# Patient Record
Sex: Female | Born: 1955
Health system: Southern US, Community
[De-identification: ages and names within clinical notes are randomized; demographics above are authoritative.]

## PROBLEM LIST (undated history)

## (undated) ENCOUNTER — Emergency Department (HOSPITAL_COMMUNITY): Disposition: A | Discharge status: Home / Self Care | Payer: Medicare Other

## (undated) DIAGNOSIS — I340 Nonrheumatic mitral (valve) insufficiency: Secondary | ICD-10-CM

## (undated) DIAGNOSIS — K219 Gastro-esophageal reflux disease without esophagitis: Secondary | ICD-10-CM

## (undated) DIAGNOSIS — I10 Essential (primary) hypertension: Secondary | ICD-10-CM

## (undated) DIAGNOSIS — R413 Other amnesia: Secondary | ICD-10-CM

## (undated) DIAGNOSIS — F329 Major depressive disorder, single episode, unspecified: Secondary | ICD-10-CM

## (undated) DIAGNOSIS — I341 Nonrheumatic mitral (valve) prolapse: Secondary | ICD-10-CM

## (undated) DIAGNOSIS — D649 Anemia, unspecified: Secondary | ICD-10-CM

## (undated) DIAGNOSIS — F419 Anxiety disorder, unspecified: Secondary | ICD-10-CM

## (undated) DIAGNOSIS — F319 Bipolar disorder, unspecified: Secondary | ICD-10-CM

## (undated) DIAGNOSIS — S82001A Unspecified fracture of right patella, initial encounter for closed fracture: Secondary | ICD-10-CM

## (undated) DIAGNOSIS — F32A Depression, unspecified: Secondary | ICD-10-CM

## (undated) DIAGNOSIS — M1990 Unspecified osteoarthritis, unspecified site: Secondary | ICD-10-CM

## (undated) DIAGNOSIS — M75100 Unspecified rotator cuff tear or rupture of unspecified shoulder, not specified as traumatic: Secondary | ICD-10-CM

## (undated) HISTORY — DX: Unspecified osteoarthritis, unspecified site: M19.90

## (undated) HISTORY — DX: Nonrheumatic mitral (valve) prolapse: I34.1

## (undated) HISTORY — DX: Anemia, unspecified: D64.9

## (undated) HISTORY — DX: Bipolar disorder, unspecified: F31.9

## (undated) HISTORY — DX: Major depressive disorder, single episode, unspecified: F32.9

## (undated) HISTORY — DX: Essential (primary) hypertension: I10

## (undated) HISTORY — DX: Gastro-esophageal reflux disease without esophagitis: K21.9

## (undated) HISTORY — DX: Anxiety disorder, unspecified: F41.9

## (undated) HISTORY — PX: CARDIOVASCULAR STRESS TEST: SHX262

## (undated) HISTORY — DX: Unspecified rotator cuff tear or rupture of unspecified shoulder, not specified as traumatic: M75.100

## (undated) HISTORY — DX: Depression, unspecified: F32.A

---

## 1998-03-19 ENCOUNTER — Other Ambulatory Visit: Admission: RE | Admit: 1998-03-19 | Discharge: 1998-03-19 | Payer: Self-pay | Admitting: Obstetrics and Gynecology

## 1999-02-06 ENCOUNTER — Emergency Department (HOSPITAL_COMMUNITY): Admission: EM | Admit: 1999-02-06 | Discharge: 1999-02-06 | Payer: Self-pay | Admitting: Emergency Medicine

## 2000-07-17 ENCOUNTER — Encounter: Payer: Self-pay | Admitting: Emergency Medicine

## 2000-07-17 ENCOUNTER — Emergency Department (HOSPITAL_COMMUNITY): Admission: EM | Admit: 2000-07-17 | Discharge: 2000-07-17 | Payer: Self-pay | Admitting: *Deleted

## 2001-05-21 ENCOUNTER — Encounter: Payer: Self-pay | Admitting: Family Medicine

## 2001-05-21 ENCOUNTER — Ambulatory Visit (HOSPITAL_COMMUNITY): Admission: RE | Admit: 2001-05-21 | Discharge: 2001-05-21 | Payer: Self-pay | Admitting: Family Medicine

## 2001-07-18 ENCOUNTER — Emergency Department (HOSPITAL_COMMUNITY): Admission: EM | Admit: 2001-07-18 | Discharge: 2001-07-18 | Payer: Self-pay | Admitting: Emergency Medicine

## 2002-12-15 ENCOUNTER — Other Ambulatory Visit: Admission: RE | Admit: 2002-12-15 | Discharge: 2002-12-15 | Payer: Self-pay | Admitting: Obstetrics and Gynecology

## 2003-08-01 ENCOUNTER — Emergency Department (HOSPITAL_COMMUNITY): Admission: AD | Admit: 2003-08-01 | Discharge: 2003-08-01 | Payer: Self-pay | Admitting: Emergency Medicine

## 2003-09-07 ENCOUNTER — Encounter (INDEPENDENT_AMBULATORY_CARE_PROVIDER_SITE_OTHER): Payer: Self-pay | Admitting: Specialist

## 2003-09-07 ENCOUNTER — Ambulatory Visit (HOSPITAL_COMMUNITY): Admission: RE | Admit: 2003-09-07 | Discharge: 2003-09-07 | Payer: Self-pay | Admitting: Otolaryngology

## 2003-09-07 ENCOUNTER — Ambulatory Visit (HOSPITAL_BASED_OUTPATIENT_CLINIC_OR_DEPARTMENT_OTHER): Admission: RE | Admit: 2003-09-07 | Discharge: 2003-09-07 | Payer: Self-pay | Admitting: Otolaryngology

## 2003-09-07 HISTORY — PX: TONSILLECTOMY: SUR1361

## 2003-09-25 ENCOUNTER — Observation Stay (HOSPITAL_COMMUNITY): Admission: EM | Admit: 2003-09-25 | Discharge: 2003-09-25 | Payer: Self-pay | Admitting: Emergency Medicine

## 2003-09-25 HISTORY — PX: OTHER SURGICAL HISTORY: SHX169

## 2004-03-25 ENCOUNTER — Ambulatory Visit: Payer: Self-pay | Admitting: Internal Medicine

## 2004-04-11 ENCOUNTER — Ambulatory Visit: Payer: Self-pay | Admitting: Internal Medicine

## 2004-05-02 ENCOUNTER — Ambulatory Visit: Payer: Self-pay | Admitting: Internal Medicine

## 2004-11-03 ENCOUNTER — Ambulatory Visit: Payer: Self-pay | Admitting: Internal Medicine

## 2005-03-02 ENCOUNTER — Ambulatory Visit: Payer: Self-pay | Admitting: Internal Medicine

## 2005-07-21 ENCOUNTER — Ambulatory Visit: Payer: Self-pay | Admitting: Family Medicine

## 2005-08-03 ENCOUNTER — Ambulatory Visit: Payer: Self-pay | Admitting: Family Medicine

## 2005-08-10 ENCOUNTER — Encounter: Admission: RE | Admit: 2005-08-10 | Discharge: 2005-08-10 | Payer: Self-pay | Admitting: Family Medicine

## 2005-08-17 ENCOUNTER — Ambulatory Visit: Payer: Self-pay | Admitting: Family Medicine

## 2005-10-11 ENCOUNTER — Ambulatory Visit: Payer: Self-pay | Admitting: Family Medicine

## 2005-11-08 ENCOUNTER — Ambulatory Visit: Payer: Self-pay | Admitting: Family Medicine

## 2005-12-06 ENCOUNTER — Ambulatory Visit: Payer: Self-pay | Admitting: Family Medicine

## 2006-05-26 ENCOUNTER — Inpatient Hospital Stay (HOSPITAL_COMMUNITY): Admission: EM | Admit: 2006-05-26 | Discharge: 2006-05-28 | Payer: Self-pay | Admitting: Emergency Medicine

## 2006-06-11 ENCOUNTER — Ambulatory Visit: Payer: Self-pay

## 2006-07-23 ENCOUNTER — Ambulatory Visit: Payer: Self-pay | Admitting: Cardiovascular Disease

## 2006-08-06 ENCOUNTER — Encounter: Payer: Self-pay | Admitting: Cardiology

## 2006-08-06 ENCOUNTER — Ambulatory Visit: Payer: Self-pay

## 2006-08-09 ENCOUNTER — Ambulatory Visit: Payer: Self-pay | Admitting: Cardiovascular Disease

## 2006-10-27 DIAGNOSIS — F411 Generalized anxiety disorder: Secondary | ICD-10-CM | POA: Insufficient documentation

## 2006-10-27 DIAGNOSIS — M67919 Unspecified disorder of synovium and tendon, unspecified shoulder: Secondary | ICD-10-CM | POA: Insufficient documentation

## 2006-10-27 DIAGNOSIS — F319 Bipolar disorder, unspecified: Secondary | ICD-10-CM | POA: Insufficient documentation

## 2006-10-27 DIAGNOSIS — F17200 Nicotine dependence, unspecified, uncomplicated: Secondary | ICD-10-CM | POA: Insufficient documentation

## 2006-10-27 DIAGNOSIS — M719 Bursopathy, unspecified: Secondary | ICD-10-CM

## 2006-10-27 DIAGNOSIS — M19049 Primary osteoarthritis, unspecified hand: Secondary | ICD-10-CM | POA: Insufficient documentation

## 2006-10-27 DIAGNOSIS — K219 Gastro-esophageal reflux disease without esophagitis: Secondary | ICD-10-CM | POA: Insufficient documentation

## 2006-11-12 ENCOUNTER — Encounter: Admission: RE | Admit: 2006-11-12 | Discharge: 2006-12-13 | Payer: Self-pay | Admitting: Specialist

## 2007-01-21 ENCOUNTER — Emergency Department (HOSPITAL_COMMUNITY): Admission: EM | Admit: 2007-01-21 | Discharge: 2007-01-21 | Payer: Self-pay | Admitting: *Deleted

## 2007-03-25 ENCOUNTER — Ambulatory Visit: Payer: Self-pay

## 2007-03-29 ENCOUNTER — Ambulatory Visit: Payer: Self-pay | Admitting: Cardiovascular Disease

## 2007-12-18 ENCOUNTER — Emergency Department (HOSPITAL_COMMUNITY): Admission: EM | Admit: 2007-12-18 | Discharge: 2007-12-18 | Payer: Self-pay | Admitting: Emergency Medicine

## 2009-04-09 ENCOUNTER — Encounter: Admission: RE | Admit: 2009-04-09 | Discharge: 2009-04-09 | Payer: Self-pay | Admitting: Nurse Practitioner

## 2009-04-15 DIAGNOSIS — Z8679 Personal history of other diseases of the circulatory system: Secondary | ICD-10-CM | POA: Insufficient documentation

## 2009-04-15 DIAGNOSIS — I10 Essential (primary) hypertension: Secondary | ICD-10-CM | POA: Insufficient documentation

## 2009-04-15 DIAGNOSIS — I080 Rheumatic disorders of both mitral and aortic valves: Secondary | ICD-10-CM

## 2009-04-19 ENCOUNTER — Ambulatory Visit: Payer: Self-pay | Admitting: Cardiovascular Disease

## 2009-05-14 ENCOUNTER — Encounter: Admission: RE | Admit: 2009-05-14 | Discharge: 2009-05-14 | Payer: Self-pay | Admitting: Orthopedic Surgery

## 2009-05-18 ENCOUNTER — Telehealth (INDEPENDENT_AMBULATORY_CARE_PROVIDER_SITE_OTHER): Payer: Self-pay | Admitting: *Deleted

## 2009-05-19 ENCOUNTER — Inpatient Hospital Stay (HOSPITAL_COMMUNITY): Admission: RE | Admit: 2009-05-19 | Discharge: 2009-05-26 | Payer: Self-pay | Admitting: Orthopedic Surgery

## 2009-05-19 HISTORY — PX: OTHER SURGICAL HISTORY: SHX169

## 2009-05-19 HISTORY — PX: ANTERIOR CERVICAL DECOMP/DISCECTOMY FUSION: SHX1161

## 2009-05-24 ENCOUNTER — Ambulatory Visit: Payer: Self-pay | Admitting: Physical Medicine & Rehabilitation

## 2009-06-04 ENCOUNTER — Inpatient Hospital Stay (HOSPITAL_COMMUNITY): Admission: EM | Admit: 2009-06-04 | Discharge: 2009-06-07 | Payer: Self-pay | Admitting: Emergency Medicine

## 2009-09-02 ENCOUNTER — Encounter: Admission: RE | Admit: 2009-09-02 | Discharge: 2009-12-01 | Payer: Self-pay | Admitting: Orthopedic Surgery

## 2009-12-02 ENCOUNTER — Encounter
Admission: RE | Admit: 2009-12-02 | Discharge: 2010-01-26 | Payer: Self-pay | Source: Home / Self Care | Admitting: Specialist

## 2010-06-08 ENCOUNTER — Telehealth: Payer: Self-pay | Admitting: Cardiovascular Disease

## 2010-06-15 ENCOUNTER — Ambulatory Visit: Payer: Self-pay | Admitting: Cardiovascular Disease

## 2010-06-16 ENCOUNTER — Ambulatory Visit: Payer: Self-pay

## 2010-06-16 ENCOUNTER — Telehealth: Payer: Self-pay | Admitting: Cardiovascular Disease

## 2010-06-16 ENCOUNTER — Encounter: Payer: Self-pay | Admitting: Cardiovascular Disease

## 2010-06-22 ENCOUNTER — Telehealth: Payer: Self-pay | Admitting: Cardiovascular Disease

## 2010-06-22 ENCOUNTER — Ambulatory Visit (HOSPITAL_COMMUNITY)
Admission: RE | Admit: 2010-06-22 | Discharge: 2010-06-22 | Payer: Self-pay | Source: Home / Self Care | Attending: Cardiology | Admitting: Cardiology

## 2010-06-28 ENCOUNTER — Ambulatory Visit: Admit: 2010-06-28 | Payer: Self-pay | Admitting: Thoracic Surgery (Cardiothoracic Vascular Surgery)

## 2010-06-28 ENCOUNTER — Ambulatory Visit (HOSPITAL_COMMUNITY)
Admission: RE | Admit: 2010-06-28 | Discharge: 2010-06-28 | Payer: Self-pay | Source: Home / Self Care | Attending: Cardiovascular Disease | Admitting: Cardiovascular Disease

## 2010-06-30 ENCOUNTER — Ambulatory Visit: Admit: 2010-06-30 | Payer: Self-pay

## 2010-06-30 ENCOUNTER — Ambulatory Visit (HOSPITAL_COMMUNITY): Admission: RE | Admit: 2010-06-30 | Payer: Self-pay | Source: Home / Self Care | Admitting: Cardiovascular Disease

## 2010-07-07 ENCOUNTER — Ambulatory Visit: Admit: 2010-07-07 | Payer: Self-pay | Admitting: Cardiovascular Disease

## 2010-07-16 ENCOUNTER — Encounter: Payer: Self-pay | Admitting: Family Medicine

## 2010-07-28 NOTE — Progress Notes (Signed)
Summary: when can pt go back to work  Phone Note Call from Patient Call back at [employer] [phone]   Caller: Patient Reason for Call: Talk to Nurse, Talk to Doctor Summary of Call: pt is having a valve repair/replace and she wants to know how long recovery will be she wants to go back to work asap Initial call taken by: Omer Jack,  June 08, 2010 9:01 AM  Follow-up for Phone Call        I spoke with the pt and she is not scheduled for surgery and has not seen a surgeon.  The pt said Dr Mayford Knife told her that she needs to have surgery based off of testing after her spine surgery.  I made the pt aware that our office does not have any records from Dr Mayford Knife or her hospitalizations.  The pt has a scheduled appt with Dr Excell Seltzer on 06/15/10 and I instructed her to obtain her records for Dr Excell Seltzer to review at appt.  I also made the pt aware that Dr Excell Seltzer does not perform valve surgery and that she would be referred to a Cardiac surgeon if she needed further evaluation of her valve.   Follow-up by: Julieta Gutting, RN, BSN,  June 08, 2010 9:23 AM

## 2010-07-28 NOTE — Letter (Signed)
Summary: TEE Instructions  [facility], Main Office  [address]   [place], [place] [address]   Phone: [phone]  Fax: [phone]      TEE Instructions  06/15/2010 MRN: [id number]  Nichole Gibbs [address] [place], [place]  [address]      You are scheduled for a TEE on Thursday June 16, 2010 with Dr. Jens Som.  Please arrive at the [facility] at 12:00 noon on the day of your procedure.  1)   Diet:     A)   May have clear liquid breakfast .  Clear liquids include:  water, broth,        Sprite, Ginger Ale, black cofee, tea (no sugar), cranberry / grape /        apple juice, jello (not red), popsicle from clear juices (not red).  No        solid food after midnight, you can have clear liquids until 7:00 am.  2)  Must have a responsible person to drive you home.  3)   Bring your current insurance cards and current list of all your medications.   *Special Note:  Every effort is made to have your procedure done on time.  Occasionally there are emergencies that present themselves at the hospital that may cause delays.  Please be patient if a delay does occur.  *If you have any questions after you get home, please call the office at [phone].

## 2010-07-28 NOTE — Progress Notes (Signed)
Summary: question regarding office note  Phone Note From Other Clinic   Caller: dee from dr. Barry Dienes office # [phone] Request: Talk with Nurse Summary of Call: has question regarding office note they receive on yesterday.  Initial call taken by: Lorne Skeens,  June 16, 2010 11:20 AM  Follow-up for Phone Call        I talked with PCCs--they are working on referral to Dr Cornelius Moras and it will be made today

## 2010-07-28 NOTE — Assessment & Plan Note (Signed)
Summary: f1y   Visit Type:  Follow-up Primary Provider:  Venita Lick, M.D.  CC:  shortness of breath.  History of Present Illness: 55 year-old woman presents for followup of mitral valve prolapse with mitral regurgitation. She complains of progressive dyspnea with exertion, now with Class 3 symptoms. She reports shortness of breath with walking on level ground at less than 100 feet. Denies orthopnea or PND, but reports shortness of breath lying on her right side. No edema. She has fleeting chest pains.  Her last echo was in 2008 and showed mild MVP with mild MR. LV size and systolic function were normal.  Current Medications (verified): 1)  Wellbutrin Xl 300 Mg Xr24h-Tab (Bupropion Hcl) .... Take 1 Tablet By Mouth Once A Day 2)  Clonazepam 0.5 Mg Tbdp (Clonazepam) .... As Needed 3)  Lortab 5 5-500 Mg Tabs (Hydrocodone-Acetaminophen) .... 4-5 Times A Day 4)  Lyrica 50 Mg Caps (Pregabalin) .Marland Kitchen.. 1 By Mouth Once Daily 5)  Aspirin 325 Mg  Tabs (Aspirin) .Marland Kitchen.. 1 By Mouth Once Daily  Allergies: No Known Drug Allergies  Past History:  Past medical history reviewed for relevance to current acute and chronic problems.  Past Medical History: Reviewed history from 04/19/2009 and no changes required. Current Problems:  HYPERTENSION, UNSPECIFIED (ICD-401.9) MITRAL REGURGITATION (ICD-396.3), mild by echo 2008 MITRAL VALVE PROLAPSE, HX OF (ICD-V12.50) ANXIETY (ICD-300.00) GERD (ICD-530.81) DSORD BIPOLAR I, UNSPC, MOST RECENT EPSD (ICD-296.7) OSTEOARTHRITIS, HANDS, BILATERAL (ICD-715.94) TOBACCO ABUSE (ICD-305.1) SYNDROME, ROTATOR CUFF NOS (ICD-726.10) Chest pain syndrome with negative nuclear stress test 2007  Review of Systems       Negative except as per HPI   Vital Signs:  Patient profile:   55 year old female Height:      62 inches Weight:      117 pounds BMI:     21.48 Pulse rate:   70 / minute Pulse rhythm:   regular Resp:     16 per minute BP sitting:   158 / 102  (left  arm) Cuff size:   regular  Vitals Entered By: Marrion Coy, CNA (June 15, 2010 11:55 AM)  Physical Exam  General:  Pt is alert and oriented, in no acute distress. HEENT: normal Neck: normal carotid upstrokes without bruits, JVP normal Lungs: CTA CV: RRR with a 3/6 holosystolic murmur at the apex Abd: soft, NT, positive BS, no bruit, no organomegaly Ext: no clubbing, cyanosis, or edema. peripheral pulses 2+ and equal Skin: warm and dry without rash    EKG  Procedure date:  06/15/2010  Findings:      NSR 68 bpm, biatrial enlargement.  Impression & Recommendations:  Problem # 1:  MITRAL REGURGITATION (ICD-396.3) The patient has MVP with mitral regurgitation. She has a very strong desire to have her valve repaired as she is fearful of heart failure and other long-term sequelae of MR. She has not had an echo since 2008 - that study was reviewed and showed only mild MR. Her exam is now consistent with at least moderate MR. I have recommended both a 2D echo and a TEE to assess valve morphology. I am going to refer her to Dr Cornelius Moras for discussion of risk/benefit of early repair versus ongoing observation and medical therapy. Her noninvasive studies may show progressive MR and in the presence of exertional dyspnea we would recommend repair. If her MR remains 2+, I would be inclined to continue with med Rx and surveillance.  Orders: Echocardiogram (Echo) TCTS Referral (TCTS Ref) EKG w/ Interpretation ([address])  Trans Esophageal Echocardiogram (TEE)  Problem # 2:  HYPERTENSION, UNSPECIFIED (ICD-401.9)  BP significantly elevated today. She has been intolerant to various meds in the past. Will try losartan 100 mg daily.  Her updated medication list for this problem includes:    Aspirin 325 Mg Tabs (Aspirin) .Marland Kitchen... 1 by mouth once daily    Losartan Potassium 100 Mg Tabs (Losartan potassium) .Marland Kitchen... Take one tablet by mouth daily  Orders: Echocardiogram (Echo) TCTS Referral (TCTS Ref) EKG  w/ Interpretation ([address]) Trans Esophageal Echocardiogram (TEE)  BP today: 158/102 Prior BP: 128/80 (04/19/2009)  Her updated medication list for this problem includes:    Aspirin 325 Mg Tabs (Aspirin) .Marland Kitchen... 1 by mouth once daily    Losartan Potassium 100 Mg Tabs (Losartan potassium) .Marland Kitchen... Take one tablet by mouth daily  Patient Instructions: 1)  Your physician wants you to follow-up in: 1 YEAR.  You will receive a reminder letter in the mail two months in advance. If you don't receive a letter, please call our office to schedule the follow-up appointment. 2)  You have been referred to Dr Cornelius Moras with TCTS.  3)  Your physician has requested that you have an echocardiogram.  Echocardiography is a painless test that uses sound waves to create images of your heart. It provides your doctor with information about the size and shape of your heart and how well your heart's chambers and valves are working.  This procedure takes approximately one hour. There are no restrictions for this procedure. 4)  Your physician has requested that you have a TEE.  During a TEE, sound waves are used to create images of your heart. It provides your doctor with information about the size and shape of your heart and how well your heart's chambers and valves are working. In this test, a transducer is attached to the end of a flexible tube that's guided down your throat and into your esophagus (the tube leading from your mouth to your stomach) to get a more detailed image of your heart. You are not awake for the procedure. Please see the instruction sheet given to you today.  For further information please visit [email or web address] 5)  Your physician has recommended you make the following change in your medication: START Losartan 100mg  once a day Prescriptions: LOSARTAN POTASSIUM 100 MG TABS (LOSARTAN POTASSIUM) take one tablet by mouth daily  #30 x 6   Entered by:   Julieta Gutting, RN, BSN   Authorized by:   Norva Karvonen, MD   Signed by:   Julieta Gutting, RN, BSN on 06/15/2010   Method used:   Electronically to        [facility]. [phone]* (retail)       [address]       [place], [place]  [address]       Ph: [phone]       Fax: [phone]   RxID:   [id number]

## 2010-07-28 NOTE — Progress Notes (Signed)
Summary: pt was not aware of tee for today at [facility]  Phone Note From Other Clinic   Caller: debbie from [facility] [phone] Request: Talk with Nurse Summary of Call: pt was not aware of tee that was scheudle today with dr.crenshaw. pt will need to be r.s  Initial call taken by: Lorne Skeens,  June 22, 2010 11:44 AM  Follow-up for Phone Call        Spoke with Lynden Ang at [facility] regarding the pt. not being aware of having the TEE scheduled for today at 12:00 noon.TEE needs to be re-schedule. According to a written note by Katina Dung, pt. was aware of TEE been done on 06/22/10. When I talked to the pt. she states was confused because, Marlowe Kays the scheduler called her to let her know she was scheduled for an echo on Jan.3rd at 1:00 PM. I let pt. know that  Dr. Excell Seltzer order a TEE and a 2-D echo. Pt. is scheduled to see Dr. Barry Dienes also same day of echo at 3:00PM. Pt. will call back to see when she can get a ride to schedule the TEE.  Follow-up by: Ollen Gross, RN, BSN,  June 22, 2010 12:29 PM  Additional Follow-up for Phone Call Additional follow up Details #1::        The patient needs to have the TEE done before she sees Dr Cornelius Moras. thx Additional Follow-up by: Norva Karvonen, MD,  June 23, 2010 1:53 PM     Appended Document: pt was not aware of tee for today at [facility] pt believes that the tee is scheduled for 1/3 at 1pm but we show the 2d scheduled at that time. did adv pt that needs to have TEE done before seeing Dr. Cornelius Moras. scheduled for 1/3 10:00 am Claris Gladden, RN, BSN    Appended Document: pt was not aware of tee for today at [facility] reviewed 1/3 appointments w/pt and she expressed understanding. 10:00 TEE 1:00  2d 3:00 Dr. Deeann Cree, RN, BSN

## 2010-07-28 NOTE — Letter (Signed)
Summary: TEE Instructions  [facility], Main Office  [address]   [place], [place] [address]   Phone: [phone]  Fax: [phone]      TEE Instructions  06/16/2010 MRN: [id number]  QUANETTA TRUSS [address] [place], [place]  [address]      You are scheduled for a TEE on  Wednesday December 28 with Dr. Olga Millers.   Please arrive at the [facility] at 11 a.m. on the day of your procedure.  1)   Diet:     A)   Nothing to eat or drink after midnight except your medications with        a sip of water.     2)  Must have a responsible person to drive you home.  3)   Bring your current insurance cards and current list of all your medications.   *Special Note:  Every effort is made to have your procedure done on time.  Occasionally there are emergencies that present themselves at the hospital that may cause delays.  Please be patient if a delay does occur.  *If you have any questions after you get home, please call the office at [phone].

## 2010-07-28 NOTE — Progress Notes (Signed)
Summary: pt needs to rsc procedure  Phone Note Call from Patient Call back at Home Phone [phone]   Caller: Patient Reason for Call: Talk to Nurse, Talk to Doctor Summary of Call: pt had to cancel procedure this morning due to pressure being to high so she needs to rsc Initial call taken by: Omer Jack,  June 16, 2010 10:39 AM  Follow-up for Phone Call        I talked with pt--she stated the hospital called her to reschedule to TEE to earlier an earlier time today--pt states she was unable to arrange earlier transportation-she was told the TEE would need to be rescheduled--I have rescheduled the TEE to 06/22/10 at 12n with Dr Daniel Nones to arrive at 11am--Booking # [id number]--YN is aware and verbalized understanding

## 2010-08-18 ENCOUNTER — Telehealth: Payer: Self-pay | Admitting: Cardiovascular Disease

## 2010-08-23 NOTE — Progress Notes (Signed)
Summary: chest pain/l arm pain  Phone Note Call from Patient   Caller: Patient [phone] Reason for Call: Talk to Nurse Summary of Call: pt calling to set up an external [place] prior to having heart surgery, I have no order for that, also while talking with her she was having chest pain-and pain in left arm-she didn't want me to leave this message due to maybe being asked to go to er, she states she took an aspirin, is drinking warm water and is sitting down, but I  felt more comfortable letting you know-pls advise Initial call taken by: Glynda Jaeger,  August 18, 2010 11:18 AM  Follow-up for Phone Call        I spoke with the pt and made her aware that since she had TEE she did not need Echo.  I spoke with the pt about the syptoms she had this morning and she said they had gone away and she felt fine now.  The pt said she had really bad CP, left arm pain, diaphoresis, and her finger tips turned blue.  This lasted about 4-5 minutes.  The pt said she sat down, drank warm water and took an ASA and this gave her relief of symptoms.  The pt's last episode like this was one year ago. I tried to arrange an appt for the pt to be seen tomorrow but she cannot come into the office until March due to financial reasons. The pt also did not want to see the PA.   Appt made on 09/13/10 at 2:30 with Dr Excell Seltzer.  The pt will call the office if she has any other symptoms.  I further instructed the pt to go to the ER for evaluation if symptoms worsen or change. Pt agreed.  Follow-up by: Julieta Gutting, RN, BSN,  August 18, 2010 1:36 PM

## 2010-08-30 ENCOUNTER — Encounter: Payer: Self-pay | Admitting: Cardiovascular Disease

## 2010-09-13 ENCOUNTER — Ambulatory Visit: Payer: Self-pay | Admitting: Cardiovascular Disease

## 2010-09-16 ENCOUNTER — Ambulatory Visit (INDEPENDENT_AMBULATORY_CARE_PROVIDER_SITE_OTHER): Payer: Medicare Other | Admitting: Psychology

## 2010-09-16 DIAGNOSIS — F4310 Post-traumatic stress disorder, unspecified: Secondary | ICD-10-CM

## 2010-09-27 ENCOUNTER — Encounter (INDEPENDENT_AMBULATORY_CARE_PROVIDER_SITE_OTHER): Payer: Medicare Other | Admitting: Psychology

## 2010-09-27 DIAGNOSIS — F4310 Post-traumatic stress disorder, unspecified: Secondary | ICD-10-CM

## 2010-09-28 LAB — URINALYSIS, ROUTINE W REFLEX MICROSCOPIC
Glucose, UA: NEGATIVE mg/dL
Leukocytes, UA: NEGATIVE
Nitrite: NEGATIVE
Specific Gravity, Urine: 1.014 (ref 1.005–1.030)
Urobilinogen, UA: 0.2 mg/dL (ref 0.0–1.0)
pH: 8 (ref 5.0–8.0)

## 2010-09-28 LAB — CBC
HCT: 37.2 % (ref 36.0–46.0)
MCHC: 34.3 g/dL (ref 30.0–36.0)
MCV: 91.8 fL (ref 78.0–100.0)
Platelets: 184 10*3/uL (ref 150–400)
RDW: 14 % (ref 11.5–15.5)
WBC: 4.8 10*3/uL (ref 4.0–10.5)

## 2010-09-28 LAB — BASIC METABOLIC PANEL
BUN: 8 mg/dL (ref 6–23)
CO2: 28 mEq/L (ref 19–32)
Chloride: 107 mEq/L (ref 96–112)
GFR calc Af Amer: >60 mL/min (ref >60)
GFR calc non Af Amer: >60 mL/min (ref >60)
Glucose, Bld: 92 mg/dL (ref 70–99)
Potassium: 4.2 mEq/L (ref 3.5–5.1)

## 2010-09-28 LAB — URINE MICROSCOPIC-ADD ON

## 2010-09-28 LAB — PROTIME-INR
INR: 1.04 (ref 0.00–1.49)
Prothrombin Time: 13.5 seconds (ref 11.6–15.2)

## 2010-10-11 ENCOUNTER — Encounter (HOSPITAL_COMMUNITY): Payer: Medicare Other | Admitting: Psychology

## 2010-11-08 NOTE — Assessment & Plan Note (Signed)
[facility]                            CARDIOLOGY OFFICE NOTE   [name], [name]                   MRN:          [id number]  DATE:03/29/2007                            DOB:          23-Apr-1956    Nichole Gibbs returns for followup at the [facility]iology Office  on March 29, 2007.  She is a 55 year old woman with mitral valve  prolapse and mitral regurgitation.  She has had problems with chest pain  in the past and she was evaluated with a Myoview study in December of  2007 which was normal.  Symptomatically she is doing quite well.  She is  taking atenolol on a sporadic basis.  She has had no further problems  with chest pain or dyspnea.  She has tried to increase her exercise and  she can walk up 6 flights of stairs without stopping.  She has worked up  to this since her last visit.  She is doing this multiple times daily.  She has no symptoms with that level of activity.  She does have some  occasional resting palpitations but this has been better since she has  been on atenolol.   CURRENT MEDICATIONS:  1. Wellbutrin 150 mg daily.  2. Multivitamin daily.  3. Diazepam as needed.  4. Atenolol, unsure of the dose.   ALLERGIES:  NKDA.   PHYSICAL EXAMINATION:  The patient is alert and oriented, she is in no  acute distress.  Weight is 117 pounds, blood pressure 120/70, heart rate  68, respiratory rate 12.  HEENT:  Normal.  NECK:  Normal carotid upstrokes without bruits, jugular venous pressure  is normal.  LUNGS:  Clear to auscultation bilaterally.  HEART:  The apex is discreet and nondisplaced.  The heart is regular  rate and rhythm.  There is a 2/6 mid systolic murmur at the apex.  His  S2 is physiologic.  ABDOMEN:  Soft, nontender, no organomegaly.  EXTREMITIES:  No clubbing, cyanosis or edema.   ASSESSMENT:  Nichole Gibbs is stable from a cardiovascular standpoint.  Her chest pain has resolved.  She has mitral valve  prolapse with mitral  regurgitation.  This is stable by exam.  I will plan on following her on  a yearly basis.  She has had trouble tolerating atenolol on a regular  basis and I have asked her to take 1/2 of a pill every day so that she  stays on a consistent dose.     [name]. Excell Seltzer, MD  Electronically Signed    MDC/MedQ  DD: 03/29/2007  DT: 03/30/2007  Job #: [id number]

## 2010-11-11 NOTE — Assessment & Plan Note (Signed)
[facility]                            CARDIOLOGY OFFICE NOTE   [name], Nichole                   MRN:          [id number]  DATE:07/23/2006                            DOB:          04/16/1956    Nichole Gibbs is a very nice 55 year old African American woman who  presents in-hospital followup after an admission for chest pain back in  December 2007.  Nichole Gibbs presented with chest pain and palpitations  and was ruled out for a myocardial infarction.  She underwent an  outpatient exercise Myoview stress study in our office on December 17 of  last year, which demonstrated normal myocardial perfusion at rest and  with exertion.  The patient's ejection fraction is estimated at 65%.  There was upsloping ST depression that did not make criteria for a  significant finding.   Since discharge Nichole Gibbs has been doing well from a symptomatic  standpoint; however, she has been fearful of doing any exercise, as she  was concerned about her risk for having a heart attack.  She had  previously been engaged in regular exercise with working out for at  least 30 minutes three times a week until her admission in December.  She does have occasional palpitations and has had approximately 2  episodes of palpitations since her discharge home.  She has also had a  few episodes of chest tightness that she relates to anxiety.  She has  had no exertional chest pain.  She complains of mild dyspnea with  exertion, but thinks this is because she is out of shape.  She denies  light headedness, syncope, orthopnea, PND or edema.  She has no other  complaints at this time.   CURRENT MEDICINES:  1. Clonazepam 1 mg b.i.d.  2. Fish oil 1000 units q.i.d.  3. Aspirin 325 mg daily.   She has also been prescribed Niacin and Wellbutrin but has not taken  these medicines.   ALLERGIES:  NKDA.   PAST MEDICAL HISTORY:  Pertinent past medical problems are as  follows:  1. Depression and anxiety disorder.  2. Tobacco abuse.  3. Cesarean section in 1989.  4. Upper GI endoscopy in 1979.   She denies any other surgeries or hospitalizations in the past and has  had no other medical problems.   FAMILY HISTORY:  Patient's mother died at age 65 of complications from  coronary artery disease and congestive heart failure.  Her father died  at age 39 of myocardial infarction.  She has 2 sisters and 2 brothers  with no coronary artery disease.  One sister has severe hypertension.   SOCIAL HISTORY:  The patient is divorced.  She has 3 children.  She has  a 55 year old who lives at home with her.  She smokes 1 pack of  cigarettes daily since age 74.  She drinks approximately 2 beers in the  evening that helps her sleep.  She does not use any recreational drugs.   REVIEW OF SYSTEMS:  A complete 12 point review of systems was performed  Pertinent positives included seasonal allergies, anemia, constipation,  fatigue,  gastroesophageal reflux, occasional hot flashes, anxiety and  depression.  All other systems were reviewed and are negative except as  described above.   PHYSICAL EXAMINATION:  The patient is alert and oriented.  She is in no  acute distress.  Her weight is 117 pounds.  Blood pressure is 98/62.  Heart rate 76.  Respiratory rate is 12.  HEENT:  Normal.  NECK:  Normal carotid upstrokes without bruits.  Jugular venous pressure  is normal.  There is no thyromegaly or thyroid nodules.  LUNGS:  Are clear to auscultation bilaterally.  CARDIAC:  The apex is discrete and nondisplaced.  There is no right  ventricular heave or lift.  The heart is regular rate and rhythm.  There  is physiologic splitting of S2.  There is a 3/6 holosystolic murmur at  the apex.  There are no diastolic murmurs or gallops present.  ABDOMEN:  Is soft, nontender, no abdominal bruits.  No organomegaly.  EXTREMITIES:  No clubbing, cyanosis or edema.  Peripheral pulses are  2+  and equal throughout.  SKIN:  Is warm and dry without rash.  NEUROLOGIC:  Is grossly intact.  Cranial nerves II through XII are  intact.  Strength is 5/5 and equal in the arms and legs bilaterally.   EKG demonstrates normal sinus rhythm with left atrial enlargement.  There are no ST segment or T-wave changes present.   LABORATORY DATA:  Reviewed from her hospitalization demonstrated a  normocytic anemia with a hemoglobin on admission of 12.6 and follow up  hemoglobin of 10.9.  The patient's electrolytes and creatinine were  normal.  Cardiac enzymes were negative x3 sets.  Lipid panel showed a  total cholesterol of 185 with the triglycerides of 30 and HDL  cholesterol of 103 and an LDL cholesterol of 76.   TSH was 1.23.   ASSESSMENT:  Nichole Gibbs is a 55 year old woman with the following  cardiovascular problems:  1. Chest pain.  In reviewing her hospital records as well as her      stress test, she has no evidence of prior myocardial infarction or      myocardial ischemia.  Her stress test results were reassuring.  She      is not having any significant symptoms at this point and I      reassured her in this regard.  I encouraged her to get back to her      regular exercise activities.  She can discontinue aspirin, as I do      not think she requires this medicine for any long term benefit.      Her lipid panel is excellent and it is okay for her to continue      Fish oil but there is no need for her to be on Niacin, as she      actually has a very high HDL cholesterol.  2. Heart murmur. Her murmur sounds consistent with mitral      insufficiency.  I have ordered an      echocardiogram and we will follow up with her after the results of      her echo are complete.  3. Followup.  We will follow up with Nichole Gibbs again after her      echo is complete.     [name]. Excell Seltzer, MD  Electronically Signed   MDC/MedQ  DD: 07/23/2006  DT: 07/23/2006  Job #: [id number]

## 2010-11-11 NOTE — Assessment & Plan Note (Signed)
[facility]                            CARDIOLOGY OFFICE NOTE   Nichole Gibbs, Nichole Gibbs                   MRN:          [id number]  DATE:08/09/2006                            DOB:          11/29/1955    Nichole Gibbs returns for followup on August 09, 2006 as an  outpatient at the [facility].  She is a 55 year old  African-American woman who has had chest pain, palpitations, and  shortness of breath.  She has undergone an exercise Myoview that  demonstrated normal myocardial perfusion at rest and with exertion with  a preserved left ventricular ejection fraction of 65%.  Since her  initial evaluation here on January 28, she has not had any further chest  pain.  She continues to have some dyspnea with exertion.  She has no  other new complaints.   She has undergone a transthoracic echocardiogram, which was done on  February 11.  It demonstrated normal left ventricular size and systolic  function with borderline LVH.  She has mild mitral valve prolapse with  mild to moderate mitral regurgitation.  She also had trivial aortic  insufficiency and trivial tricuspid insufficiency.  There were no other  significant abnormalities seen.   CURRENT MEDICATIONS:  1. Clonazepam 1 mg b.i.d.  2. Fish oil 1000 units q.i.d.  3. Aspirin 325 mg daily.  4. Niacin has been discontinued.  5. Wellbutrin is being titrated for smoking cessation.  6. Daily multivitamin.   ALLERGIES:  NKDA.   PHYSICAL EXAMINATION:  The patient is alert and oriented.  She is in no  acute distress.  Blood pressure is 121/80, heart rate 78, weight 115 pounds.  Respiratory  rate is 12.  HEENT:  Normal.  NECK:  Normal carotid upstrokes without bruits.  Jugular venous pressure  normal.  LUNGS:  Clear to auscultation bilaterally.  HEART:  PMI is discrete and not displaced.  The heart is regular rate  and rhythm.  There is a 2/6 holosystolic murmur at the apex.  There  are  no gallops.  There are no diastolic murmurs.  ABDOMEN:  Soft and nontender.  EXTREMITIES:  No cyanosis, clubbing, or edema.   ASSESSMENT:  Nichole Gibbs is a 55 year old woman with mitral valve  prolapse with mitral regurgitation.  Her mitral valve prolapse may be  the etiology of her chest pain, although that is unclear.  She certainly  is low risk for coronary artery disease with her normal stress Myoview  study and history of atypical symptoms.  I have advise for SBE  prophylaxis for appropriate procedure, such as dental work.  She was  given a prescription for amoxicillin 2 g to be taken 1  hour prior to such procedures.  I have asked her to return to clinic in  6 months for a repeat exam and pending her serial examination, she may  require followup echo in 1 year.     [name]. Excell Seltzer, MD  Electronically Signed    MDC/MedQ  DD: 08/09/2006  DT: 08/09/2006  Job #: [id number]   cc:   Dr. on [facility] [place]
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
## 2010-11-11 NOTE — Discharge Summary (Signed)
[name], Nichole Gibbs          ACCOUNT NO.:  [id number]   MEDICAL RECORD NO.:  [id number]          PATIENT TYPE:  INP   LOCATION:  3711                         FACILITY:  [facility]   PHYSICIAN:  Hettie Holstein, D.O.    DATE OF BIRTH:  1955/08/24   DATE OF ADMISSION:  05/26/2006  DATE OF DISCHARGE:  05/28/2006                               DISCHARGE SUMMARY   PRIMARY CARE PHYSICIAN:  Dr. Hal Hope at [facility].   REASON FOR ADMISSION PRINCIPAL DIAGNOSIS:  Chest pain, no evidence of  acute event per cardiac markers with EKG findings of normal sinus rhythm  and left ventricular hypertrophy.   SECONDARY DIAGNOSES:  1. Anxiety.  2. History of bipolar disorder.  3. History of tobacco abuse.   MEDICATIONS ON DISCHARGE:  1. Patient is being discharged on aspirin 81 mg per day.  2. She is instructed to take Prilosec over-the-counter 20 mg daily.  3. She was formerly prescribed Nexium; however, was unable to continue      for insurance reasons.  4. Lamictal 100 mg per day.  5. Klonopin 1 mg p.o. t.i.d. p.r.n.   DISPOSITION:  At present [name] is felt to be medically stable  for discharge home.  Her symptoms have resolved, she is chest pain free.   She is instructed to follow up with [facility] for a stress  test, this is currently being arranged through [place] at this time.  She is instructed to follow up with Dr. Hal Hope at [facility] this  week for hospital follow up and instructed to call and counseled on  tobacco cessation.   HISTORY OF PRESENTING ILLNESS:  For full details, please refer to the  H&P as dictated by Dr. Oneita Hurt.  However, briefly, [name] is a  55 year old female with a history of bipolar disorder, tobacco abuse who  developed some exertional type chest discomfort, pain, left sided, that  was associated with recent escalation of stress and anxiety as she has  recently attended her mother's funeral.  In any event, she does exercise  regularly and on a routine, had been in her usual state of health.  She  was admitted to rule out acute ischemic event.  In the Emergency  Department her EKG revealed no evidence of ST abnormalities.   HOSPITAL COURSE:  The patient was admitted, her cardiac markers were  cycled as noted above.  She was followed on telemetry without evidence  of arrhythmia.  She underwent cycling of her cardiac markers which  revealed no evidence of acute ischemic event.  Her symptoms resolved and  at this time it is felt that she was stable for discharge and follow up  with [facility] for a stress test evaluation through her primary  care physician.  It may be reasonable to want to pursue a 2D  echocardiogram, as her EKG revealed LVH.      Hettie Holstein, D.O.  Electronically Signed     ESS/MEDQ  D:  05/28/2006  T:  05/28/2006  Job:  [id number]   cc:   Hal Hope, Dr.  [facility]

## 2010-11-11 NOTE — Op Note (Signed)
NAME:  Nichole Gibbs, Nichole Gibbs                      ACCOUNT NO.:  [id number]   MEDICAL RECORD NO.:  [id number]                   PATIENT TYPE:  INP   LOCATION:  1826                                 FACILITY:  [facility]   PHYSICIAN:  Kinnie Scales. Annalee Genta, M.D.            DATE OF BIRTH:  16-May-1956   DATE OF PROCEDURE:  09/25/2003  DATE OF DISCHARGE:  09/25/2003                                 OPERATIVE REPORT   PREOPERATIVE DIAGNOSES:  1. Posttonsillectomy hemorrhage.  2. Status post tonsillectomy.   POSTOPERATIVE DIAGNOSES:  1. Posttonsillectomy hemorrhage.  2. Status post tonsillectomy.   INDICATIONS FOR SURGERY:  1. Posttonsillectomy hemorrhage.  2. Status post tonsillectomy.   SURGICAL PROCEDURE:  1. Examination and cautery of posttonsillectomy hemorrhage.  2. Gastric lavage.   ANESTHESIA:  General endotracheal.   SURGEON:  Kinnie Scales. Annalee Genta, M.D.   COMPLICATIONS:  None.   ESTIMATED BLOOD LOSS:  Approximately 100 cc.   The patient was transferred from the operating room to the recovery room in  stable condition.   BRIEF HISTORY:  Ms. Barbuto is a 55 year old black female who underwent  tonsillectomy by Dr. Flo Shanks approximately three weeks ago.  She was  stable and doing well, returned to work and normal diet.  She developed  acute tonsillar hemorrhage in the evening of 09/25/03.  She presented to the  [facility] emergency department for evaluation, and given the acute  nature of her hemorrhage was taken directly to the operating room for  examination and cautery of posttonsillectomy hemorrhage site.  Prior to  surgery, the risks, benefits and possible complications of the procedure  were discussed in detail, and the patient understood and concurred with our  plan for surgery.   PROCEDURE:  The patient was brought to the operating room on 09/25/03 and  placed in the supine position on the operating table.  General endotracheal  anesthesia was established  via rapid induction technique without  complication.  There was no aspiration of blood.  With the patient's airway  secured, a Crowe-Davis mouth gag was inserted without difficulty. The  patient's oral cavity was suctioned of blood and she was found to have  active acute hemorrhage from an arterial blood vessel in the right inferior  tonsillar fossa.  The area was cauterized with suction cautery.  Tonsillar  fossa then gently abraded with a dry tonsil sponge, areas of point  hemorrhage were cauterized.  The patient's oral cavity and oropharynx were  thoroughly irrigated with saline solution.  The Crow-Davis mouth gag was  released and reapplied and there was no active bleeding.   Gastric lavage was then undertaken.  A 14 French orogastric tube was passed  without difficulty.  A moderate amount of old, bloody secretions were  suctioned from the stomach and the patient's stomach was then lavaged with  approximately 300 cc of warm saline until no further blood clots were  evident.  Orogastric tube was removed.  The patient's oral cavity and  oropharynx were again inspected. There was no active bleeding.  She was  awakened from her anesthetic, Crowe-Davis mouth gag was released and  removed, and she was extubated.  She was then transferred from the operating  room to the recovery room in stable condition.  No complications.  Estimated  blood loss approximately 100 cc.                                               Kinnie Scales. Annalee Genta, M.D.    DLS/MEDQ  D:  16/03/9603  T:  09/26/2003  Job:  [id number]

## 2010-11-11 NOTE — Op Note (Signed)
[name], Nichole Gibbs                      ACCOUNT NO.:  [id number]   MEDICAL RECORD NO.:  [id number]                   PATIENT TYPE:  AMB   LOCATION:  DSC                                  FACILITY:  [facility]   PHYSICIAN:  Zola Button T. Lazarus Salines, M.D.              DATE OF BIRTH:  1956/05/13   DATE OF PROCEDURE:  09/07/2003  DATE OF DISCHARGE:                                 OPERATIVE REPORT   PREOPERATIVE DIAGNOSES:  1. Halitosis.  2. Question chronic tonsillitis.   POSTOPERATIVE DIAGNOSES:  1. Halitosis.  2. Question chronic tonsillitis.   PROCEDURE PERFORMED:  Tonsillectomy.   SURGEON:  Gloris Manchester. Lazarus Salines, M.D.   ANESTHESIA:  General orotracheal.   ESTIMATED BLOOD LOSS:  Minimal.   COMPLICATIONS:  None.   FINDINGS:  Relatively clear anterior nose.  No residual adenoids.  Normal  soft palate.  1+ embedded, fibrotic tonsils with some cryptic debris.   PROCEDURE:  With the patient in a comfortable supine position, general  orotracheal anesthesia was induced without difficulty.  At an appropriate  level, the table was turned 90 degrees and the patient placed in  Trendelenburg.  A clean preparation and draping was accomplished.  Taking  care to protect lips, teeth, and endotracheal tube, a McIvor mouth gag was  introduced, expanded for visualization, and suspended from the Mayo stand in  the standard fashion.  The findings were as described above.  A palate  retractor and mirror were used to visualize the nasopharynx with the  findings as described above.  Finally the anterior nose was inspected with  the nasal speculum with the findings as described above.  Xylocaine 0.5%  with 1:200,000 epinephrine, 6 mL total, was infiltrated into the  peritonsillar planes for intraoperative hemostasis.  Several minutes were  allowed for this to take effect.   Beginning on the left side, the tonsil was grasped and retracted medially.  The mucosa overlying the anterior and superior poles was  coagulated and then  cut down to the capsule of the tonsil.  Using the cautery tip as a blunt  dissector, lysing fibrous bands, and coagulating crossing vessels as  identified, the tonsil was dissected free of its muscular fossa from  superiorly downward.  The tonsil was removed in its entirety as determined  by examination of both tonsil and fossa.  A small additional quantity of  cautery rendered the fossa hemostatic.  After completing the left  tonsillectomy, the right side was done in identical fashion.  After  completing both tonsillectomies and rendering the oropharynx hemostatic, the  mouth gag was relaxed for several minutes.  Upon re-expansion, hemostasis  was persistent.  At this point the procedure was completed.  The palate  retractor was relaxed and removed.  The dental status was intact.  The  patient was returned to anesthesia, awakened, extubated, and transferred to  recovery in stable condition.   COMMENT:  A 56 year old black female with persistent  halitosis and small  cryptic tonsils was the indication for today's procedure.  Anticipate a  routine postoperative recovery with attention to analgesia, antibiosis,  hydration, and observation for bleeding, emesis, or airway compromise.  Given low anticipated risk of postanesthetic or postsurgical complications,  feel an outpatient venue is appropriate.                                               Gloris Manchester. Lazarus Salines, M.D.    KTW/MEDQ  D:  09/07/2003  T:  09/07/2003  Job:  [id number]   cc:   Lorelle Formosa, M.D.  [phone] [address]  [place]  [place] [address]  Fax: [phone]

## 2011-02-07 ENCOUNTER — Other Ambulatory Visit: Payer: Self-pay | Admitting: Specialist

## 2011-02-07 ENCOUNTER — Ambulatory Visit: Payer: Medicare Other

## 2011-02-07 DIAGNOSIS — Z1231 Encounter for screening mammogram for malignant neoplasm of breast: Secondary | ICD-10-CM

## 2011-03-15 ENCOUNTER — Ambulatory Visit
Admission: RE | Admit: 2011-03-15 | Discharge: 2011-03-15 | Disposition: A | Discharge status: Home / Self Care | Payer: Medicare Other | Source: Ambulatory Visit | Attending: Specialist | Admitting: Specialist

## 2011-03-15 DIAGNOSIS — Z1231 Encounter for screening mammogram for malignant neoplasm of breast: Secondary | ICD-10-CM

## 2011-03-30 ENCOUNTER — Emergency Department (HOSPITAL_COMMUNITY)
Admission: EM | Admit: 2011-03-30 | Discharge: 2011-03-30 | Disposition: A | Discharge status: Home / Self Care | Payer: Medicare Other | Attending: Emergency Medicine | Admitting: Emergency Medicine

## 2011-03-30 DIAGNOSIS — I10 Essential (primary) hypertension: Secondary | ICD-10-CM | POA: Insufficient documentation

## 2011-03-30 DIAGNOSIS — Z79899 Other long term (current) drug therapy: Secondary | ICD-10-CM | POA: Insufficient documentation

## 2011-03-30 DIAGNOSIS — R4589 Other symptoms and signs involving emotional state: Secondary | ICD-10-CM | POA: Insufficient documentation

## 2011-03-30 DIAGNOSIS — IMO0002 Reserved for concepts with insufficient information to code with codable children: Secondary | ICD-10-CM | POA: Insufficient documentation

## 2011-03-30 DIAGNOSIS — I059 Rheumatic mitral valve disease, unspecified: Secondary | ICD-10-CM | POA: Insufficient documentation

## 2011-03-30 DIAGNOSIS — Z760 Encounter for issue of repeat prescription: Secondary | ICD-10-CM | POA: Insufficient documentation

## 2011-04-10 LAB — POCT CARDIAC MARKERS
CKMB, poc: <1 — ABNORMAL LOW
Myoglobin, poc: 68.7
Operator id: 151321
Troponin i, poc: <0.05

## 2011-04-10 LAB — POCT I-STAT CREATININE
Creatinine, Ser: 0.9
Operator id: 151321

## 2011-04-10 LAB — I-STAT 8, (EC8 V) (CONVERTED LAB)
BUN: 7
Bicarbonate: 32.2 — ABNORMAL HIGH
Chloride: 101
Glucose, Bld: 91
HCT: 45
Operator id: 151321
pCO2, Ven: 53.3 — ABNORMAL HIGH

## 2011-04-10 LAB — D-DIMER, QUANTITATIVE

## 2011-05-19 ENCOUNTER — Other Ambulatory Visit: Payer: Self-pay | Admitting: Cardiovascular Disease

## 2011-07-13 DIAGNOSIS — R112 Nausea with vomiting, unspecified: Secondary | ICD-10-CM | POA: Diagnosis not present

## 2011-07-13 DIAGNOSIS — R5381 Other malaise: Secondary | ICD-10-CM | POA: Diagnosis not present

## 2011-07-13 DIAGNOSIS — R5383 Other fatigue: Secondary | ICD-10-CM | POA: Diagnosis not present

## 2011-07-13 DIAGNOSIS — IMO0002 Reserved for concepts with insufficient information to code with codable children: Secondary | ICD-10-CM | POA: Diagnosis not present

## 2011-07-13 DIAGNOSIS — G8929 Other chronic pain: Secondary | ICD-10-CM | POA: Diagnosis not present

## 2011-07-19 DIAGNOSIS — F339 Major depressive disorder, recurrent, unspecified: Secondary | ICD-10-CM | POA: Diagnosis not present

## 2011-07-31 DIAGNOSIS — F339 Major depressive disorder, recurrent, unspecified: Secondary | ICD-10-CM | POA: Diagnosis not present

## 2011-08-14 DIAGNOSIS — F339 Major depressive disorder, recurrent, unspecified: Secondary | ICD-10-CM | POA: Diagnosis not present

## 2011-08-15 DIAGNOSIS — R5383 Other fatigue: Secondary | ICD-10-CM | POA: Diagnosis not present

## 2011-08-15 DIAGNOSIS — G8929 Other chronic pain: Secondary | ICD-10-CM | POA: Diagnosis not present

## 2011-08-15 DIAGNOSIS — IMO0002 Reserved for concepts with insufficient information to code with codable children: Secondary | ICD-10-CM | POA: Diagnosis not present

## 2011-08-15 DIAGNOSIS — R5381 Other malaise: Secondary | ICD-10-CM | POA: Diagnosis not present

## 2011-08-28 DIAGNOSIS — F339 Major depressive disorder, recurrent, unspecified: Secondary | ICD-10-CM | POA: Diagnosis not present

## 2011-09-08 DIAGNOSIS — F339 Major depressive disorder, recurrent, unspecified: Secondary | ICD-10-CM | POA: Diagnosis not present

## 2011-09-12 DIAGNOSIS — I10 Essential (primary) hypertension: Secondary | ICD-10-CM | POA: Diagnosis not present

## 2011-09-12 DIAGNOSIS — M129 Arthropathy, unspecified: Secondary | ICD-10-CM | POA: Diagnosis not present

## 2011-09-12 DIAGNOSIS — R5381 Other malaise: Secondary | ICD-10-CM | POA: Diagnosis not present

## 2011-09-12 DIAGNOSIS — G8929 Other chronic pain: Secondary | ICD-10-CM | POA: Diagnosis not present

## 2011-09-18 DIAGNOSIS — F339 Major depressive disorder, recurrent, unspecified: Secondary | ICD-10-CM | POA: Diagnosis not present

## 2011-09-28 DIAGNOSIS — H40019 Open angle with borderline findings, low risk, unspecified eye: Secondary | ICD-10-CM | POA: Diagnosis not present

## 2011-10-06 DIAGNOSIS — F339 Major depressive disorder, recurrent, unspecified: Secondary | ICD-10-CM | POA: Diagnosis not present

## 2011-10-13 DIAGNOSIS — I10 Essential (primary) hypertension: Secondary | ICD-10-CM | POA: Diagnosis not present

## 2011-10-13 DIAGNOSIS — Z79899 Other long term (current) drug therapy: Secondary | ICD-10-CM | POA: Diagnosis not present

## 2011-10-13 DIAGNOSIS — G8929 Other chronic pain: Secondary | ICD-10-CM | POA: Diagnosis not present

## 2011-10-13 DIAGNOSIS — Z5181 Encounter for therapeutic drug level monitoring: Secondary | ICD-10-CM | POA: Diagnosis not present

## 2011-10-20 DIAGNOSIS — F339 Major depressive disorder, recurrent, unspecified: Secondary | ICD-10-CM | POA: Diagnosis not present

## 2011-11-09 DIAGNOSIS — Z5181 Encounter for therapeutic drug level monitoring: Secondary | ICD-10-CM | POA: Diagnosis not present

## 2011-11-09 DIAGNOSIS — I10 Essential (primary) hypertension: Secondary | ICD-10-CM | POA: Diagnosis not present

## 2011-11-09 DIAGNOSIS — R5383 Other fatigue: Secondary | ICD-10-CM | POA: Diagnosis not present

## 2011-11-09 DIAGNOSIS — R5381 Other malaise: Secondary | ICD-10-CM | POA: Diagnosis not present

## 2011-11-09 DIAGNOSIS — Z79899 Other long term (current) drug therapy: Secondary | ICD-10-CM | POA: Diagnosis not present

## 2011-11-21 DIAGNOSIS — H409 Unspecified glaucoma: Secondary | ICD-10-CM | POA: Diagnosis not present

## 2011-11-21 DIAGNOSIS — H4011X0 Primary open-angle glaucoma, stage unspecified: Secondary | ICD-10-CM | POA: Diagnosis not present

## 2011-12-12 DIAGNOSIS — F339 Major depressive disorder, recurrent, unspecified: Secondary | ICD-10-CM | POA: Diagnosis not present

## 2011-12-14 DIAGNOSIS — G609 Hereditary and idiopathic neuropathy, unspecified: Secondary | ICD-10-CM | POA: Diagnosis not present

## 2011-12-14 DIAGNOSIS — G8929 Other chronic pain: Secondary | ICD-10-CM | POA: Diagnosis not present

## 2011-12-14 DIAGNOSIS — IMO0002 Reserved for concepts with insufficient information to code with codable children: Secondary | ICD-10-CM | POA: Diagnosis not present

## 2011-12-14 DIAGNOSIS — I10 Essential (primary) hypertension: Secondary | ICD-10-CM | POA: Diagnosis not present

## 2012-01-11 DIAGNOSIS — M25519 Pain in unspecified shoulder: Secondary | ICD-10-CM | POA: Diagnosis not present

## 2012-01-11 DIAGNOSIS — R5383 Other fatigue: Secondary | ICD-10-CM | POA: Diagnosis not present

## 2012-01-11 DIAGNOSIS — G8929 Other chronic pain: Secondary | ICD-10-CM | POA: Diagnosis not present

## 2012-02-02 DIAGNOSIS — M542 Cervicalgia: Secondary | ICD-10-CM | POA: Diagnosis not present

## 2012-02-02 DIAGNOSIS — M25519 Pain in unspecified shoulder: Secondary | ICD-10-CM | POA: Diagnosis not present

## 2012-02-08 DIAGNOSIS — R5383 Other fatigue: Secondary | ICD-10-CM | POA: Diagnosis not present

## 2012-02-08 DIAGNOSIS — G8929 Other chronic pain: Secondary | ICD-10-CM | POA: Diagnosis not present

## 2012-02-08 DIAGNOSIS — IMO0002 Reserved for concepts with insufficient information to code with codable children: Secondary | ICD-10-CM | POA: Diagnosis not present

## 2012-02-08 DIAGNOSIS — M129 Arthropathy, unspecified: Secondary | ICD-10-CM | POA: Diagnosis not present

## 2012-02-21 DIAGNOSIS — F339 Major depressive disorder, recurrent, unspecified: Secondary | ICD-10-CM | POA: Diagnosis not present

## 2012-02-21 DIAGNOSIS — M25519 Pain in unspecified shoulder: Secondary | ICD-10-CM | POA: Diagnosis not present

## 2012-03-06 DIAGNOSIS — F339 Major depressive disorder, recurrent, unspecified: Secondary | ICD-10-CM | POA: Diagnosis not present

## 2012-03-14 DIAGNOSIS — R1084 Generalized abdominal pain: Secondary | ICD-10-CM | POA: Diagnosis not present

## 2012-03-14 DIAGNOSIS — R5381 Other malaise: Secondary | ICD-10-CM | POA: Diagnosis not present

## 2012-03-14 DIAGNOSIS — G8929 Other chronic pain: Secondary | ICD-10-CM | POA: Diagnosis not present

## 2012-03-14 DIAGNOSIS — R5383 Other fatigue: Secondary | ICD-10-CM | POA: Diagnosis not present

## 2012-03-14 DIAGNOSIS — I10 Essential (primary) hypertension: Secondary | ICD-10-CM | POA: Diagnosis not present

## 2012-04-03 DIAGNOSIS — M25519 Pain in unspecified shoulder: Secondary | ICD-10-CM | POA: Diagnosis not present

## 2012-04-09 DIAGNOSIS — M25519 Pain in unspecified shoulder: Secondary | ICD-10-CM | POA: Diagnosis not present

## 2012-04-12 DIAGNOSIS — R5381 Other malaise: Secondary | ICD-10-CM | POA: Diagnosis not present

## 2012-04-12 DIAGNOSIS — I10 Essential (primary) hypertension: Secondary | ICD-10-CM | POA: Diagnosis not present

## 2012-04-12 DIAGNOSIS — G4700 Insomnia, unspecified: Secondary | ICD-10-CM | POA: Diagnosis not present

## 2012-04-12 DIAGNOSIS — G8929 Other chronic pain: Secondary | ICD-10-CM | POA: Diagnosis not present

## 2012-04-12 DIAGNOSIS — R5383 Other fatigue: Secondary | ICD-10-CM | POA: Diagnosis not present

## 2012-04-22 DIAGNOSIS — M25519 Pain in unspecified shoulder: Secondary | ICD-10-CM | POA: Diagnosis not present

## 2012-05-01 DIAGNOSIS — F339 Major depressive disorder, recurrent, unspecified: Secondary | ICD-10-CM | POA: Diagnosis not present

## 2012-05-13 DIAGNOSIS — G8929 Other chronic pain: Secondary | ICD-10-CM | POA: Diagnosis not present

## 2012-05-13 DIAGNOSIS — R5383 Other fatigue: Secondary | ICD-10-CM | POA: Diagnosis not present

## 2012-05-13 DIAGNOSIS — S43429A Sprain of unspecified rotator cuff capsule, initial encounter: Secondary | ICD-10-CM | POA: Diagnosis not present

## 2012-05-13 DIAGNOSIS — IMO0002 Reserved for concepts with insufficient information to code with codable children: Secondary | ICD-10-CM | POA: Diagnosis not present

## 2012-05-13 DIAGNOSIS — R5381 Other malaise: Secondary | ICD-10-CM | POA: Diagnosis not present

## 2012-05-28 ENCOUNTER — Emergency Department (HOSPITAL_COMMUNITY): Payer: Medicare Other

## 2012-05-28 ENCOUNTER — Encounter (HOSPITAL_COMMUNITY): Payer: Self-pay | Admitting: Emergency Medicine

## 2012-05-28 ENCOUNTER — Emergency Department (HOSPITAL_COMMUNITY)
Admission: EM | Admit: 2012-05-28 | Discharge: 2012-05-28 | Disposition: A | Discharge status: Home / Self Care | Payer: Medicare Other | Attending: Emergency Medicine | Admitting: Emergency Medicine

## 2012-05-28 DIAGNOSIS — Z79899 Other long term (current) drug therapy: Secondary | ICD-10-CM | POA: Diagnosis not present

## 2012-05-28 DIAGNOSIS — M542 Cervicalgia: Secondary | ICD-10-CM | POA: Diagnosis not present

## 2012-05-28 DIAGNOSIS — S46909A Unspecified injury of unspecified muscle, fascia and tendon at shoulder and upper arm level, unspecified arm, initial encounter: Secondary | ICD-10-CM | POA: Insufficient documentation

## 2012-05-28 DIAGNOSIS — I10 Essential (primary) hypertension: Secondary | ICD-10-CM | POA: Diagnosis not present

## 2012-05-28 DIAGNOSIS — S4980XA Other specified injuries of shoulder and upper arm, unspecified arm, initial encounter: Secondary | ICD-10-CM | POA: Diagnosis not present

## 2012-05-28 DIAGNOSIS — IMO0002 Reserved for concepts with insufficient information to code with codable children: Secondary | ICD-10-CM | POA: Diagnosis not present

## 2012-05-28 DIAGNOSIS — R42 Dizziness and giddiness: Secondary | ICD-10-CM | POA: Diagnosis not present

## 2012-05-28 DIAGNOSIS — M546 Pain in thoracic spine: Secondary | ICD-10-CM | POA: Diagnosis not present

## 2012-05-28 DIAGNOSIS — F319 Bipolar disorder, unspecified: Secondary | ICD-10-CM | POA: Insufficient documentation

## 2012-05-28 DIAGNOSIS — Z8719 Personal history of other diseases of the digestive system: Secondary | ICD-10-CM | POA: Insufficient documentation

## 2012-05-28 DIAGNOSIS — S0993XA Unspecified injury of face, initial encounter: Secondary | ICD-10-CM | POA: Diagnosis not present

## 2012-05-28 DIAGNOSIS — F411 Generalized anxiety disorder: Secondary | ICD-10-CM | POA: Diagnosis not present

## 2012-05-28 DIAGNOSIS — Z8739 Personal history of other diseases of the musculoskeletal system and connective tissue: Secondary | ICD-10-CM | POA: Diagnosis not present

## 2012-05-28 DIAGNOSIS — I059 Rheumatic mitral valve disease, unspecified: Secondary | ICD-10-CM | POA: Diagnosis not present

## 2012-05-28 DIAGNOSIS — F17200 Nicotine dependence, unspecified, uncomplicated: Secondary | ICD-10-CM | POA: Insufficient documentation

## 2012-05-28 MED ORDER — LORAZEPAM 1 MG PO TABS: 1.0000 mg | ORAL_TABLET | Freq: Three times a day (TID) | ORAL | Status: DC | PRN

## 2012-05-28 MED ORDER — ACETAMINOPHEN 325 MG PO TABS: 975.0000 mg | ORAL_TABLET | Freq: Once | ORAL | Status: DC

## 2012-05-28 MED ORDER — LORAZEPAM 1 MG PO TABS: 2.0000 mg | ORAL_TABLET | Freq: Once | ORAL | Status: DC

## 2012-05-28 MED ORDER — HYDROCODONE-ACETAMINOPHEN 5-325 MG PO TABS
2.0000 | ORAL_TABLET | Freq: Once | ORAL | Status: AC
  Administered 2012-05-28: 2 via ORAL
  Filled 2012-05-28: qty 2

## 2012-05-28 MED ORDER — CYCLOBENZAPRINE HCL 10 MG PO TABS: 10.0000 mg | ORAL_TABLET | Freq: Two times a day (BID) | ORAL | Status: DC | PRN

## 2012-05-28 MED ORDER — LORAZEPAM 1 MG PO TABS
2.0000 mg | ORAL_TABLET | Freq: Once | ORAL | Status: AC
  Administered 2012-05-28: 2 mg via ORAL
  Filled 2012-05-28: qty 2

## 2012-05-28 NOTE — ED Notes (Signed)
Patient transported to X-ray 
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
## 2012-05-28 NOTE — Progress Notes (Signed)
Orthopedic Tech Progress Note Patient Details:  Nichole Gibbs 07/18/1955 [id number]  Ortho Devices Type of Ortho Device: Arm sling Ortho Device/Splint Location: (L) UE Ortho Device/Splint Interventions: Application   Jennye Moccasin 05/28/2012, 10:51 PM
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
## 2012-05-31 NOTE — ED Provider Notes (Signed)
Medical screening examination/treatment/procedure(s) were performed by non-physician practitioner and as supervising physician I was immediately available for consultation/collaboration.   Kyliee Ortego B. Bernette Mayers, MD 05/31/12 [phone]

## 2012-06-07 DIAGNOSIS — F339 Major depressive disorder, recurrent, unspecified: Secondary | ICD-10-CM | POA: Diagnosis not present

## 2012-06-11 DIAGNOSIS — G8929 Other chronic pain: Secondary | ICD-10-CM | POA: Diagnosis not present

## 2012-06-11 DIAGNOSIS — R5381 Other malaise: Secondary | ICD-10-CM | POA: Diagnosis not present

## 2012-06-11 DIAGNOSIS — L259 Unspecified contact dermatitis, unspecified cause: Secondary | ICD-10-CM | POA: Diagnosis not present

## 2012-06-11 DIAGNOSIS — N760 Acute vaginitis: Secondary | ICD-10-CM | POA: Diagnosis not present

## 2012-06-11 DIAGNOSIS — Z79899 Other long term (current) drug therapy: Secondary | ICD-10-CM | POA: Diagnosis not present

## 2012-06-11 DIAGNOSIS — R5383 Other fatigue: Secondary | ICD-10-CM | POA: Diagnosis not present

## 2012-06-11 DIAGNOSIS — Z5181 Encounter for therapeutic drug level monitoring: Secondary | ICD-10-CM | POA: Diagnosis not present

## 2012-06-19 ENCOUNTER — Telehealth: Payer: Self-pay | Admitting: Cardiology

## 2012-06-19 NOTE — Telephone Encounter (Signed)
Returned a call to Ms. Perra. She stated that she thinks she had a heart attack on Monday morning around 4:30 am. She woke up with chest pain for which she took two aspirin and then went back to sleep. Today her SBP has been in the 160-170s despite taking her BP medication. She denies chest pain at this time. I advised her to seek medical attention immediately. She stated understanding.  [place], PA-C 06/19/2012 3:15 PM

## 2012-06-23 ENCOUNTER — Encounter (HOSPITAL_COMMUNITY): Payer: Self-pay | Admitting: *Deleted

## 2012-06-23 ENCOUNTER — Emergency Department (HOSPITAL_COMMUNITY)
Admission: EM | Admit: 2012-06-23 | Discharge: 2012-06-23 | Disposition: A | Discharge status: Home / Self Care | Payer: Medicare Other | Attending: Emergency Medicine | Admitting: Emergency Medicine

## 2012-06-23 ENCOUNTER — Emergency Department (HOSPITAL_COMMUNITY): Payer: Medicare Other

## 2012-06-23 DIAGNOSIS — R079 Chest pain, unspecified: Secondary | ICD-10-CM | POA: Insufficient documentation

## 2012-06-23 DIAGNOSIS — F411 Generalized anxiety disorder: Secondary | ICD-10-CM | POA: Insufficient documentation

## 2012-06-23 DIAGNOSIS — Z8679 Personal history of other diseases of the circulatory system: Secondary | ICD-10-CM | POA: Insufficient documentation

## 2012-06-23 DIAGNOSIS — Z8739 Personal history of other diseases of the musculoskeletal system and connective tissue: Secondary | ICD-10-CM | POA: Insufficient documentation

## 2012-06-23 DIAGNOSIS — K219 Gastro-esophageal reflux disease without esophagitis: Secondary | ICD-10-CM | POA: Diagnosis not present

## 2012-06-23 DIAGNOSIS — R5381 Other malaise: Secondary | ICD-10-CM | POA: Insufficient documentation

## 2012-06-23 DIAGNOSIS — R072 Precordial pain: Secondary | ICD-10-CM | POA: Diagnosis not present

## 2012-06-23 DIAGNOSIS — F17200 Nicotine dependence, unspecified, uncomplicated: Secondary | ICD-10-CM | POA: Diagnosis not present

## 2012-06-23 DIAGNOSIS — I10 Essential (primary) hypertension: Secondary | ICD-10-CM | POA: Insufficient documentation

## 2012-06-23 DIAGNOSIS — F3160 Bipolar disorder, current episode mixed, unspecified: Secondary | ICD-10-CM | POA: Diagnosis not present

## 2012-06-23 LAB — BASIC METABOLIC PANEL
Chloride: 105 mEq/L (ref 96–112)
GFR calc Af Amer: >90 mL/min (ref >90)
GFR calc non Af Amer: >90 mL/min (ref >90)
Glucose, Bld: 104 mg/dL — ABNORMAL HIGH (ref 70–99)
Potassium: 4 mEq/L (ref 3.5–5.1)
Sodium: 139 mEq/L (ref 135–145)

## 2012-06-23 LAB — TROPONIN I: Troponin I: <0.3 ng/mL (ref <0.30)

## 2012-06-23 LAB — CBC
Hemoglobin: 12.6 g/dL (ref 12.0–15.0)
MCHC: 34.2 g/dL (ref 30.0–36.0)
RDW: 14.3 % (ref 11.5–15.5)
WBC: 4.8 10*3/uL (ref 4.0–10.5)

## 2012-06-23 LAB — D-DIMER, QUANTITATIVE: D-Dimer, Quant: 1.45 ug/mL-FEU — ABNORMAL HIGH (ref 0.00–0.48)

## 2012-06-23 MED ORDER — ASPIRIN 81 MG PO CHEW
CHEWABLE_TABLET | ORAL | Status: AC
  Filled 2012-06-23: qty 1

## 2012-06-23 MED ORDER — IOHEXOL 350 MG/ML SOLN
100.0000 mL | Freq: Once | INTRAVENOUS | Status: AC | PRN
  Administered 2012-06-23: 100 mL via INTRAVENOUS

## 2012-06-23 MED ORDER — ASPIRIN 81 MG PO CHEW
324.0000 mg | CHEWABLE_TABLET | Freq: Once | ORAL | Status: AC
  Administered 2012-06-23: 324 mg via ORAL
  Filled 2012-06-23: qty 4
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
## 2012-06-23 NOTE — ED Provider Notes (Signed)
History     CSN: [id number]  Arrival date & time 06/23/12  1411   First MD Initiated Contact with Patient 06/23/12 1508      Chief Complaint  Patient presents with  . Chest Pain  . Weakness    (Consider location/radiation/quality/duration/timing/severity/associated sxs/prior treatment) HPI Pt presents with c/o chest pain.  She states she has been having chest pain over the past several days.  First episode was several days ago- pains are fleeting, and then has recurred most days this week.  Last episode of pain was today.  States sharp midsternal chest pain with radiation to left arm.  Pt states she has a "bad heart" and has a lot of anxiety concerning her diagnosis of MR.  She states she has been under a lot of stress due to problems with her neighbor.  Denies shortness of breath, no vomiting, no fainting.  She had a normal stress test approx 4 years ago and is being monitored by her cardiologist for her MR- currently having medical management.  Chest pain is not exertional.  There are no other associated systemic symptoms, there are no other alleviating or modifying factors.   Past Medical History  Diagnosis Date  . Hypertension   . Moderate mitral regurgitation by prior echocardiogram   . Mitral valve prolapse   . Anxiety   . GERD (gastroesophageal reflux disease)   . Bipolar 1 disorder   . Osteoarthritis   . Tobacco abuse   . Rotator cuff syndrome   . Chest pain     Past Surgical History  Procedure Date  . Posttonsillectomy hemmorhage   . Tonsillectomy     Family History  Problem Relation Age of Onset  . Heart disease Mother   . Heart attack Father     History  Substance Use Topics  . Smoking status: Current Every Day Smoker -- 1.0 packs/day for 30 years    Types: Cigarettes  . Smokeless tobacco: Not on file  . Alcohol Use: 0.6 oz/week    1 Cans of beer per week    OB History    Grav Para Term Preterm Abortions TAB SAB Ect Mult Living                   Review of Systems ROS reviewed and all otherwise negative except for mentioned in HPI  Allergies  Review of patient's allergies indicates no known allergies.  Home Medications   Current Outpatient Rx  Name  Route  Sig  Dispense  Refill  . BUPROPION HCL ER (XL) 300 MG PO TB24   Oral   Take 300 mg by mouth every morning. 1 tab daily         . CLONAZEPAM 1 MG PO TABS   Oral   Take 1 mg by mouth 3 (three) times daily as needed. anxiety         . FIBER PO   Oral   Take 1 capsule by mouth daily.         Marland Kitchen HYDROCODONE-ACETAMINOPHEN 5-500 MG PO TABS   Oral   Take 1 tablet by mouth every 6 (six) hours as needed. For pain         . LAMOTRIGINE 25 MG PO TABS   Oral   Take 50 mg by mouth at bedtime.          Marland Kitchen LISINOPRIL 20 MG PO TABS   Oral   Take 20 mg by mouth every morning.          Marland Kitchen  LOSARTAN POTASSIUM 100 MG PO TABS   Oral   Take 100 mg by mouth every morning.          Marland Kitchen OMEPRAZOLE 20 MG PO CPDR   Oral   Take 20 mg by mouth every morning.         Marland Kitchen PREGABALIN 50 MG PO CAPS   Oral   Take 50 mg by mouth 2 (two) times daily as needed. Nerve pain           BP 154/80  Pulse 71  Temp 97.6 F (36.4 C) (Oral)  Resp 16  SpO2 100% Vitals reviewed Physical Exam Physical Examination: General appearance - alert, well appearing, and in no distress Mental status - alert, oriented to person, place, and time Eyes - no conjunctival injection, no scleral icterus Mouth - mucous membranes moist, pharynx normal without lesions Chest - clear to auscultation, no wheezes, rales or rhonchi, symmetric air entry Heart - normal rate, regular rhythm, normal S1, S2, no murmurs, rubs, clicks or gallops Abdomen - soft, nontender, nondistended, no masses or organomegaly Extremities - peripheral pulses normal, no pedal edema, no clubbing or cyanosis Skin - normal coloration and turgor, no rashes Psych- anxious, intermittently tearful  ED Course  Procedures  (including critical care time)   Date: 06/23/2012  Rate: 74  Rhythm: normal sinus rhythm  QRS Axis: normal  Intervals: normal  ST/T Wave abnormalities: early repolarization  Conduction Disutrbances:none  Narrative Interpretation: baseline wander, LVH  Old EKG Reviewed: unchanged   Labs Reviewed  BASIC METABOLIC PANEL - Abnormal; Notable for the following:    Glucose, Bld 104 (*)     All other components within normal limits  D-DIMER, QUANTITATIVE - Abnormal; Notable for the following:    D-Dimer, Quant 1.45 (*)     All other components within normal limits  CBC  TROPONIN I  TROPONIN I   Dg Chest 2 View  06/23/2012  *RADIOLOGY REPORT*  Clinical Data: Chest pain and weakness.  Cardiac history.  History of hypertension.  CHEST - 2 VIEW  Comparison: 05/18/2009  Findings: The patient has had prior cervical fusion. Cardiomediastinal silhouette is within normal limits.  The lungs are free of focal consolidations and pleural effusions.  No evidence for pulmonary edema. Visualized osseous structures have a normal appearance.  IMPRESSION: No evidence for acute cardiopulmonary abnormality.   Original Report Authenticated By: Norva Pavlov, M.D.    Ct Angio Chest Pe W/cm &/or Wo Cm  06/23/2012  *RADIOLOGY REPORT*  Clinical Data: Chest pain.  Elevated D-dimer.  CT ANGIOGRAPHY CHEST  Technique:  Multidetector CT imaging of the chest using the standard protocol during bolus administration of intravenous contrast. Multiplanar reconstructed images including MIPs were obtained and reviewed to evaluate the vascular anatomy.  Contrast: OMNIPAQUE IOHEXOL 350 MG/ML SOLN  Comparison: Chest x-ray 06/23/2012  Findings: No filling defects in the pulmonary arteries to suggest pulmonary emboli.  Heart is borderline in size.  Aorta is normal caliber. No mediastinal, hilar, or axillary adenopathy.  Visualized thyroid and chest wall soft tissues unremarkable. Lungs are clear. No focal airspace opacities or  suspicious nodules.  No effusions. Imaging into the upper abdomen shows no acute findings.  IMPRESSION: No evidence of pulmonary embolus.  No acute findings.   Original Report Authenticated By: Charlett Nose, M.D.      1. Chest pain       MDM  Pt presenting with c/o chest pains which have been intermittent during the past week.  She  states she has been under increased stress at home.  Pt has hx of Mitral regurg and is concerned that this is causing her to have heart attacks.  She has a reassuring EKG which is unchanged compared to her prior.  The pain is not associated with exertion.  2 sets of troponins negative in the ED.  Other labs and CXR including CT angio of chest are also reassuring.  I have a low suspicion for ACS, dissection, PE, or other acute emergent condition at this time.  Pt also states she would prefer not to be admitted. She was encouraged to arrange for f/u with her cardiologist.   Discharged with strict return precautions.  Pt agreeable with plan.        Ethelda Chick, MD 06/23/12 [phone]

## 2012-06-23 NOTE — ED Notes (Signed)
Patient transported to CT 

## 2012-07-15 DIAGNOSIS — Z5181 Encounter for therapeutic drug level monitoring: Secondary | ICD-10-CM | POA: Diagnosis not present

## 2012-07-15 DIAGNOSIS — Z79899 Other long term (current) drug therapy: Secondary | ICD-10-CM | POA: Diagnosis not present

## 2012-07-15 DIAGNOSIS — I10 Essential (primary) hypertension: Secondary | ICD-10-CM | POA: Diagnosis not present

## 2012-07-15 DIAGNOSIS — G8929 Other chronic pain: Secondary | ICD-10-CM | POA: Diagnosis not present

## 2012-08-06 ENCOUNTER — Ambulatory Visit: Payer: Medicare Other | Admitting: Cardiovascular Disease

## 2012-08-08 ENCOUNTER — Ambulatory Visit: Payer: Medicare Other | Admitting: Cardiovascular Disease

## 2012-08-15 DIAGNOSIS — IMO0002 Reserved for concepts with insufficient information to code with codable children: Secondary | ICD-10-CM | POA: Diagnosis not present

## 2012-09-11 ENCOUNTER — Emergency Department (HOSPITAL_COMMUNITY)
Admission: EM | Admit: 2012-09-11 | Discharge: 2012-09-11 | Disposition: A | Discharge status: Home / Self Care | Payer: Medicare Other | Attending: Emergency Medicine | Admitting: Emergency Medicine

## 2012-09-11 ENCOUNTER — Encounter (HOSPITAL_COMMUNITY): Payer: Self-pay | Admitting: Emergency Medicine

## 2012-09-11 ENCOUNTER — Emergency Department (HOSPITAL_COMMUNITY): Payer: Medicare Other

## 2012-09-11 DIAGNOSIS — J029 Acute pharyngitis, unspecified: Secondary | ICD-10-CM | POA: Diagnosis not present

## 2012-09-11 DIAGNOSIS — R5381 Other malaise: Secondary | ICD-10-CM | POA: Insufficient documentation

## 2012-09-11 DIAGNOSIS — R0609 Other forms of dyspnea: Secondary | ICD-10-CM | POA: Diagnosis not present

## 2012-09-11 DIAGNOSIS — I10 Essential (primary) hypertension: Secondary | ICD-10-CM | POA: Diagnosis not present

## 2012-09-11 DIAGNOSIS — K219 Gastro-esophageal reflux disease without esophagitis: Secondary | ICD-10-CM | POA: Diagnosis not present

## 2012-09-11 DIAGNOSIS — M1990 Unspecified osteoarthritis, unspecified site: Secondary | ICD-10-CM | POA: Insufficient documentation

## 2012-09-11 DIAGNOSIS — F411 Generalized anxiety disorder: Secondary | ICD-10-CM | POA: Insufficient documentation

## 2012-09-11 DIAGNOSIS — F17200 Nicotine dependence, unspecified, uncomplicated: Secondary | ICD-10-CM | POA: Diagnosis not present

## 2012-09-11 DIAGNOSIS — Z79899 Other long term (current) drug therapy: Secondary | ICD-10-CM | POA: Diagnosis not present

## 2012-09-11 DIAGNOSIS — F319 Bipolar disorder, unspecified: Secondary | ICD-10-CM | POA: Diagnosis not present

## 2012-09-11 DIAGNOSIS — Z8679 Personal history of other diseases of the circulatory system: Secondary | ICD-10-CM | POA: Diagnosis not present

## 2012-09-11 DIAGNOSIS — Z8739 Personal history of other diseases of the musculoskeletal system and connective tissue: Secondary | ICD-10-CM | POA: Diagnosis not present

## 2012-09-11 DIAGNOSIS — R0989 Other specified symptoms and signs involving the circulatory and respiratory systems: Secondary | ICD-10-CM | POA: Insufficient documentation

## 2012-09-11 LAB — POCT I-STAT, CHEM 8
BUN: 8 mg/dL (ref 6–23)
Calcium, Ion: 1.17 mmol/L (ref 1.12–1.23)
Chloride: 107 mEq/L (ref 96–112)
HCT: 39 % (ref 36.0–46.0)
Potassium: 4.1 mEq/L (ref 3.5–5.1)
Sodium: 140 mEq/L (ref 135–145)

## 2012-09-11 LAB — RAPID STREP SCREEN (MED CTR MEBANE ONLY): Streptococcus, Group A Screen (Direct): NEGATIVE

## 2012-09-11 LAB — POCT I-STAT TROPONIN I: Troponin i, poc: 0.01 ng/mL (ref 0.00–0.08)

## 2012-09-11 NOTE — Progress Notes (Signed)
Pt confirms pcp is [facility] EPIC updated  

## 2012-09-11 NOTE — ED Notes (Addendum)
Went to room to discharge patient. Patient not in room. Gown at bedside with cardiac leads on mattress cover. Attempted to locate patient in department and checked restrooms. Patient not found. Attempted to contact patient to inform her of discharge paperwork and/or instructions. Number on file was reported as a wrong number. Discharge instructions did not include any medications, will keep discharge instructions in discharge bin for possible pickup.

## 2012-09-11 NOTE — ED Notes (Signed)
Bed:WA01<BR> Expected date:<BR> Expected time:<BR> Means of arrival:<BR> Comments:<BR>

## 2012-09-11 NOTE — ED Provider Notes (Signed)
History     CSN: [id number]  Arrival date & time 09/11/12  1058   First MD Initiated Contact with Patient 09/11/12 1115      Chief Complaint  Patient presents with  . Shortness of Breath  . Sore Throat    (Consider location/radiation/quality/duration/timing/severity/associated sxs/prior treatment) Patient is a 57 y.o. female presenting with shortness of breath and pharyngitis. The history is provided by the patient.  Shortness of Breath Associated symptoms: cough and sore throat   Associated symptoms: no chest pain, no fever, no headaches, no neck pain, no rash and no vomiting   Sore Throat Associated symptoms include shortness of breath. Pertinent negatives include no chest pain and no headaches.  pt indicates in past couple days sore throat. Was constant, is mildly improved today. No trouble breathing or swallowing. States family member recently treated for strep throat, so wanted to make sure she didn't have it.  Also notes occasional non prod cough, w cough, mild sob. No chest pain or discomfort. No unusual doe. No hx cad or fam hx premature cad. Denies leg pain or swelling. No immobility, trauma, surgery. No hx dvt or pe. Denies hx asthma or copd. +smoker. Denies fever or chills.     Past Medical History  Diagnosis Date  . Hypertension   . Moderate mitral regurgitation by prior echocardiogram   . Mitral valve prolapse   . Anxiety   . GERD (gastroesophageal reflux disease)   . Bipolar 1 disorder   . Osteoarthritis   . Tobacco abuse   . Rotator cuff syndrome   . Chest pain     Past Surgical History  Procedure Laterality Date  . Posttonsillectomy hemmorhage    . Tonsillectomy      Family History  Problem Relation Age of Onset  . Heart disease Mother   . Heart attack Father     History  Substance Use Topics  . Smoking status: Current Every Day Smoker -- 1.00 packs/day for 30 years    Types: Cigarettes  . Smokeless tobacco: Not on file  . Alcohol Use: 0.6  oz/week    1 Cans of beer per week    OB History   Grav Para Term Preterm Abortions TAB SAB Ect Mult Living                  Review of Systems  Constitutional: Negative for fever and chills.  HENT: Positive for sore throat. Negative for trouble swallowing, neck pain and neck stiffness.   Eyes: Negative for redness.  Respiratory: Positive for cough and shortness of breath.   Cardiovascular: Negative for chest pain and leg swelling.  Gastrointestinal: Negative for nausea and vomiting.  Skin: Negative for rash.  Neurological: Negative for headaches.    Allergies  Review of patient's allergies indicates no known allergies.  Home Medications   Current Outpatient Rx  Name  Route  Sig  Dispense  Refill  . buPROPion (WELLBUTRIN XL) 300 MG 24 hr tablet   Oral   Take 300 mg by mouth every morning. 1 tab daily         . clonazePAM (KLONOPIN) 1 MG tablet   Oral   Take 1 mg by mouth 3 (three) times daily as needed. anxiety         . FIBER PO   Oral   Take 1 capsule by mouth daily.         Marland Kitchen HYDROcodone-acetaminophen (VICODIN) 5-500 MG per tablet   Oral   Take 1  tablet by mouth every 6 (six) hours as needed. For pain         . lamoTRIgine (LAMICTAL) 25 MG tablet   Oral   Take 50 mg by mouth at bedtime.          Marland Kitchen lisinopril (PRINIVIL,ZESTRIL) 20 MG tablet   Oral   Take 20 mg by mouth every morning.          Marland Kitchen losartan (COZAAR) 100 MG tablet   Oral   Take 100 mg by mouth every morning.          Marland Kitchen omeprazole (PRILOSEC) 20 MG capsule   Oral   Take 20 mg by mouth every morning.         . pregabalin (LYRICA) 50 MG capsule   Oral   Take 50 mg by mouth 2 (two) times daily as needed. Nerve pain           BP 141/74  Pulse 72  Temp(Src) 98.5 F (36.9 C) (Oral)  Resp 20  SpO2 100%  Physical Exam  Nursing note and vitals reviewed. Constitutional: She appears well-developed and well-nourished. No distress.  HENT:  Nose: Nose normal.  Pharynx w mild  erythema. No exudate. No asymmetric swelling or abscess.   Eyes: Conjunctivae are normal. No scleral icterus.  Neck: Neck supple. No tracheal deviation present.  Cardiovascular: Normal rate, regular rhythm, normal heart sounds and intact distal pulses.   Pulmonary/Chest: Effort normal and breath sounds normal. No respiratory distress. She exhibits no tenderness.  Abdominal: Soft. Normal appearance. She exhibits no distension. There is no tenderness.  Musculoskeletal: She exhibits no edema and no tenderness.  Lymphadenopathy:    She has no cervical adenopathy.  Neurological: She is alert.  Skin: Skin is warm and dry. No rash noted.  Psychiatric: She has a normal mood and affect.    ED Course  Procedures (including critical care time)   Results for orders placed during the hospital encounter of 09/11/12  RAPID STREP SCREEN      Result Value Range   Streptococcus, Group A Screen (Direct) NEGATIVE  NEGATIVE  POCT I-STAT, CHEM 8      Result Value Range   Sodium 140  135 - 145 mEq/L   Potassium 4.1  3.5 - 5.1 mEq/L   Chloride 107  96 - 112 mEq/L   BUN 8  6 - 23 mg/dL   Creatinine, Ser 4.78  0.50 - 1.10 mg/dL   Glucose, Bld 82  70 - 99 mg/dL   Calcium, Ion 2.95  6.21 - 1.23 mmol/L   TCO2 26  0 - 100 mmol/L   Hemoglobin 13.3  12.0 - 15.0 g/dL   HCT 30.8  65.7 - 84.6 %  POCT I-STAT TROPONIN I      Result Value Range   Troponin i, poc 0.01  0.00 - 0.08 ng/mL   Comment 3            Dg Chest 2 View  09/11/2012  *RADIOLOGY REPORT*  Clinical Data: Shortness of breath and chest pain.  CHEST - 2 VIEW  Comparison: Chest x-ray 06/23/2012.  Findings: Prominent nipple shadows. Lung volumes are normal.  No consolidative airspace disease.  No pleural effusions.  No pneumothorax.  No pulmonary nodule or mass noted.  Pulmonary vasculature and the cardiomediastinal silhouette are within normal limits.  Orthopedic fixation hardware in the lower cervical spine.  IMPRESSION: 1. No radiographic evidence  of acute cardiopulmonary disease.   Original Report Authenticated By: Reuel Boom  Entrikin, M.D.       MDM  Pt states concerned possible strep, and has hx mvp so came to get checked.   Reviewed nursing notes and prior charts for additional history.     Date: 09/11/2012  Rate: 66  Rhythm: normal sinus rhythm  QRS Axis: normal  Intervals: normal  ST/T Wave abnormalities: normal  Conduction Disutrbances:lvh  Narrative Interpretation:   Old EKG Reviewed: unchanged  Strep neg, discussed w pt.   Pt appears stable for d/c.        Suzi Roots, MD 09/16/12 [phone]

## 2012-09-11 NOTE — ED Notes (Signed)
States that has a "leaking" heart valve. States that she has been experiencing shortness of breath and a sore throat..; states that her daughter has been treated for strept throat.

## 2012-09-13 DIAGNOSIS — I10 Essential (primary) hypertension: Secondary | ICD-10-CM | POA: Diagnosis not present

## 2012-09-13 DIAGNOSIS — Z5181 Encounter for therapeutic drug level monitoring: Secondary | ICD-10-CM | POA: Diagnosis not present

## 2012-09-13 DIAGNOSIS — G8929 Other chronic pain: Secondary | ICD-10-CM | POA: Diagnosis not present

## 2012-09-13 DIAGNOSIS — Z79899 Other long term (current) drug therapy: Secondary | ICD-10-CM | POA: Diagnosis not present

## 2012-09-23 ENCOUNTER — Ambulatory Visit: Payer: Medicare Other | Admitting: Cardiovascular Disease

## 2012-10-01 DIAGNOSIS — M542 Cervicalgia: Secondary | ICD-10-CM | POA: Diagnosis not present

## 2012-10-01 DIAGNOSIS — E538 Deficiency of other specified B group vitamins: Secondary | ICD-10-CM | POA: Diagnosis not present

## 2012-10-01 DIAGNOSIS — F411 Generalized anxiety disorder: Secondary | ICD-10-CM | POA: Diagnosis not present

## 2012-10-01 DIAGNOSIS — I10 Essential (primary) hypertension: Secondary | ICD-10-CM | POA: Diagnosis not present

## 2012-10-01 DIAGNOSIS — F329 Major depressive disorder, single episode, unspecified: Secondary | ICD-10-CM | POA: Diagnosis not present

## 2012-10-16 DIAGNOSIS — I059 Rheumatic mitral valve disease, unspecified: Secondary | ICD-10-CM | POA: Diagnosis not present

## 2012-10-16 DIAGNOSIS — R079 Chest pain, unspecified: Secondary | ICD-10-CM | POA: Diagnosis not present

## 2012-10-16 DIAGNOSIS — K649 Unspecified hemorrhoids: Secondary | ICD-10-CM | POA: Diagnosis not present

## 2012-10-16 DIAGNOSIS — M549 Dorsalgia, unspecified: Secondary | ICD-10-CM | POA: Diagnosis not present

## 2012-10-16 DIAGNOSIS — M542 Cervicalgia: Secondary | ICD-10-CM | POA: Diagnosis not present

## 2012-10-16 DIAGNOSIS — K219 Gastro-esophageal reflux disease without esophagitis: Secondary | ICD-10-CM | POA: Diagnosis not present

## 2012-10-21 DIAGNOSIS — M545 Low back pain: Secondary | ICD-10-CM | POA: Diagnosis not present

## 2012-10-21 DIAGNOSIS — M542 Cervicalgia: Secondary | ICD-10-CM | POA: Diagnosis not present

## 2012-10-23 ENCOUNTER — Ambulatory Visit (INDEPENDENT_AMBULATORY_CARE_PROVIDER_SITE_OTHER): Payer: Medicare Other | Admitting: Physician Assistant

## 2012-10-23 ENCOUNTER — Encounter: Payer: Self-pay | Admitting: Physician Assistant

## 2012-10-23 VITALS — BP 126/80 | HR 66 | Ht 62.0 in | Wt 124.4 lb

## 2012-10-23 DIAGNOSIS — F17200 Nicotine dependence, unspecified, uncomplicated: Secondary | ICD-10-CM

## 2012-10-23 DIAGNOSIS — I10 Essential (primary) hypertension: Secondary | ICD-10-CM

## 2012-10-23 DIAGNOSIS — I080 Rheumatic disorders of both mitral and aortic valves: Secondary | ICD-10-CM

## 2012-10-23 DIAGNOSIS — R0602 Shortness of breath: Secondary | ICD-10-CM | POA: Diagnosis not present

## 2012-10-23 DIAGNOSIS — I340 Nonrheumatic mitral (valve) insufficiency: Secondary | ICD-10-CM

## 2012-10-23 DIAGNOSIS — I059 Rheumatic mitral valve disease, unspecified: Secondary | ICD-10-CM

## 2012-10-23 NOTE — Progress Notes (Signed)
HPI:   This is a 57 year old African American female patient who has history of mitral valve prolapse and moderate MR and is followed by Dr. Excell Seltzer. She has not been seen in years because she was afraid to come back. She is under a tremendous amount of stress and is also having significant neck pain from a recent fall. She changed her primary physician to [facility] who urged her to make an appointment here.  The patient complains of progressively worsening dyspnea on exertion. She says she can do very little before she gets out of breath. If she rests her breathing normalizes. Cleaning the bathroom to doing any light housework causes her to be short of breath. Very little activity brings it on. She denies any chest pain, palpitations, dizziness, or presyncope. She is trying to quit smoking and has switched electroniccigarettes.  No Known Allergies  Current Outpatient Prescriptions on File Prior to Visit: buPROPion (WELLBUTRIN XL) 300 MG 24 hr tablet, Take 300 mg by mouth every morning. , Disp: , Rfl:  clonazePAM (KLONOPIN) 1 MG tablet, Take 1 mg by mouth 3 (three) times daily as needed for anxiety. , Disp: , Rfl:  FIBER PO, Take 1 capsule by mouth daily., Disp: , Rfl:  gabapentin (NEURONTIN) 300 MG capsule, Take 300 mg by mouth 2 (two) times daily as needed (for nerve pain.)., Disp: , Rfl:  HYDROcodone-acetaminophen (VICODIN) 5-500 MG per tablet, Take 1 tablet by mouth every 6 (six) hours as needed. For pain, Disp: , Rfl:  lamoTRIgine (LAMICTAL) 25 MG tablet, Take 50 mg by mouth at bedtime. , Disp: , Rfl:  lisinopril (PRINIVIL,ZESTRIL) 20 MG tablet, Take 20 mg by mouth every morning. , Disp: , Rfl:  losartan (COZAAR) 100 MG tablet, Take 100 mg by mouth every morning. , Disp: , Rfl:  omeprazole (PRILOSEC) 20 MG capsule, Take 20 mg by mouth every morning., Disp: , Rfl:  OVER THE COUNTER MEDICATION, Take 1 capsule by mouth 2 (two) times daily. Chloraphyll capsule., Disp: , Rfl:   No current  facility-administered medications on file prior to visit.   Past Medical History:   Hypertension                                                 Moderate mitral regurgitation by prior echocar*              Mitral valve prolapse                                        Anxiety                                                      GERD (gastroesophageal reflux disease)                       Bipolar 1 disorder                                           Osteoarthritis  Tobacco abuse                                                Rotator cuff syndrome                                        Chest pain                                                  Past Surgical History:   posttonsillectomy hemmorhage                                  TONSILLECTOMY                                                Review of patient's family history indicates:   Heart disease                  Mother                   Heart attack                   Father                   Social History   Marital Status: Divorced            Spouse Name:                      Years of Education:                 Number of children:             Occupational History   None on file  Social History Main Topics   Smoking Status: Current Every Day Smoker        Packs/Day: 1.00  Years: 30        Types: Cigarettes   Smokeless Status: Not on file                      Comment: uses e-cig   Alcohol Use: Yes           0.6 oz/week      1 Cans of beer per week   Drug Use: No             Sexual Activity: Not on file        Other Topics            Concern   None on file  Social History Narrative   None on file    ROS: See history of present illness otherwise negative  PHYSICAL EXAM: Well-nournished, in no acute distress. Neck: No JVD, HJR, Bruit, or thyroid enlargement  Lungs: No tachypnea, clear without wheezing, rales, or rhonchi  Cardiovascular: RRR, PMI not displaced, mid  systolic click at the apex with 3-4/6 systolic  murmur radiating to her axilla, no gallops, bruit, thrill, or heave.  Abdomen: BS normal. Soft without organomegaly, masses, lesions or tenderness.  Extremities: without cyanosis, clubbing or edema. Good distal pulses bilateral  SKin: Warm, no lesions or rashes   Musculoskeletal: No deformities  Neuro: no focal signs  BP 126/80  Pulse 66  Ht 5\' 2"  (1.575 m)  Wt 124 lb 6.4 oz (56.427 kg)  BMI 22.75 kg/m2   EKG from 09/11/12 showed normal sinus rhythm with LVH

## 2012-10-23 NOTE — Patient Instructions (Addendum)
Your physician recommends that you schedule a follow-up appointment in: 1 month with Dr Excell Seltzer  Your physician has requested that you have an echocardiogram. Echocardiography is a painless test that uses sound waves to create images of your heart. It provides your doctor with information about the size and shape of your heart and how well your heart's chambers and valves are working. This procedure takes approximately one hour. There are no restrictions for this procedure.

## 2012-10-23 NOTE — Assessment & Plan Note (Signed)
Stable

## 2012-10-23 NOTE — Assessment & Plan Note (Signed)
Patient is now on electronic cigarettes and trying to quit.

## 2012-10-23 NOTE — Assessment & Plan Note (Signed)
Patient has history of moderate mitral regurgitation with mitral valve prolapse. She has had progressive dyspnea on exertion. We will reevaluate her with a 2-D echo. She also may need evaluated for ischemia with a Myoview in the future. We'll hold off on it until the 2-D echo is done and Dr. Excell Seltzer evaluates.

## 2012-10-25 DIAGNOSIS — M542 Cervicalgia: Secondary | ICD-10-CM | POA: Diagnosis not present

## 2012-10-31 ENCOUNTER — Ambulatory Visit (HOSPITAL_COMMUNITY): Payer: Medicare Other | Attending: Cardiovascular Disease | Admitting: Radiology

## 2012-10-31 DIAGNOSIS — M542 Cervicalgia: Secondary | ICD-10-CM | POA: Diagnosis not present

## 2012-10-31 DIAGNOSIS — I10 Essential (primary) hypertension: Secondary | ICD-10-CM | POA: Insufficient documentation

## 2012-10-31 DIAGNOSIS — I080 Rheumatic disorders of both mitral and aortic valves: Secondary | ICD-10-CM | POA: Diagnosis not present

## 2012-10-31 DIAGNOSIS — I059 Rheumatic mitral valve disease, unspecified: Secondary | ICD-10-CM

## 2012-10-31 DIAGNOSIS — F17200 Nicotine dependence, unspecified, uncomplicated: Secondary | ICD-10-CM | POA: Insufficient documentation

## 2012-10-31 DIAGNOSIS — Z8249 Family history of ischemic heart disease and other diseases of the circulatory system: Secondary | ICD-10-CM | POA: Insufficient documentation

## 2012-10-31 DIAGNOSIS — R0602 Shortness of breath: Secondary | ICD-10-CM | POA: Diagnosis not present

## 2012-10-31 DIAGNOSIS — I340 Nonrheumatic mitral (valve) insufficiency: Secondary | ICD-10-CM

## 2012-10-31 NOTE — Progress Notes (Signed)
Echocardiogram performed.  

## 2012-11-01 NOTE — Progress Notes (Signed)
Copy of Echo Report Faxed to Dr. Lerry Liner

## 2012-11-05 DIAGNOSIS — M542 Cervicalgia: Secondary | ICD-10-CM | POA: Diagnosis not present

## 2012-11-07 ENCOUNTER — Other Ambulatory Visit: Payer: Self-pay | Admitting: *Deleted

## 2012-11-07 DIAGNOSIS — M542 Cervicalgia: Secondary | ICD-10-CM | POA: Diagnosis not present

## 2012-11-07 DIAGNOSIS — R0600 Dyspnea, unspecified: Secondary | ICD-10-CM

## 2012-11-12 DIAGNOSIS — M542 Cervicalgia: Secondary | ICD-10-CM | POA: Diagnosis not present

## 2012-11-14 DIAGNOSIS — M542 Cervicalgia: Secondary | ICD-10-CM | POA: Diagnosis not present

## 2012-11-20 ENCOUNTER — Ambulatory Visit (HOSPITAL_COMMUNITY): Payer: Medicare Other | Attending: Physician Assistant | Admitting: Radiology

## 2012-11-20 VITALS — BP 147/82 | Ht 62.0 in | Wt 119.0 lb

## 2012-11-20 DIAGNOSIS — I10 Essential (primary) hypertension: Secondary | ICD-10-CM | POA: Insufficient documentation

## 2012-11-20 DIAGNOSIS — R000 Tachycardia, unspecified: Secondary | ICD-10-CM | POA: Diagnosis not present

## 2012-11-20 DIAGNOSIS — R42 Dizziness and giddiness: Secondary | ICD-10-CM | POA: Insufficient documentation

## 2012-11-20 DIAGNOSIS — R002 Palpitations: Secondary | ICD-10-CM | POA: Diagnosis not present

## 2012-11-20 DIAGNOSIS — Z8249 Family history of ischemic heart disease and other diseases of the circulatory system: Secondary | ICD-10-CM | POA: Diagnosis not present

## 2012-11-20 DIAGNOSIS — F17200 Nicotine dependence, unspecified, uncomplicated: Secondary | ICD-10-CM | POA: Insufficient documentation

## 2012-11-20 DIAGNOSIS — R0600 Dyspnea, unspecified: Secondary | ICD-10-CM

## 2012-11-20 DIAGNOSIS — R0609 Other forms of dyspnea: Secondary | ICD-10-CM | POA: Diagnosis not present

## 2012-11-20 DIAGNOSIS — R5381 Other malaise: Secondary | ICD-10-CM | POA: Diagnosis not present

## 2012-11-20 DIAGNOSIS — R0602 Shortness of breath: Secondary | ICD-10-CM | POA: Diagnosis not present

## 2012-11-20 DIAGNOSIS — R0989 Other specified symptoms and signs involving the circulatory and respiratory systems: Secondary | ICD-10-CM | POA: Insufficient documentation

## 2012-11-20 MED ORDER — REGADENOSON 0.4 MG/5ML IV SOLN
0.4000 mg | Freq: Once | INTRAVENOUS | Status: AC
  Administered 2012-11-20: 0.4 mg via INTRAVENOUS

## 2012-11-20 MED ORDER — TECHNETIUM TC 99M SESTAMIBI GENERIC - CARDIOLITE
30.0000 | Freq: Once | INTRAVENOUS | Status: AC | PRN
  Administered 2012-11-20: 30 via INTRAVENOUS

## 2012-11-20 MED ORDER — TECHNETIUM TC 99M SESTAMIBI GENERIC - CARDIOLITE
10.0000 | Freq: Once | INTRAVENOUS | Status: AC | PRN
  Administered 2012-11-20: 10 via INTRAVENOUS

## 2012-11-20 NOTE — Progress Notes (Signed)
[facility] SITE 3 NUCLEAR MED [address] [place], [place] [address] [phone]    Cardiology Nuclear Med Study  Nichole Gibbs is a 57 y.o. female     MRN : [id number]     DOB: 1956/05/31  Procedure Date: 11/20/2012  Nuclear Med Background Indication for Stress Test:  Evaluation for Ischemia History:  5/14 Echo EF 55-60% Cardiac Risk Factors: Family History - CAD, Hypertension and Smoker  Symptoms:  DOE, Fatigue, Light-Headedness, Palpitations and Rapid HR   Nuclear Pre-Procedure Caffeine/Decaff Intake:  None > 12 hrs NPO After: 7:30pm   Lungs:  clear O2 Sat: 99% on room air. IV 0.9% NS with Angio Cath:  22g  IV Site: R Antecubital x 1, tolerated well IV Started by:  Irean Hong, RN  Chest Size (in):  36 Cup Size: B  Height: 5\' 2"  (1.575 m)  Weight:  119 lb (53.978 kg)  BMI:  Body mass index is 21.76 kg/(m^2). Tech Comments:  No medications today    Nuclear Med Study 1 or 2 day study: 1 day  Stress Test Type:  Lexiscan  Reading MD: Kristeen Miss, MD  Order Authorizing Provider:  Tonny Bollman, MD  Resting Radionuclide: Technetium 37m Sestamibi  Resting Radionuclide Dose: 11.0 mCi   Stress Radionuclide:  Technetium 68m Sestamibi  Stress Radionuclide Dose: 33.0 mCi           Stress Protocol Rest HR: 67 Stress HR: 102  Rest BP: 147/82 Stress BP: 136/73  Exercise Time (min): n/a METS: n/a   Predicted Max HR: 165 bpm % Max HR: 61.82 bpm Rate Pressure Product: 21308   Dose of Adenosine (mg):  n/a Dose of Lexiscan: 0.4 mg  Dose of Atropine (mg): n/a Dose of Dobutamine: n/a mcg/kg/min (at max HR)  Stress Test Technologist: Bonnita Levan, RN  Nuclear Technologist:  Domenic Polite, CNMT     Rest Procedure:  Myocardial perfusion imaging was performed at rest 45 minutes following the intravenous administration of Technetium 22m Sestamibi. Rest ECG: NSR - Normal EKG  Stress Procedure:  The patient received IV Lexiscan 0.4 mg over 15-seconds.   Technetium 59m Sestamibi injected at 30-seconds.  Quantitative spect images were obtained after a 45 minute delay. Stress ECG: No significant change from baseline ECG  QPS Raw Data Images:  Normal; no motion artifact; normal heart/lung ratio. Stress Images:  Normal homogeneous uptake in all areas of the myocardium. Rest Images:  Normal homogeneous uptake in all areas of the myocardium. Subtraction (SDS):  No evidence of ischemia. Transient Ischemic Dilatation (Normal <1.22):  1.22 Lung/Heart Ratio (Normal <0.45):  0.34  Quantitative Gated Spect Images QGS EDV:  85 ml QGS ESV:  32 ml  Impression Exercise Capacity:  Lexiscan with no exercise. BP Response:  Normal blood pressure response. Clinical Symptoms:  No significant symptoms noted. ECG Impression:  No significant ST segment change suggestive of ischemia. Comparison with Prior Nuclear Study: No images to compare  Overall Impression:  Normal stress nuclear study.  No evidence of ischemia.    LV Ejection Fraction: 62%.  LV Wall Motion:  NL LV Function; NL Wall Motion.   Vesta Mixer, Montez Hageman., MD, [facility] 11/20/2012, 4:48 PM Office - [phone] Pager [phone]

## 2012-11-27 ENCOUNTER — Ambulatory Visit: Payer: Medicare Other | Admitting: Cardiovascular Disease

## 2012-12-04 DIAGNOSIS — M542 Cervicalgia: Secondary | ICD-10-CM | POA: Diagnosis not present

## 2012-12-09 DIAGNOSIS — M542 Cervicalgia: Secondary | ICD-10-CM | POA: Diagnosis not present

## 2012-12-09 DIAGNOSIS — M545 Low back pain: Secondary | ICD-10-CM | POA: Diagnosis not present

## 2012-12-09 DIAGNOSIS — M47812 Spondylosis without myelopathy or radiculopathy, cervical region: Secondary | ICD-10-CM | POA: Diagnosis not present

## 2012-12-11 DIAGNOSIS — M545 Low back pain: Secondary | ICD-10-CM | POA: Diagnosis not present

## 2012-12-11 DIAGNOSIS — M542 Cervicalgia: Secondary | ICD-10-CM | POA: Diagnosis not present

## 2012-12-17 ENCOUNTER — Encounter: Payer: Self-pay | Admitting: Nurse Practitioner

## 2012-12-17 ENCOUNTER — Ambulatory Visit (INDEPENDENT_AMBULATORY_CARE_PROVIDER_SITE_OTHER): Payer: Medicare Other | Admitting: Nurse Practitioner

## 2012-12-17 VITALS — BP 150/78 | HR 65 | Ht 62.5 in | Wt 121.8 lb

## 2012-12-17 DIAGNOSIS — I080 Rheumatic disorders of both mitral and aortic valves: Secondary | ICD-10-CM | POA: Diagnosis not present

## 2012-12-17 NOTE — Patient Instructions (Addendum)
See Dr. Excell Seltzer in August  If you decide you want to talk to one of the surgeons, let us know  Try to stop smoking  Go to your PCP or Urgent Care for a tetanus shot.  Call the [facility] office at [phone] if you have any questions, problems or concerns.

## 2012-12-17 NOTE — Progress Notes (Addendum)
Nichole Gibbs Date of Birth: 08/22/1955 Medical Record #[id number]  History of Present Illness: Nichole Gibbs is seen back today for work in visit to discuss surgery.  Seen for Dr. Excell Seltzer. She has MVP with moderate MR. She has ongoing tobacco abuse, HTN and chronic shortness of breath.   Last seen here in April for dyspnea. Had her echo updated. This led to a Myoview which was normal.   Comes in today. She is here alone. Having to use a cane to help her walk. Cut her left ankle with a "rusty saw". Does not know her last tetanus date. Still with some bleeding noted. From a cardiac standpoint, she is doing ok. Still short of breath. Still smoking. Has smoked for 30 years. Says she is here to talk about having her valve surgery. Her symptoms do not seem to have changed. No signs of heart failure noted. No indication in her EPIC chart that it is time to proceed with surgery.   Current Outpatient Prescriptions  Medication Sig Dispense Refill  . amLODipine (NORVASC) 5 MG tablet Take 1 tablet by mouth daily.      Marland Kitchen buPROPion (WELLBUTRIN XL) 300 MG 24 hr tablet Take 300 mg by mouth every morning.       . clonazePAM (KLONOPIN) 1 MG tablet Take 1 mg by mouth 3 (three) times daily as needed for anxiety.       . cyclobenzaprine (FLEXERIL) 10 MG tablet as needed.      Marland Kitchen FIBER PO Take 1 capsule by mouth daily.      Marland Kitchen gabapentin (NEURONTIN) 300 MG capsule Take 300 mg by mouth 2 (two) times daily as needed (for nerve pain.).      Marland Kitchen HYDROcodone-acetaminophen (VICODIN) 5-500 MG per tablet Take 1 tablet by mouth every 6 (six) hours as needed. For pain      . lamoTRIgine (LAMICTAL) 25 MG tablet Take 50 mg by mouth at bedtime.       Marland Kitchen lisinopril (PRINIVIL,ZESTRIL) 20 MG tablet Take 20 mg by mouth every morning.       Marland Kitchen losartan (COZAAR) 100 MG tablet Take 100 mg by mouth every morning.       . meloxicam (MOBIC) 15 MG tablet Take 1 tablet by mouth daily.      Marland Kitchen omeprazole (PRILOSEC) 20 MG capsule Take 20  mg by mouth every morning.      Marland Kitchen OVER THE COUNTER MEDICATION Take 1 capsule by mouth 2 (two) times daily. Chloraphyll capsule.       No current facility-administered medications for this visit.    No Known Allergies  Past Medical History  Diagnosis Date  . Hypertension   . Moderate mitral regurgitation by prior echocardiogram   . Mitral valve prolapse   . Anxiety   . GERD (gastroesophageal reflux disease)   . Bipolar 1 disorder   . Osteoarthritis   . Tobacco abuse   . Rotator cuff syndrome   . Chest pain     Past Surgical History  Procedure Laterality Date  . Posttonsillectomy hemmorhage    . Tonsillectomy      History  Smoking status  . Current Every Day Smoker -- 1.00 packs/day for 30 years  . Types: Cigarettes  Smokeless tobacco  . Not on file    Comment: uses e-cig    History  Alcohol Use  . 0.6 oz/week  . 1 Cans of beer per week    Family History  Problem Relation Age of Onset  .  Heart disease Mother   . Heart attack Father     Review of Systems: The review of systems is per the HPI.  All other systems were reviewed and are negative.  Physical Exam: BP 150/78  Pulse 65  Ht 5' 2.5" (1.588 m)  Wt 121 lb 12.8 oz (55.248 kg)  BMI 21.91 kg/m2 Patient is very pleasant and in no acute distress. Skin is warm and dry. Color is normal.  HEENT is unremarkable. Normocephalic/atraumatic. PERRL. Sclera are nonicteric. Neck is supple. No masses. No JVD. Lungs are clear. Cardiac exam shows a regular rate and rhythm. Systolic murmur noted. Abdomen is soft. Extremities are without edema. Left ankle dressing removed. Very minor abrasion on the leg with mild swelling. Gait and ROM are intact. No gross neurologic deficits noted.  LABORATORY DATA:  EKG today shows sinus rhythm with LVH.   Lab Results  Component Value Date   WBC 4.8 06/23/2012   HGB 13.3 09/11/2012   HCT 39.0 09/11/2012   PLT 223 06/23/2012   GLUCOSE 82 09/11/2012   NA 140 09/11/2012   K 4.1 09/11/2012    CL 107 09/11/2012   CREATININE 0.70 09/11/2012   BUN 8 09/11/2012   CO2 24 06/23/2012   INR 1.04 05/19/2009   Echo Study Conclusions from May 2014  - Left ventricle: The cavity size was normal. Wall thickness was normal. Systolic function was normal. The estimated ejection fraction was in the range of 55% to 60%. - Mitral valve: Moderate regurgitation. - Left atrium: The atrium was mildly dilated.  Myoview Impression from May 2014   Exercise Capacity: Lexiscan with no exercise.  BP Response: Normal blood pressure response.  Clinical Symptoms: No significant symptoms noted.  ECG Impression: No significant ST segment change suggestive of ischemia.  Comparison with Prior Nuclear Study: No images to compare   Overall Impression: Normal stress nuclear study. No evidence of ischemia.  LV Ejection Fraction: 62%. LV Wall Motion: NL LV Function; NL Wall Motion.  Nichole Gibbs, Nichole Gibbs., MD, [facility]  11/20/2012, 4:48 PM   Assessment / Plan: 1. Chronic dyspnea - has an over 30 year history of tobacco abuse - still smoking - this may be playing a part - would encourage cessation.   2. MVP with MR - last echo in April of this year - unchanged with moderate MR and normal LV function - will need to be followed - I have spoken to Dr. Excell Seltzer by phone - he does not feel her situation has changed but we could offer her an outpatient surgical evaluation - she has DECLINED.   3. Ankle wound - traumatic - referred to PCP or Urgent Care for a tetanus.   We will see her back in August as planned.   Patient is agreeable to this plan and will call if any problems develop in the interim.   Rosalio Macadamia, RN, ANP-C [facility] [address] [place], [place]  [address]

## 2012-12-19 DIAGNOSIS — M545 Low back pain: Secondary | ICD-10-CM | POA: Diagnosis not present

## 2012-12-25 DIAGNOSIS — M545 Low back pain: Secondary | ICD-10-CM | POA: Diagnosis not present

## 2013-01-02 DIAGNOSIS — M545 Low back pain: Secondary | ICD-10-CM | POA: Diagnosis not present

## 2013-01-10 DIAGNOSIS — S336XXA Sprain of sacroiliac joint, initial encounter: Secondary | ICD-10-CM | POA: Diagnosis not present

## 2013-01-10 DIAGNOSIS — M5137 Other intervertebral disc degeneration, lumbosacral region: Secondary | ICD-10-CM | POA: Diagnosis not present

## 2013-01-10 DIAGNOSIS — IMO0002 Reserved for concepts with insufficient information to code with codable children: Secondary | ICD-10-CM | POA: Diagnosis not present

## 2013-01-10 DIAGNOSIS — M999 Biomechanical lesion, unspecified: Secondary | ICD-10-CM | POA: Diagnosis not present

## 2013-01-15 DIAGNOSIS — S336XXA Sprain of sacroiliac joint, initial encounter: Secondary | ICD-10-CM | POA: Diagnosis not present

## 2013-01-15 DIAGNOSIS — M5137 Other intervertebral disc degeneration, lumbosacral region: Secondary | ICD-10-CM | POA: Diagnosis not present

## 2013-01-15 DIAGNOSIS — M999 Biomechanical lesion, unspecified: Secondary | ICD-10-CM | POA: Diagnosis not present

## 2013-01-15 DIAGNOSIS — IMO0002 Reserved for concepts with insufficient information to code with codable children: Secondary | ICD-10-CM | POA: Diagnosis not present

## 2013-01-17 DIAGNOSIS — M5137 Other intervertebral disc degeneration, lumbosacral region: Secondary | ICD-10-CM | POA: Diagnosis not present

## 2013-01-17 DIAGNOSIS — S336XXA Sprain of sacroiliac joint, initial encounter: Secondary | ICD-10-CM | POA: Diagnosis not present

## 2013-01-17 DIAGNOSIS — IMO0002 Reserved for concepts with insufficient information to code with codable children: Secondary | ICD-10-CM | POA: Diagnosis not present

## 2013-01-17 DIAGNOSIS — M999 Biomechanical lesion, unspecified: Secondary | ICD-10-CM | POA: Diagnosis not present

## 2013-01-20 DIAGNOSIS — M999 Biomechanical lesion, unspecified: Secondary | ICD-10-CM | POA: Diagnosis not present

## 2013-01-20 DIAGNOSIS — M5137 Other intervertebral disc degeneration, lumbosacral region: Secondary | ICD-10-CM | POA: Diagnosis not present

## 2013-01-20 DIAGNOSIS — S336XXA Sprain of sacroiliac joint, initial encounter: Secondary | ICD-10-CM | POA: Diagnosis not present

## 2013-01-20 DIAGNOSIS — IMO0002 Reserved for concepts with insufficient information to code with codable children: Secondary | ICD-10-CM | POA: Diagnosis not present

## 2013-01-22 DIAGNOSIS — M999 Biomechanical lesion, unspecified: Secondary | ICD-10-CM | POA: Diagnosis not present

## 2013-01-22 DIAGNOSIS — S336XXA Sprain of sacroiliac joint, initial encounter: Secondary | ICD-10-CM | POA: Diagnosis not present

## 2013-01-22 DIAGNOSIS — M5137 Other intervertebral disc degeneration, lumbosacral region: Secondary | ICD-10-CM | POA: Diagnosis not present

## 2013-01-22 DIAGNOSIS — IMO0002 Reserved for concepts with insufficient information to code with codable children: Secondary | ICD-10-CM | POA: Diagnosis not present

## 2013-02-21 ENCOUNTER — Ambulatory Visit: Payer: Medicare Other | Admitting: Cardiovascular Disease

## 2013-03-11 DIAGNOSIS — F411 Generalized anxiety disorder: Secondary | ICD-10-CM | POA: Diagnosis not present

## 2013-03-11 DIAGNOSIS — F329 Major depressive disorder, single episode, unspecified: Secondary | ICD-10-CM | POA: Diagnosis not present

## 2013-03-11 DIAGNOSIS — I10 Essential (primary) hypertension: Secondary | ICD-10-CM | POA: Diagnosis not present

## 2013-03-11 DIAGNOSIS — M549 Dorsalgia, unspecified: Secondary | ICD-10-CM | POA: Diagnosis not present

## 2013-03-28 ENCOUNTER — Other Ambulatory Visit: Payer: Self-pay | Admitting: Obstetrics and Gynecology

## 2013-03-28 ENCOUNTER — Other Ambulatory Visit (HOSPITAL_COMMUNITY)
Admission: RE | Admit: 2013-03-28 | Discharge: 2013-03-28 | Disposition: A | Discharge status: Home / Self Care | Payer: Medicare Other | Source: Ambulatory Visit | Attending: Obstetrics and Gynecology | Admitting: Obstetrics and Gynecology

## 2013-03-28 DIAGNOSIS — R87619 Unspecified abnormal cytological findings in specimens from cervix uteri: Secondary | ICD-10-CM | POA: Diagnosis not present

## 2013-03-28 DIAGNOSIS — Z01419 Encounter for gynecological examination (general) (routine) without abnormal findings: Secondary | ICD-10-CM | POA: Diagnosis not present

## 2013-03-28 DIAGNOSIS — Z124 Encounter for screening for malignant neoplasm of cervix: Secondary | ICD-10-CM | POA: Insufficient documentation

## 2013-03-28 DIAGNOSIS — F319 Bipolar disorder, unspecified: Secondary | ICD-10-CM | POA: Diagnosis not present

## 2013-03-28 DIAGNOSIS — F329 Major depressive disorder, single episode, unspecified: Secondary | ICD-10-CM | POA: Diagnosis not present

## 2013-03-28 DIAGNOSIS — Z1151 Encounter for screening for human papillomavirus (HPV): Secondary | ICD-10-CM | POA: Insufficient documentation

## 2013-03-28 DIAGNOSIS — L293 Anogenital pruritus, unspecified: Secondary | ICD-10-CM | POA: Diagnosis not present

## 2013-03-28 DIAGNOSIS — N951 Menopausal and female climacteric states: Secondary | ICD-10-CM | POA: Diagnosis not present

## 2013-04-09 ENCOUNTER — Telehealth: Payer: Self-pay | Admitting: *Deleted

## 2013-04-09 NOTE — Telephone Encounter (Signed)
Patient would like to know if its ok work out at [employer] Patient has joined [employer].

## 2013-04-10 NOTE — Telephone Encounter (Signed)
I will forward this message to Dr Excell Seltzer to review and make recommendations about exercise.

## 2013-04-14 NOTE — Telephone Encounter (Signed)
Ok to work out but should avoid heavy lifting. She should do aerobic exercise and high rep/low weight lifting is ok.

## 2013-04-15 DIAGNOSIS — Z1211 Encounter for screening for malignant neoplasm of colon: Secondary | ICD-10-CM | POA: Diagnosis not present

## 2013-04-15 DIAGNOSIS — K5900 Constipation, unspecified: Secondary | ICD-10-CM | POA: Diagnosis not present

## 2013-04-15 NOTE — Telephone Encounter (Signed)
Attempted to reach pt on cell number but she has a voicemail that has not been set up.

## 2013-04-17 DIAGNOSIS — F319 Bipolar disorder, unspecified: Secondary | ICD-10-CM | POA: Diagnosis not present

## 2013-04-18 DIAGNOSIS — K648 Other hemorrhoids: Secondary | ICD-10-CM | POA: Diagnosis not present

## 2013-04-18 DIAGNOSIS — Z800 Family history of malignant neoplasm of digestive organs: Secondary | ICD-10-CM | POA: Diagnosis not present

## 2013-04-18 DIAGNOSIS — Z1211 Encounter for screening for malignant neoplasm of colon: Secondary | ICD-10-CM | POA: Diagnosis not present

## 2013-04-22 NOTE — Telephone Encounter (Signed)
Pt aware of Dr Earmon Phoenix recommendations about exercise.

## 2013-04-30 ENCOUNTER — Ambulatory Visit (INDEPENDENT_AMBULATORY_CARE_PROVIDER_SITE_OTHER): Payer: Medicare Other | Admitting: Cardiology

## 2013-04-30 ENCOUNTER — Encounter (INDEPENDENT_AMBULATORY_CARE_PROVIDER_SITE_OTHER): Payer: Self-pay

## 2013-04-30 ENCOUNTER — Encounter: Payer: Self-pay | Admitting: Cardiology

## 2013-04-30 VITALS — BP 145/87 | HR 86 | Wt 127.0 lb

## 2013-04-30 DIAGNOSIS — I10 Essential (primary) hypertension: Secondary | ICD-10-CM | POA: Diagnosis not present

## 2013-04-30 DIAGNOSIS — Z8679 Personal history of other diseases of the circulatory system: Secondary | ICD-10-CM

## 2013-04-30 DIAGNOSIS — I080 Rheumatic disorders of both mitral and aortic valves: Secondary | ICD-10-CM

## 2013-04-30 NOTE — Patient Instructions (Signed)
Your physician has requested that you have an echocardiogram. Echocardiography is a painless test that uses sound waves to create images of your heart. It provides your doctor with information about the size and shape of your heart and how well your heart's chambers and valves are working. This procedure takes approximately one hour. There are no restrictions for this procedure. This needs to be done in 6 Months 10/2013  Your physician wants you to follow-up in: 6 Months with Dr. Theodoro Parma will receive a reminder letter in the mail two months in advance. If you don't receive a letter, please call our office to schedule the follow-up appointment.

## 2013-04-30 NOTE — Progress Notes (Signed)
[address] [place], [place]  [address] Phone: [phone] Fax:  [phone]  Date:  04/30/2013   ID:  Nichole Gibbs, Beg 02-09-1956, MRN [id number]  PCP:  Allean Found, MD  Cardiologist:  Armanda Magic, MD     History of Present Illness: Nichole Gibbs is a 57 y.o. female who seen back today for second opinion on surgery for MR.  She is normally seen by Dr. Excell Seltzer. She has MVP with moderate MR. She has ongoing tobacco abuse, HTN and chronic shortness of breath. She was seen here in April for dyspnea. Had her echo updated. This led to a Myoview which was normal. She last saw Norma Fredrickson NP and told her she was ready to have surgery due to SOB.  She was still smoking at that time and had just had another echo done which showed a stable MR with no increase in MR.  She was offered a consult with Dr. Cornelius Moras with CVTS but declined.  She recently voiced to her PCP that she was unhappy that Dr. Excell Seltzer would not proceed with further treatment of MR and now wants a second opinion.  She had an episodes of CP a few weeks ago that the thought was her GERD.  It awakened her from sleep and she has not had any more episodes. She has joined a gym and goes 3 times weekly and uses the elliptical/treadmill and weight machines.  She denies any exertional chest pain or SOB.  She denies any LE edema, palpitations or syncope.    Wt Readings from Last 3 Encounters:  04/30/13 127 lb (57.607 kg)  12/17/12 121 lb 12.8 oz (55.248 kg)  11/20/12 119 lb (53.978 kg)     Past Medical History  Diagnosis Date  . Hypertension   . Moderate mitral regurgitation by prior echocardiogram   . Mitral valve prolapse   . Anxiety   . GERD (gastroesophageal reflux disease)   . Bipolar 1 disorder   . Osteoarthritis   . Tobacco abuse   . Rotator cuff syndrome   . Chest pain     Current Outpatient Prescriptions  Medication Sig Dispense Refill  . amLODipine (NORVASC) 5 MG tablet Take 1 tablet  by mouth daily.      Marland Kitchen buPROPion (WELLBUTRIN XL) 300 MG 24 hr tablet Take 300 mg by mouth every morning.       . clonazePAM (KLONOPIN) 1 MG tablet Take 1 mg by mouth 3 (three) times daily as needed for anxiety.       . cyclobenzaprine (FLEXERIL) 10 MG tablet as needed.      Marland Kitchen FIBER PO Take 1 capsule by mouth daily.      Marland Kitchen gabapentin (NEURONTIN) 300 MG capsule Take 300 mg by mouth 2 (two) times daily as needed (for nerve pain.).      Marland Kitchen HYDROcodone-acetaminophen (VICODIN) 5-500 MG per tablet Take 1 tablet by mouth every 6 (six) hours as needed. For pain      . lamoTRIgine (LAMICTAL) 25 MG tablet Take 50 mg by mouth at bedtime.       Marland Kitchen lisinopril (PRINIVIL,ZESTRIL) 20 MG tablet Take 20 mg by mouth every morning.       Marland Kitchen losartan (COZAAR) 100 MG tablet Take 100 mg by mouth every morning.       . meloxicam (MOBIC) 15 MG tablet Take 1 tablet by mouth daily.      Marland Kitchen omeprazole (PRILOSEC) 20 MG capsule Take 20 mg by mouth  every morning.      Marland Kitchen OVER THE COUNTER MEDICATION Take 1 capsule by mouth 2 (two) times daily. Chloraphyll capsule.       No current facility-administered medications for this visit.    Allergies:   No Known Allergies  Social History:  The patient  reports that she has been smoking Cigarettes.  She has a 30 pack-year smoking history. She does not have any smokeless tobacco history on file. She reports that she drinks about 0.6 ounces of alcohol per week. She reports that she does not use illicit drugs.   Family History:  The patient's family history includes Heart attack in her father; Heart disease in her mother.   ROS:  Please see the history of present illness.      All other systems reviewed and negative.   PHYSICAL EXAM: VS:  BP 145/87  Pulse 86  Wt 127 lb (57.607 kg) Well nourished, well developed, in no acute distress HEENT: normal Neck: no JVD Cardiac:  normal S1, S2; RRR; no murmur Lungs:  clear to auscultation bilaterally, no wheezing, rhonchi or rales Abd: soft,  nontender, no hepatomegaly Ext: no edema Skin: warm and dry Neuro:  CNs 2-12 intact, no focal abnormalities noted       ASSESSMENT AND PLAN:  1. Mitral valve prolapse with moderate MR by echo 10/2012  - repeat 2D echo in May 2015 2. Anxiety 3. HTN - borderline controlled  - continue amlodipine/Losartan  - I have asked her to check her BP daily for a week and call with results  Followup with Dr. Excell Seltzer in 6 months  Signed, Armanda Magic, MD 04/30/2013 9:00 AM

## 2013-05-01 DIAGNOSIS — R7989 Other specified abnormal findings of blood chemistry: Secondary | ICD-10-CM | POA: Diagnosis not present

## 2013-05-05 DIAGNOSIS — F319 Bipolar disorder, unspecified: Secondary | ICD-10-CM | POA: Diagnosis not present

## 2013-05-13 DIAGNOSIS — H40019 Open angle with borderline findings, low risk, unspecified eye: Secondary | ICD-10-CM | POA: Diagnosis not present

## 2013-06-05 DIAGNOSIS — M542 Cervicalgia: Secondary | ICD-10-CM | POA: Diagnosis not present

## 2013-06-05 DIAGNOSIS — F319 Bipolar disorder, unspecified: Secondary | ICD-10-CM | POA: Diagnosis not present

## 2013-06-17 DIAGNOSIS — M542 Cervicalgia: Secondary | ICD-10-CM | POA: Diagnosis not present

## 2013-07-01 DIAGNOSIS — F319 Bipolar disorder, unspecified: Secondary | ICD-10-CM | POA: Diagnosis not present

## 2013-07-04 DIAGNOSIS — M542 Cervicalgia: Secondary | ICD-10-CM | POA: Diagnosis not present

## 2013-07-07 DIAGNOSIS — M542 Cervicalgia: Secondary | ICD-10-CM | POA: Diagnosis not present

## 2013-07-16 DIAGNOSIS — R6889 Other general symptoms and signs: Secondary | ICD-10-CM | POA: Diagnosis not present

## 2013-07-16 DIAGNOSIS — R209 Unspecified disturbances of skin sensation: Secondary | ICD-10-CM | POA: Diagnosis not present

## 2013-07-16 DIAGNOSIS — K5900 Constipation, unspecified: Secondary | ICD-10-CM | POA: Diagnosis not present

## 2013-07-17 DIAGNOSIS — F319 Bipolar disorder, unspecified: Secondary | ICD-10-CM | POA: Diagnosis not present

## 2013-07-29 DIAGNOSIS — K219 Gastro-esophageal reflux disease without esophagitis: Secondary | ICD-10-CM | POA: Diagnosis not present

## 2013-07-29 DIAGNOSIS — R6889 Other general symptoms and signs: Secondary | ICD-10-CM | POA: Diagnosis not present

## 2013-08-08 DIAGNOSIS — R6889 Other general symptoms and signs: Secondary | ICD-10-CM | POA: Diagnosis not present

## 2013-08-13 ENCOUNTER — Other Ambulatory Visit: Payer: Self-pay | Admitting: Otolaryngology

## 2013-08-13 DIAGNOSIS — R196 Halitosis: Secondary | ICD-10-CM

## 2013-08-21 ENCOUNTER — Other Ambulatory Visit: Payer: Medicare Other

## 2013-08-27 ENCOUNTER — Ambulatory Visit
Admission: RE | Admit: 2013-08-27 | Discharge: 2013-08-27 | Disposition: A | Discharge status: Home / Self Care | Payer: Medicare Other | Source: Ambulatory Visit | Attending: Otolaryngology | Admitting: Otolaryngology

## 2013-08-27 DIAGNOSIS — J338 Other polyp of sinus: Secondary | ICD-10-CM | POA: Diagnosis not present

## 2013-08-27 DIAGNOSIS — R196 Halitosis: Secondary | ICD-10-CM

## 2013-08-27 DIAGNOSIS — J984 Other disorders of lung: Secondary | ICD-10-CM | POA: Diagnosis not present

## 2013-08-27 MED ORDER — IOHEXOL 300 MG/ML  SOLN
75.0000 mL | Freq: Once | INTRAMUSCULAR | Status: AC | PRN
  Administered 2013-08-27: 75 mL via INTRAVENOUS

## 2013-08-28 ENCOUNTER — Other Ambulatory Visit: Payer: Medicare Other

## 2013-09-02 ENCOUNTER — Encounter: Payer: Self-pay | Admitting: Gastroenterology

## 2013-09-11 DIAGNOSIS — F411 Generalized anxiety disorder: Secondary | ICD-10-CM | POA: Diagnosis not present

## 2013-09-11 DIAGNOSIS — H40019 Open angle with borderline findings, low risk, unspecified eye: Secondary | ICD-10-CM | POA: Diagnosis not present

## 2013-09-11 DIAGNOSIS — M549 Dorsalgia, unspecified: Secondary | ICD-10-CM | POA: Diagnosis not present

## 2013-09-11 DIAGNOSIS — I10 Essential (primary) hypertension: Secondary | ICD-10-CM | POA: Diagnosis not present

## 2013-09-24 DIAGNOSIS — N898 Other specified noninflammatory disorders of vagina: Secondary | ICD-10-CM | POA: Diagnosis not present

## 2013-10-14 ENCOUNTER — Encounter: Payer: Self-pay | Admitting: Gastroenterology

## 2013-10-14 ENCOUNTER — Ambulatory Visit (INDEPENDENT_AMBULATORY_CARE_PROVIDER_SITE_OTHER): Payer: Medicare Other | Admitting: Gastroenterology

## 2013-10-14 VITALS — BP 120/70 | HR 76 | Ht 62.5 in | Wt 128.4 lb

## 2013-10-14 DIAGNOSIS — R196 Halitosis: Secondary | ICD-10-CM | POA: Insufficient documentation

## 2013-10-14 DIAGNOSIS — R12 Heartburn: Secondary | ICD-10-CM | POA: Diagnosis not present

## 2013-10-14 DIAGNOSIS — K5900 Constipation, unspecified: Secondary | ICD-10-CM | POA: Insufficient documentation

## 2013-10-14 DIAGNOSIS — R6889 Other general symptoms and signs: Secondary | ICD-10-CM

## 2013-10-14 NOTE — Progress Notes (Signed)
_                                                                                                                History of Present Illness: 58 year old Afro-American female referred for evaluation of halitosis and constipation.  Both have been problems for years.  She may go 4-5 days without a bowel movement.  She has a sense of incomplete evacuation.  With constipation she may develop abdominal discomfort which is relieved with defecation.  She probably underwent colonoscopy within the last 6 months which was normal.  Halitosis has been an ongoing problem for years.  She has lost several jobs because of this.  Recent ENT evaluation including CT of the sinuses was negative.  She denies dysgeusia.  She has pyrosis for which she takes omeprazole.  She denies dysphagia.  She's undergone dental examination without obvious abnormalities.    Past Medical History  Diagnosis Date  . Hypertension   . Moderate mitral regurgitation by prior echocardiogram   . Mitral valve prolapse   . Anxiety   . GERD (gastroesophageal reflux disease)   . Bipolar 1 disorder   . Osteoarthritis   . Tobacco abuse   . Rotator cuff syndrome   . Chest pain   . Anemia   . Depression   . Alcoholism    Past Surgical History  Procedure Laterality Date  . Posttonsillectomy hemmorhage    . Tonsillectomy    . Cervical fusion    . Cesarean section     family history includes Bone cancer in her paternal grandfather; Cirrhosis in her paternal uncle; Colon polyps in her father; Esophageal cancer in her maternal uncle; Heart attack in her father; Heart disease in her mother; Liver cancer in her paternal uncle; Other in her maternal aunt; Skin cancer in her father; Uterine cancer in her paternal grandmother. Current Outpatient Prescriptions  Medication Sig Dispense Refill  . amLODipine (NORVASC) 5 MG tablet Take 1 tablet by mouth daily.      Marland Kitchen buPROPion (WELLBUTRIN XL) 300 MG 24 hr tablet Take 300 mg by mouth  every morning.       . clonazePAM (KLONOPIN) 1 MG tablet Take 1 mg by mouth 3 (three) times daily as needed for anxiety.       . cyclobenzaprine (FLEXERIL) 10 MG tablet as needed.      Marland Kitchen FIBER PO Take 1 capsule by mouth daily.      Marland Kitchen gabapentin (NEURONTIN) 300 MG capsule Take 300 mg by mouth 2 (two) times daily as needed (for nerve pain.).      Marland Kitchen HYDROcodone-acetaminophen (VICODIN) 5-500 MG per tablet Take 1 tablet by mouth every 6 (six) hours as needed. For pain      . lamoTRIgine (LAMICTAL) 25 MG tablet Take 50 mg by mouth at bedtime.       Marland Kitchen lisinopril (PRINIVIL,ZESTRIL) 20 MG tablet Take 20 mg by mouth every morning.       Marland Kitchen losartan (COZAAR) 100 MG tablet Take 100 mg by  mouth every morning.       . meloxicam (MOBIC) 15 MG tablet Take 1 tablet by mouth daily.      . Multiple Vitamins-Minerals (ZINC PO) Take 1 tablet by mouth daily.      Marland Kitchen omeprazole (PRILOSEC) 20 MG capsule Take 20 mg by mouth every morning.      Marland Kitchen OVER THE COUNTER MEDICATION Take 1 capsule by mouth 2 (two) times daily. Chloraphyll capsule.      . vitamin C (ASCORBIC ACID) 500 MG tablet Take 500 mg by mouth daily.       No current facility-administered medications for this visit.   Allergies as of 10/14/2013 - Review Complete 10/14/2013  Allergen Reaction Noted  . Lactose intolerance (gi)  10/14/2013  . Milk-related compounds  10/14/2013    reports that she has been smoking Cigarettes.  She has a 30 pack-year smoking history. She has never used smokeless tobacco. She reports that she drinks about .6 ounces of alcohol per week. She reports that she does not use illicit drugs.     Review of Systems: Pertinent positive and negative review of systems were noted in the above HPI section. All other review of systems were otherwise negative.  Vital signs were reviewed in today's medical record Physical Exam: General: Well developed , well nourished, no acute distress Skin: anicteric Head: Normocephalic and  atraumatic Eyes:  sclerae anicteric, EOMI Ears: Normal auditory acuity Mouth: No deformity or lesions Neck: Supple, no masses or thyromegaly Lungs: Clear throughout to auscultation Heart: Regular rate and rhythm; no murmurs, rubs or bruits Abdomen: Soft, non tender and non distended. No masses, hepatosplenomegaly or hernias noted. Normal Bowel sounds.  There is no succussion splash Rectal:deferred Musculoskeletal: Symmetrical with no gross deformities  Skin: No lesions on visible extremities Pulses:  Normal pulses noted Extremities: No clubbing, cyanosis, edema or deformities noted Neurological: Alert oriented x 4, grossly nonfocal Cervical Nodes:  No significant cervical adenopathy Inguinal Nodes: No significant inguinal adenopathy Psychological:  Alert and cooperative. Normal mood and affect  See Assessment and Plan under Problem List

## 2013-10-14 NOTE — Assessment & Plan Note (Addendum)
The patient appears to have chronic idiopathic constipation.  Recent colonoscopy was negative.  Recommendations #1 patient will consider enrollment in a constipation trial.  Failing this, I would consider initial therapy with fiber supplementation and MiraLax #2 I s obtained recent colonoscopy report

## 2013-10-14 NOTE — Patient Instructions (Signed)

## 2013-10-14 NOTE — Assessment & Plan Note (Signed)
Prior workup with ENT and dental examinations have been negative.  Halitosis could be related to constipation.  Gastroparesis, upper GI pathology including peptic disease, H. pylori infection, infectious esophagitis should be ruled out.  Recommendations #1 upper endoscopy #2 if negative and not improved with alleviation of constipation would consider gastric emptying scan

## 2013-10-15 ENCOUNTER — Other Ambulatory Visit: Payer: Self-pay | Admitting: *Deleted

## 2013-10-22 ENCOUNTER — Ambulatory Visit (AMBULATORY_SURGERY_CENTER): Payer: Medicare Other | Admitting: Gastroenterology

## 2013-10-22 ENCOUNTER — Encounter: Payer: Self-pay | Admitting: Gastroenterology

## 2013-10-22 VITALS — BP 145/76 | HR 68 | Temp 96.7°F | Resp 16 | Ht 62.0 in | Wt 128.0 lb

## 2013-10-22 DIAGNOSIS — K219 Gastro-esophageal reflux disease without esophagitis: Secondary | ICD-10-CM | POA: Diagnosis not present

## 2013-10-22 DIAGNOSIS — R12 Heartburn: Secondary | ICD-10-CM | POA: Diagnosis not present

## 2013-10-22 DIAGNOSIS — I10 Essential (primary) hypertension: Secondary | ICD-10-CM | POA: Diagnosis not present

## 2013-10-22 MED ORDER — SODIUM CHLORIDE 0.9 % IV SOLN: 500.0000 mL | INTRAVENOUS | Status: DC

## 2013-10-22 NOTE — Op Note (Signed)
[place]  [address]. [place], [address]   ENDOSCOPY PROCEDURE REPORT  PATIENT: Nichole Gibbs, Nichole Gibbs  MR#: [id number] BIRTHDATE: [birth year] , 78  yrs. old GENDER: Female ENDOSCOPIST: Inda Castle, MD REFERRED BY:  Carol Ada, M.D. PROCEDURE DATE:  10/22/2013 PROCEDURE:  EGD, diagnostic ASA CLASS:     Class II INDICATIONS:  halitosis. MEDICATIONS: MAC sedation, administered by CRNA, propofol (Diprivan) 100mg  IV, and Simethicone 0.6cc PO TOPICAL ANESTHETIC: Cetacaine Spray  DESCRIPTION OF PROCEDURE: After the risks benefits and alternatives of the procedure were thoroughly explained, informed consent was obtained.  The LB [id number] endoscope was introduced through the mouth and advanced to the third portion of the duodenum. Without limitations.  The instrument was slowly withdrawn as the mucosa was fully examined.      The upper, middle and distal third of the esophagus were carefully inspected and no abnormalities were noted.  The z-line was well seen at the GEJ.  The endoscope was pushed into the fundus which was normal including a retroflexed view.  The antrum, gastric body, first and second part of the duodenum were unremarkable. Retroflexed views revealed no abnormalities.     The scope was then withdrawn from the patient and the procedure completed.  COMPLICATIONS: There were no complications. ENDOSCOPIC IMPRESSION: Normal EGD  RECOMMENDATIONS: 1.  Begin Linzess 145ug qd 2.  if symptoms are not improves as constipation subsides, will schedule GES REPEAT EXAM:  eSigned:  Inda Castle, MD 10/22/2013 11:26 AM   CC:

## 2013-10-22 NOTE — Patient Instructions (Addendum)
Begin Linzess 1 tab daily.  Call office in 1 week to report progress in constipation.  Call office and stop Linzess if diarrhea occurs.  YOU HAD AN ENDOSCOPIC PROCEDURE TODAY AT [facility]: Refer to the procedure report that was given to you for any specific questions about what was found during the examination.  If the procedure report does not answer your questions, please call your gastroenterologist to clarify.  If you requested that your care partner not be given the details of your procedure findings, then the procedure report has been included in a sealed envelope for you to review at your convenience later.  YOU SHOULD EXPECT: Some feelings of bloating in the abdomen. Passage of more gas than usual.  Walking can help get rid of the air that was put into your GI tract during the procedure and reduce the bloating. If you had a lower endoscopy (such as a colonoscopy or flexible sigmoidoscopy) you may notice spotting of blood in your stool or on the toilet paper. If you underwent a bowel prep for your procedure, then you may not have a normal bowel movement for a few days.  DIET: Your first meal following the procedure should be a light meal and then it is ok to progress to your normal diet.  A half-sandwich or bowl of soup is an example of a good first meal.  Heavy or fried foods are harder to digest and may make you feel nauseous or bloated.  Likewise meals heavy in dairy and vegetables can cause extra gas to form and this can also increase the bloating.  Drink plenty of fluids but you should avoid alcoholic beverages for 24 hours.  ACTIVITY: Your care partner should take you home directly after the procedure.  You should plan to take it easy, moving slowly for the rest of the day.  You can resume normal activity the day after the procedure however you should NOT DRIVE or use heavy machinery for 24 hours (because of the sedation medicines used during the test).    SYMPTOMS TO REPORT  IMMEDIATELY: A gastroenterologist can be reached at any hour.  During normal business hours, 8:30 AM to 5:00 PM Monday through Friday, call [phone].  After hours and on weekends, please call the GI answering service at [phone] who will take a message and have the physician on call contact you.   Following upper endoscopy (EGD)  Vomiting of blood or coffee ground material  New chest pain or pain under the shoulder blades  Painful or persistently difficult swallowing  New shortness of breath  Fever of 100F or higher  Black, tarry-looking stools  FOLLOW UP: If any biopsies were taken you will be contacted by phone or by letter within the next 1-3 weeks.  Call your gastroenterologist if you have not heard about the biopsies in 3 weeks.  Our staff will call the home number listed on your records the next business day following your procedure to check on you and address any questions or concerns that you may have at that time regarding the information given to you following your procedure. This is a courtesy call and so if there is no answer at the home number and we have not heard from you through the emergency physician on call, we will assume that you have returned to your regular daily activities without incident.  SIGNATURES/CONFIDENTIALITY: You and/or your care partner have signed paperwork which will be entered into your electronic medical record.  These signatures attest to the fact that that the information above on your After Visit Summary has been reviewed and is understood.  Full responsibility of the confidentiality of this discharge information lies with you and/or your care-partner.  Normal exam.

## 2013-10-23 ENCOUNTER — Telehealth: Payer: Self-pay | Admitting: *Deleted

## 2013-10-23 NOTE — Telephone Encounter (Signed)
  Follow up Call-  Call back number 10/22/2013  Post procedure Call Back phone  # [phone]  Permission to leave phone message Yes     Patient questions:  Do you have a fever, pain , or abdominal swelling? no Pain Score  0 *  Have you tolerated food without any problems? yes  Have you been able to return to your normal activities? yes  Do you have any questions about your discharge instructions: Diet   no Medications  no Follow up visit  no  Do you have questions or concerns about your Care? no  Actions: * If pain score is 4 or above: No action needed, pain <4. Pt c/o some diarrhea yesterday ,then talked about her chronic problem with constipation , but no c/o from procedure yesterday. Enouraged pt to try Linzess ordered by Nichole Gibbs for a couple of weeks ,then to let him know how this is working or not so can f/u with next planned step

## 2013-10-29 ENCOUNTER — Telehealth: Payer: Self-pay | Admitting: Gastroenterology

## 2013-10-29 NOTE — Telephone Encounter (Signed)
Left message for pt to call back.  Pt states that the Linzess was causing her to have watery diarrhea and she stopped the linzess because she was having cramping in her hands and she thought she might be getting dehydrated. Discussed with pt that she could try taking the Linzess every other day or every 2 days and see what works best for her. Pt verbalized understanding.

## 2013-11-05 ENCOUNTER — Telehealth: Payer: Self-pay | Admitting: Gastroenterology

## 2013-11-05 MED ORDER — LINACLOTIDE 145 MCG PO CAPS: 145.0000 ug | ORAL_CAPSULE | Freq: Every day | ORAL | Status: DC

## 2013-11-05 NOTE — Telephone Encounter (Signed)
Pt states she is almost out of linzess samples. Script called in for pt until she can be seen in June. Pt was offered an appt for tomorrow but states she cannot come.

## 2013-11-06 ENCOUNTER — Telehealth: Payer: Self-pay | Admitting: Gastroenterology

## 2013-11-06 NOTE — Telephone Encounter (Signed)
Pt states she had liquid dark stools a couple of days ago from the [place]. Pt states the stool was only dark that one time. Instructed pt to call back if she notices this again. Pt verbalized understanding.

## 2013-11-07 ENCOUNTER — Telehealth: Payer: Self-pay | Admitting: Gastroenterology

## 2013-11-07 NOTE — Telephone Encounter (Signed)
Spoke with patient and she states she is now having left side back pain. She has not taken the Linzess yesterday or today but still has back pain. She did take something for pain. The back hurts to bend or walk. She will call her PCP about this. She will hold on Linzess until she sees her PCP for the back pain.

## 2013-12-18 ENCOUNTER — Encounter: Payer: Self-pay | Admitting: Gastroenterology

## 2013-12-18 ENCOUNTER — Ambulatory Visit (INDEPENDENT_AMBULATORY_CARE_PROVIDER_SITE_OTHER): Payer: Medicare Other | Admitting: Gastroenterology

## 2013-12-18 VITALS — BP 100/70 | HR 60 | Ht 62.0 in | Wt 130.2 lb

## 2013-12-18 DIAGNOSIS — R6889 Other general symptoms and signs: Secondary | ICD-10-CM

## 2013-12-18 DIAGNOSIS — K5900 Constipation, unspecified: Secondary | ICD-10-CM | POA: Diagnosis not present

## 2013-12-18 DIAGNOSIS — R196 Halitosis: Secondary | ICD-10-CM

## 2013-12-18 NOTE — Progress Notes (Signed)
          History of Present Illness:  Patient has returned following upper endoscopy which was entirely normal.  On a regimen of lialda she was initially having daily bowel movements.  With this her halitosis seemed to subside.  She eventually developed loose stools and severe cramps necessitating the discontinuation of lialda.    Review of Systems: Pertinent positive and negative review of systems were noted in the above HPI section. All other review of systems were otherwise negative.    Current Medications, Allergies, Past Medical History, Past Surgical History, Family History and Social History were reviewed in [place] record  Vital signs were reviewed in today's medical record. Physical Exam: General: Well developed , well nourished, no acute distress Skin: anicteric   See Assessment and Plan under Problem List

## 2013-12-18 NOTE — Patient Instructions (Signed)
Your prescription of Amitiza is being sent to your pharmacy Follow up in 3 months

## 2013-12-18 NOTE — Assessment & Plan Note (Signed)
Halitosis subsided when she was moving her bowels regularly.

## 2013-12-18 NOTE — Assessment & Plan Note (Signed)
The patient appears to have chronic idiopathic constipation.  Recent colonoscopy was negative.  She initially had a good response to lialda but then developed crampy abdominal pain.  She claims lialda lost its effect.  Recommendations #1 trial of Amitiza 24 mcg twice a day-samples given

## 2013-12-22 DIAGNOSIS — M549 Dorsalgia, unspecified: Secondary | ICD-10-CM | POA: Diagnosis not present

## 2013-12-22 DIAGNOSIS — M2550 Pain in unspecified joint: Secondary | ICD-10-CM | POA: Diagnosis not present

## 2013-12-22 DIAGNOSIS — I10 Essential (primary) hypertension: Secondary | ICD-10-CM | POA: Diagnosis not present

## 2013-12-23 ENCOUNTER — Ambulatory Visit (INDEPENDENT_AMBULATORY_CARE_PROVIDER_SITE_OTHER): Payer: Medicare Other | Admitting: Cardiovascular Disease

## 2013-12-23 ENCOUNTER — Encounter: Payer: Self-pay | Admitting: Cardiovascular Disease

## 2013-12-23 VITALS — BP 133/78 | HR 77 | Ht 62.0 in | Wt 130.1 lb

## 2013-12-23 DIAGNOSIS — I080 Rheumatic disorders of both mitral and aortic valves: Secondary | ICD-10-CM | POA: Diagnosis not present

## 2013-12-23 DIAGNOSIS — I10 Essential (primary) hypertension: Secondary | ICD-10-CM

## 2013-12-23 MED ORDER — AMOXICILLIN 500 MG PO TABS: ORAL_TABLET | ORAL | Status: DC

## 2013-12-23 NOTE — Patient Instructions (Signed)
Your physician has requested that you have an echocardiogram. Echocardiography is a painless test that uses sound waves to create images of your heart. It provides your doctor with information about the size and shape of your heart and how well your heart's chambers and valves are working. This procedure takes approximately one hour. There are no restrictions for this procedure.  Your physician recommends that you continue on your current medications as directed. Please refer to the Current Medication list given to you today.  Your physician wants you to follow-up in: 1 YEAR with Dr Cooper.  You will receive a reminder letter in the mail two months in advance. If you don't receive a letter, please call our office to schedule the follow-up appointment.    

## 2013-12-23 NOTE — Progress Notes (Signed)
HPI:  Nichole Gibbs returns for cardiac followup. She is followed for mitral valve prolapse with mitral regurgitation. She also has essential hypertension. She reports stable dyspnea with exertion involving moderate level activities. She's had no orthopnea, PND, edema, or heart palpitations. She's been compliant with her medications. She has developed diffuse joint pains. No fevers, chills, or other complaints. Her last echocardiogram was one year ago.  Outpatient Encounter Prescriptions as of 12/23/2013  Medication Sig  . amLODipine (NORVASC) 5 MG tablet Take 1 tablet by mouth daily.  Marland Kitchen buPROPion (WELLBUTRIN XL) 300 MG 24 hr tablet Take 300 mg by mouth every morning.   . clonazePAM (KLONOPIN) 1 MG tablet Take 1 mg by mouth 3 (three) times daily as needed for anxiety.   . cyclobenzaprine (FLEXERIL) 10 MG tablet as needed.  Marland Kitchen FIBER PO Take 1 capsule by mouth daily.  . fluconazole (DIFLUCAN) 150 MG tablet   . gabapentin (NEURONTIN) 300 MG capsule Take 300 mg by mouth 2 (two) times daily as needed (for nerve pain.).  Marland Kitchen HYDROcodone-acetaminophen (VICODIN) 5-500 MG per tablet Take 1 tablet by mouth every 6 (six) hours as needed. For pain  . lamoTRIgine (LAMICTAL) 25 MG tablet Take 50 mg by mouth at bedtime.   Marland Kitchen lisinopril (PRINIVIL,ZESTRIL) 20 MG tablet Take 20 mg by mouth every morning.   Marland Kitchen losartan (COZAAR) 100 MG tablet Take 100 mg by mouth every morning.   . meloxicam (MOBIC) 15 MG tablet Take 1 tablet by mouth daily.  . Multiple Vitamins-Minerals (ZINC PO) Take 1 tablet by mouth daily.  Marland Kitchen omeprazole (PRILOSEC) 20 MG capsule Take 20 mg by mouth every morning.  Marland Kitchen OVER THE COUNTER MEDICATION Take 1 capsule by mouth 2 (two) times daily. Chloraphyll capsule.  . polyethylene glycol powder (GLYCOLAX/MIRALAX) powder   . vitamin C (ASCORBIC ACID) 500 MG tablet Take 500 mg by mouth daily.  . [DISCONTINUED] Linaclotide (LINZESS) 145 MCG CAPS capsule Take 1 capsule (145 mcg total) by mouth daily.     Allergies  Allergen Reactions  . Lactose Intolerance (Gi)   . Milk-Related Compounds     Past Medical History  Diagnosis Date  . Hypertension   . Moderate mitral regurgitation by prior echocardiogram   . Mitral valve prolapse   . Anxiety   . GERD (gastroesophageal reflux disease)   . Bipolar 1 disorder   . Osteoarthritis   . Tobacco abuse   . Rotator cuff syndrome   . Chest pain   . Anemia   . Depression   . Alcoholism     ROS: Negative except as per HPI  BP 133/78  Pulse 77  Ht 5\' 2"  (1.575 m)  Wt 59.022 kg (130 lb 1.9 oz)  BMI 23.79 kg/m2  PHYSICAL EXAM: Pt is alert and oriented, NAD HEENT: normal Neck: JVP - normal, carotids 2+= without bruits Lungs: CTA bilaterally CV: RRR with grade 3/6 holosystolic murmur heard best at the LV apex in the supine position. Abd: soft, NT, Positive BS, no hepatomegaly Ext: no C/C/E, distal pulses intact and equal Skin: warm/dry no rash  EKG:  Normal sinus rhythm, left atrial enlargement, nonspecific ST abnormality. Heart rate 76 beats per minute.  2-D echocardiogram May 2014: Study Conclusions  - Left ventricle: The cavity size was normal. Wall thickness was normal. Systolic function was normal. The estimated ejection fraction was in the range of 55% to 60%. - Mitral valve: Moderate regurgitation. - Left atrium: The atrium was mildly dilated.  Myoview stress nuclear  study May 2014: Impression  Exercise Capacity: Lexiscan with no exercise.  BP Response: Normal blood pressure response.  Clinical Symptoms: No significant symptoms noted.  ECG Impression: No significant ST segment change suggestive of ischemia.  Comparison with Prior Nuclear Study: No images to compare  Overall Impression: Normal stress nuclear study. No evidence of ischemia.  LV Ejection Fraction: 62%. LV Wall Motion: NL LV Function; NL Wall Motion.   ASSESSMENT AND PLAN: Mitral valve prolapse with at least moderate mitral regurgitation. The patient  has stable exertional dyspnea. In going to repeat her echocardiogram to reassess the degree of mitral regurgitation. Studies from last year were reviewed today. We discussed the natural history of mitral valve prolapse/mitral regurgitation and potential surgical options as we see changes in the LV size or function and/or symptom progression. Amoxicillin was written for SBE prophylaxis.   Nichole Gibbs 12/23/2013 11:32 AM

## 2013-12-30 DIAGNOSIS — N898 Other specified noninflammatory disorders of vagina: Secondary | ICD-10-CM | POA: Diagnosis not present

## 2013-12-30 DIAGNOSIS — R6889 Other general symptoms and signs: Secondary | ICD-10-CM | POA: Diagnosis not present

## 2013-12-30 DIAGNOSIS — Z113 Encounter for screening for infections with a predominantly sexual mode of transmission: Secondary | ICD-10-CM | POA: Diagnosis not present

## 2014-01-07 ENCOUNTER — Ambulatory Visit (HOSPITAL_COMMUNITY): Payer: Medicare Other | Attending: Cardiovascular Disease | Admitting: Radiology

## 2014-01-07 DIAGNOSIS — I059 Rheumatic mitral valve disease, unspecified: Secondary | ICD-10-CM | POA: Insufficient documentation

## 2014-01-07 DIAGNOSIS — R0609 Other forms of dyspnea: Secondary | ICD-10-CM | POA: Diagnosis not present

## 2014-01-07 DIAGNOSIS — F1010 Alcohol abuse, uncomplicated: Secondary | ICD-10-CM | POA: Insufficient documentation

## 2014-01-07 DIAGNOSIS — R011 Cardiac murmur, unspecified: Secondary | ICD-10-CM | POA: Diagnosis not present

## 2014-01-07 DIAGNOSIS — F17200 Nicotine dependence, unspecified, uncomplicated: Secondary | ICD-10-CM | POA: Insufficient documentation

## 2014-01-07 DIAGNOSIS — R0989 Other specified symptoms and signs involving the circulatory and respiratory systems: Secondary | ICD-10-CM | POA: Insufficient documentation

## 2014-01-07 DIAGNOSIS — I10 Essential (primary) hypertension: Secondary | ICD-10-CM

## 2014-01-07 DIAGNOSIS — I080 Rheumatic disorders of both mitral and aortic valves: Secondary | ICD-10-CM

## 2014-01-07 HISTORY — PX: TRANSTHORACIC ECHOCARDIOGRAM: SHX275

## 2014-01-07 NOTE — Progress Notes (Signed)
Echocardiogram performed.  

## 2014-01-27 DIAGNOSIS — F319 Bipolar disorder, unspecified: Secondary | ICD-10-CM | POA: Diagnosis not present

## 2014-01-30 DIAGNOSIS — M549 Dorsalgia, unspecified: Secondary | ICD-10-CM | POA: Diagnosis not present

## 2014-01-30 DIAGNOSIS — K219 Gastro-esophageal reflux disease without esophagitis: Secondary | ICD-10-CM | POA: Diagnosis not present

## 2014-01-30 DIAGNOSIS — F411 Generalized anxiety disorder: Secondary | ICD-10-CM | POA: Diagnosis not present

## 2014-02-25 DIAGNOSIS — F319 Bipolar disorder, unspecified: Secondary | ICD-10-CM | POA: Diagnosis not present

## 2014-03-23 DIAGNOSIS — E162 Hypoglycemia, unspecified: Secondary | ICD-10-CM | POA: Diagnosis not present

## 2014-03-27 ENCOUNTER — Encounter (HOSPITAL_BASED_OUTPATIENT_CLINIC_OR_DEPARTMENT_OTHER): Payer: Self-pay | Admitting: Emergency Medicine

## 2014-03-27 ENCOUNTER — Emergency Department (HOSPITAL_BASED_OUTPATIENT_CLINIC_OR_DEPARTMENT_OTHER): Payer: Medicare Other

## 2014-03-27 ENCOUNTER — Emergency Department (HOSPITAL_BASED_OUTPATIENT_CLINIC_OR_DEPARTMENT_OTHER)
Admission: EM | Admit: 2014-03-27 | Discharge: 2014-03-27 | Disposition: A | Discharge status: Home / Self Care | Payer: Medicare Other | Attending: Emergency Medicine | Admitting: Emergency Medicine

## 2014-03-27 DIAGNOSIS — Z791 Long term (current) use of non-steroidal anti-inflammatories (NSAID): Secondary | ICD-10-CM | POA: Diagnosis not present

## 2014-03-27 DIAGNOSIS — S82001A Unspecified fracture of right patella, initial encounter for closed fracture: Secondary | ICD-10-CM | POA: Diagnosis not present

## 2014-03-27 DIAGNOSIS — Z87891 Personal history of nicotine dependence: Secondary | ICD-10-CM | POA: Diagnosis not present

## 2014-03-27 DIAGNOSIS — Z79899 Other long term (current) drug therapy: Secondary | ICD-10-CM | POA: Insufficient documentation

## 2014-03-27 DIAGNOSIS — Z79891 Long term (current) use of opiate analgesic: Secondary | ICD-10-CM | POA: Insufficient documentation

## 2014-03-27 DIAGNOSIS — M1990 Unspecified osteoarthritis, unspecified site: Secondary | ICD-10-CM | POA: Insufficient documentation

## 2014-03-27 DIAGNOSIS — Y93K1 Activity, walking an animal: Secondary | ICD-10-CM | POA: Diagnosis not present

## 2014-03-27 DIAGNOSIS — M25561 Pain in right knee: Secondary | ICD-10-CM | POA: Diagnosis not present

## 2014-03-27 DIAGNOSIS — F419 Anxiety disorder, unspecified: Secondary | ICD-10-CM | POA: Insufficient documentation

## 2014-03-27 DIAGNOSIS — Y9389 Activity, other specified: Secondary | ICD-10-CM | POA: Insufficient documentation

## 2014-03-27 DIAGNOSIS — M21261 Flexion deformity, right knee: Secondary | ICD-10-CM | POA: Diagnosis not present

## 2014-03-27 DIAGNOSIS — F319 Bipolar disorder, unspecified: Secondary | ICD-10-CM | POA: Insufficient documentation

## 2014-03-27 DIAGNOSIS — W010XXA Fall on same level from slipping, tripping and stumbling without subsequent striking against object, initial encounter: Secondary | ICD-10-CM | POA: Diagnosis not present

## 2014-03-27 DIAGNOSIS — I10 Essential (primary) hypertension: Secondary | ICD-10-CM | POA: Diagnosis not present

## 2014-03-27 DIAGNOSIS — Z8679 Personal history of other diseases of the circulatory system: Secondary | ICD-10-CM | POA: Insufficient documentation

## 2014-03-27 DIAGNOSIS — S8991XA Unspecified injury of right lower leg, initial encounter: Secondary | ICD-10-CM | POA: Diagnosis present

## 2014-03-27 MED ORDER — ONDANSETRON HCL 4 MG/2ML IJ SOLN
4.0000 mg | Freq: Once | INTRAMUSCULAR | Status: AC
  Administered 2014-03-27: 4 mg via INTRAVENOUS
  Filled 2014-03-27: qty 2

## 2014-03-27 MED ORDER — HYDROMORPHONE HCL 1 MG/ML IJ SOLN
0.5000 mg | Freq: Once | INTRAMUSCULAR | Status: AC
  Administered 2014-03-27: 0.5 mg via INTRAVENOUS
  Filled 2014-03-27: qty 1

## 2014-03-27 NOTE — ED Notes (Signed)
MD at bedside. 

## 2014-03-27 NOTE — ED Provider Notes (Addendum)
CSN: [id number]     Arrival date & time 03/27/14  1016 History   First MD Initiated Contact with Patient 03/27/14 1018     Chief Complaint  Patient presents with  . Dislocation     (Consider location/radiation/quality/duration/timing/severity/associated sxs/prior Treatment) Patient is a 58 y.o. female presenting with knee pain. The history is provided by the patient.  Knee Pain Location:  Knee Time since incident:  1 hour Injury: yes   Mechanism of injury: fall   Fall:    Fall occurred:  From an [employer] education careers [employer] (Patient went out to walk her dog and slipped on the wet pavement causing her to fall)   Impact surface:  Product manager of impact:  Knees Knee location:  R knee Pain details:    Quality:  Aching, sharp and throbbing   Radiates to:  Does not radiate   Severity:  Moderate   Onset quality:  Sudden   Timing:  Constant   Progression:  Improving Chronicity:  New Dislocation: yes   Prior injury to area:  No Relieved by:  Rest and immobilization Worsened by:  Flexion and bearing weight Ineffective treatments:  None tried Associated symptoms: decreased ROM and swelling   Associated symptoms: no muscle weakness, no numbness and no tingling     Past Medical History  Diagnosis Date  . Hypertension   . Moderate mitral regurgitation by prior echocardiogram   . Mitral valve prolapse   . Anxiety   . GERD (gastroesophageal reflux disease)   . Bipolar 1 disorder   . Osteoarthritis   . Tobacco abuse   . Rotator cuff syndrome   . Chest pain   . Anemia   . Depression   . Alcoholism    Past Surgical History  Procedure Laterality Date  . Posttonsillectomy hemmorhage    . Tonsillectomy    . Cervical fusion    . Cesarean section     Family History  Problem Relation Age of Onset  . Heart disease Mother   . Heart attack Father   . Colon polyps Father   . Skin cancer Father   . Esophageal cancer Maternal Uncle   . Other Maternal Aunt     stomcah polyps  . Bone cancer  Paternal Grandfather   . Uterine cancer Paternal Grandmother   . Liver cancer Paternal Uncle   . Cirrhosis Paternal Uncle    History  Substance Use Topics  . Smoking status: Former Smoker -- 1.00 packs/day for 30 years    Types: Cigarettes  . Smokeless tobacco: Never Used     Comment: uses hooka  . Alcohol Use: 0.6 oz/week    1 Cans of beer per week     Comment: occasional beer   OB History   Grav Para Term Preterm Abortions TAB SAB Ect Mult Living                 Review of Systems  All other systems reviewed and are negative.     Allergies  Lactose intolerance (gi) and Milk-related compounds  Home Medications   Prior to Admission medications   Medication Sig Start Date End Date Taking? Authorizing Provider  amLODipine (NORVASC) 5 MG tablet Take 1 tablet by mouth daily. 10/01/12   Historical Provider, MD  amoxicillin (AMOXIL) 500 MG tablet Take 4 tablets one hour prior to dental appointment 12/23/13   Sherren Mocha, MD  buPROPion (WELLBUTRIN XL) 300 MG 24 hr tablet Take 300 mg by mouth every morning.  Historical Provider, MD  clonazePAM (KLONOPIN) 1 MG tablet Take 1 mg by mouth 3 (three) times daily as needed for anxiety.     Historical Provider, MD  cyclobenzaprine (FLEXERIL) 10 MG tablet as needed. 10/16/12   Historical Provider, MD  FIBER PO Take 1 capsule by mouth daily.    Historical Provider, MD  fluconazole (DIFLUCAN) 150 MG tablet  09/24/13   Historical Provider, MD  gabapentin (NEURONTIN) 300 MG capsule Take 300 mg by mouth 2 (two) times daily as needed (for nerve pain.).    Historical Provider, MD  HYDROcodone-acetaminophen (VICODIN) 5-500 MG per tablet Take 1 tablet by mouth every 6 (six) hours as needed. For pain    Historical Provider, MD  lamoTRIgine (LAMICTAL) 25 MG tablet Take 50 mg by mouth at bedtime.     Historical Provider, MD  lisinopril (PRINIVIL,ZESTRIL) 20 MG tablet Take 20 mg by mouth every morning.     Historical Provider, MD  losartan (COZAAR) 100  MG tablet Take 100 mg by mouth every morning.     Historical Provider, MD  meloxicam (MOBIC) 15 MG tablet Take 1 tablet by mouth daily. 10/16/12   Historical Provider, MD  Multiple Vitamins-Minerals (ZINC PO) Take 1 tablet by mouth daily.    Historical Provider, MD  omeprazole (PRILOSEC) 20 MG capsule Take 20 mg by mouth every morning.    Historical Provider, MD  OVER THE COUNTER MEDICATION Take 1 capsule by mouth 2 (two) times daily. Chloraphyll capsule.    Historical Provider, MD  polyethylene glycol powder (GLYCOLAX/MIRALAX) powder  07/16/13   Historical Provider, MD  vitamin C (ASCORBIC ACID) 500 MG tablet Take 500 mg by mouth daily.    Historical Provider, MD   BP 135/75  Pulse 69  Temp(Src) 98 F (36.7 C) (Oral)  Resp 17  Ht 5\' 2"  (1.575 m)  Wt 134 lb (60.782 kg)  BMI 24.50 kg/m2  SpO2 100% Physical Exam  Nursing note and vitals reviewed. Constitutional: She is oriented to person, place, and time. She appears well-developed and well-nourished. No distress.  HENT:  Head: Normocephalic and atraumatic.  Cardiovascular: Normal rate and intact distal pulses.   Pulmonary/Chest: Effort normal.  Musculoskeletal:       Right knee: She exhibits decreased range of motion, swelling and effusion. She exhibits no ecchymosis and no deformity. Tenderness found. Medial joint line, lateral joint line and patellar tendon tenderness noted.  Neurological: She is alert and oriented to person, place, and time.  Skin: Skin is warm and dry.  Psychiatric: She has a normal mood and affect. Her behavior is normal.    ED Course  Procedures (including critical care time) Labs Review Labs Reviewed - No data to display  Imaging Review Dg Knee Complete 4 Views Right  03/27/2014   CLINICAL DATA:  Fall onto right knee this morning with twisting. Pain and swelling.  EXAM: RIGHT KNEE - COMPLETE 4+ VIEW  COMPARISON:  None.  FINDINGS: There is a horizontal fracture through the mid patella. There is additional  vertical fracture through the superior aspect of the patella. There is distraction of the superior fracture fragment from the inferior fraction fragment by approximately 1.8 cm. There is a suprapatellar joint effusion. There is pre patellar soft tissue swelling.  No evidence of tibial fracture  IMPRESSION: Complex distracted fracture of the patella.   Electronically Signed   By: Suzy Bouchard M.D.   On: 03/27/2014 11:09     EKG Interpretation None      MDM  Final diagnoses:  Patellar fracture, right, closed, initial encounter    Patient with an injury today consistent with a patellar dislocation. Patient spontaneously reduced before arrival in the emergency room. Significant swelling of the right knee but denies any numbness or tingling of the right foot. 2+ dorsalis pedis and posterior pulses. Patient had a photo of her knee on her phone prior to reduction which was consistent with a patellar dislocation. Plain films pending for further evaluation. No other area of injury  11:28 AM Plain films show patellar fracture.  Patient placed in a knee immobilizer and put on crutches. Patient has an appointment with Dr. Clerance Lav next week and recommended that she call the office on Monday for followup. She R. he has pain medication at home and states she does not need any  Blanchie Dessert, MD 03/27/14 [place], MD 03/27/14 1131

## 2014-03-27 NOTE — ED Notes (Signed)
Pt reports she was walking down her steps outside and fell onto right knee.  Picture pt took shows kneecap in 2 separate locations.  On arrival pts knee has swelling and is painful.  Unable to bend or move knee.  NAD triage.  No loss of LOC during fall.

## 2014-03-30 ENCOUNTER — Encounter (HOSPITAL_BASED_OUTPATIENT_CLINIC_OR_DEPARTMENT_OTHER): Payer: Self-pay | Admitting: *Deleted

## 2014-03-30 DIAGNOSIS — S82031A Displaced transverse fracture of right patella, initial encounter for closed fracture: Secondary | ICD-10-CM | POA: Diagnosis not present

## 2014-04-01 ENCOUNTER — Encounter (HOSPITAL_BASED_OUTPATIENT_CLINIC_OR_DEPARTMENT_OTHER): Payer: Self-pay | Admitting: *Deleted

## 2014-04-01 ENCOUNTER — Other Ambulatory Visit: Payer: Self-pay | Admitting: Orthopedic Surgery

## 2014-04-01 NOTE — Progress Notes (Signed)
Pt instructed npo p mn x omeprazole, neurontin.  Ok to have clear liquids until 1000.  Then absolutely nothing by mouth, . including no candy, mint, gum, water, food, hookra.  Pt verbalized her understanding. To [facility] @ 1400.  Needs istat on arrival.  ekg in chart.  Pt aware that she will be spending the night @ [facility].

## 2014-04-03 ENCOUNTER — Encounter (HOSPITAL_BASED_OUTPATIENT_CLINIC_OR_DEPARTMENT_OTHER): Payer: Medicare Other | Admitting: Anesthesiology

## 2014-04-03 ENCOUNTER — Observation Stay (HOSPITAL_BASED_OUTPATIENT_CLINIC_OR_DEPARTMENT_OTHER)
Admission: RE | Admit: 2014-04-03 | Discharge: 2014-04-04 | Disposition: A | Discharge status: Home / Self Care | Payer: Medicare Other | Source: Ambulatory Visit | Attending: Specialist | Admitting: Specialist

## 2014-04-03 ENCOUNTER — Encounter (HOSPITAL_COMMUNITY)
Admission: RE | Disposition: A | Discharge status: Home / Self Care | Payer: Self-pay | Source: Ambulatory Visit | Attending: Specialist

## 2014-04-03 ENCOUNTER — Encounter (HOSPITAL_BASED_OUTPATIENT_CLINIC_OR_DEPARTMENT_OTHER): Payer: Self-pay | Admitting: Anesthesiology

## 2014-04-03 ENCOUNTER — Ambulatory Visit (HOSPITAL_BASED_OUTPATIENT_CLINIC_OR_DEPARTMENT_OTHER): Payer: Medicare Other | Admitting: Anesthesiology

## 2014-04-03 DIAGNOSIS — Z886 Allergy status to analgesic agent status: Secondary | ICD-10-CM | POA: Insufficient documentation

## 2014-04-03 DIAGNOSIS — Z87891 Personal history of nicotine dependence: Secondary | ICD-10-CM | POA: Diagnosis not present

## 2014-04-03 DIAGNOSIS — F319 Bipolar disorder, unspecified: Secondary | ICD-10-CM | POA: Diagnosis not present

## 2014-04-03 DIAGNOSIS — I341 Nonrheumatic mitral (valve) prolapse: Secondary | ICD-10-CM | POA: Diagnosis not present

## 2014-04-03 DIAGNOSIS — I10 Essential (primary) hypertension: Secondary | ICD-10-CM | POA: Diagnosis not present

## 2014-04-03 DIAGNOSIS — S82011A Displaced osteochondral fracture of right patella, initial encounter for closed fracture: Secondary | ICD-10-CM | POA: Diagnosis not present

## 2014-04-03 DIAGNOSIS — W19XXXA Unspecified fall, initial encounter: Secondary | ICD-10-CM | POA: Diagnosis not present

## 2014-04-03 DIAGNOSIS — G8918 Other acute postprocedural pain: Secondary | ICD-10-CM | POA: Diagnosis not present

## 2014-04-03 DIAGNOSIS — M25561 Pain in right knee: Secondary | ICD-10-CM | POA: Diagnosis not present

## 2014-04-03 DIAGNOSIS — S82001A Unspecified fracture of right patella, initial encounter for closed fracture: Secondary | ICD-10-CM | POA: Diagnosis not present

## 2014-04-03 DIAGNOSIS — M1990 Unspecified osteoarthritis, unspecified site: Secondary | ICD-10-CM | POA: Diagnosis not present

## 2014-04-03 DIAGNOSIS — F419 Anxiety disorder, unspecified: Secondary | ICD-10-CM | POA: Insufficient documentation

## 2014-04-03 DIAGNOSIS — S82041A Displaced comminuted fracture of right patella, initial encounter for closed fracture: Principal | ICD-10-CM | POA: Insufficient documentation

## 2014-04-03 DIAGNOSIS — Z79899 Other long term (current) drug therapy: Secondary | ICD-10-CM | POA: Diagnosis not present

## 2014-04-03 DIAGNOSIS — M8580 Other specified disorders of bone density and structure, unspecified site: Secondary | ICD-10-CM | POA: Diagnosis not present

## 2014-04-03 DIAGNOSIS — K219 Gastro-esophageal reflux disease without esophagitis: Secondary | ICD-10-CM | POA: Insufficient documentation

## 2014-04-03 DIAGNOSIS — Z9889 Other specified postprocedural states: Secondary | ICD-10-CM

## 2014-04-03 DIAGNOSIS — D649 Anemia, unspecified: Secondary | ICD-10-CM | POA: Diagnosis not present

## 2014-04-03 DIAGNOSIS — E739 Lactose intolerance, unspecified: Secondary | ICD-10-CM | POA: Insufficient documentation

## 2014-04-03 DIAGNOSIS — Y929 Unspecified place or not applicable: Secondary | ICD-10-CM | POA: Insufficient documentation

## 2014-04-03 HISTORY — DX: Nonrheumatic mitral (valve) insufficiency: I34.0

## 2014-04-03 HISTORY — DX: Other amnesia: R41.3

## 2014-04-03 HISTORY — DX: Unspecified fracture of right patella, initial encounter for closed fracture: S82.001A

## 2014-04-03 HISTORY — PX: PATELLECTOMY: SHX1022

## 2014-04-03 LAB — POCT I-STAT 4, (NA,K, GLUC, HGB,HCT)
Glucose, Bld: 82 mg/dL (ref 70–99)
HCT: 31 % — ABNORMAL LOW (ref 36.0–46.0)
Hemoglobin: 10.5 g/dL — ABNORMAL LOW (ref 12.0–15.0)
Potassium: 4 mEq/L (ref 3.7–5.3)
Sodium: 137 mEq/L (ref 137–147)

## 2014-04-03 SURGERY — PATELLECTOMY
Anesthesia: Regional | Site: Knee | Laterality: Right

## 2014-04-03 MED ORDER — MIDAZOLAM HCL 2 MG/2ML IJ SOLN
2.0000 mg | Freq: Once | INTRAMUSCULAR | Status: AC
  Administered 2014-04-03: 2 mg via INTRAVENOUS
  Filled 2014-04-03: qty 2

## 2014-04-03 MED ORDER — AMLODIPINE BESYLATE 5 MG PO TABS
5.0000 mg | ORAL_TABLET | Freq: Every day | ORAL | Status: DC
  Filled 2014-04-03 (×2): qty 1

## 2014-04-03 MED ORDER — HYDROMORPHONE HCL 1 MG/ML IJ SOLN
0.2500 mg | INTRAMUSCULAR | Status: DC | PRN
  Filled 2014-04-03: qty 1

## 2014-04-03 MED ORDER — ACETAMINOPHEN 650 MG RE SUPP: 650.0000 mg | Freq: Four times a day (QID) | RECTAL | Status: DC | PRN

## 2014-04-03 MED ORDER — CEFAZOLIN SODIUM-DEXTROSE 2-3 GM-% IV SOLR
2.0000 g | INTRAVENOUS | Status: AC
  Administered 2014-04-03: 2 g via INTRAVENOUS
  Filled 2014-04-03: qty 50

## 2014-04-03 MED ORDER — CYCLOBENZAPRINE HCL 10 MG PO TABS
10.0000 mg | ORAL_TABLET | Freq: Three times a day (TID) | ORAL | Status: DC | PRN
  Filled 2014-04-03: qty 1

## 2014-04-03 MED ORDER — HYDROMORPHONE HCL 1 MG/ML IJ SOLN
1.0000 mg | INTRAMUSCULAR | Status: DC | PRN
  Administered 2014-04-03 – 2014-04-04 (×3): 1 mg via INTRAVENOUS
  Filled 2014-04-03 (×4): qty 1

## 2014-04-03 MED ORDER — METOCLOPRAMIDE HCL 5 MG/ML IJ SOLN: 5.0000 mg | Freq: Three times a day (TID) | INTRAMUSCULAR | Status: DC | PRN

## 2014-04-03 MED ORDER — FENTANYL CITRATE 0.05 MG/ML IJ SOLN
INTRAMUSCULAR | Status: AC
  Filled 2014-04-03: qty 2

## 2014-04-03 MED ORDER — ACETAMINOPHEN 325 MG PO TABS
650.0000 mg | ORAL_TABLET | Freq: Four times a day (QID) | ORAL | Status: DC | PRN
Start: 2014-04-03 — End: 2014-04-04

## 2014-04-03 MED ORDER — CYCLOBENZAPRINE HCL 10 MG PO TABS: 10.0000 mg | ORAL_TABLET | Freq: Three times a day (TID) | ORAL | Status: DC | PRN

## 2014-04-03 MED ORDER — PROPOFOL 10 MG/ML IV BOLUS
INTRAVENOUS | Status: DC | PRN
  Administered 2014-04-03: 150 mg via INTRAVENOUS

## 2014-04-03 MED ORDER — CEFAZOLIN SODIUM-DEXTROSE 2-3 GM-% IV SOLR
2.0000 g | Freq: Four times a day (QID) | INTRAVENOUS | Status: AC
  Administered 2014-04-03: 2 g via INTRAVENOUS
  Filled 2014-04-03: qty 50

## 2014-04-03 MED ORDER — MIDAZOLAM HCL 2 MG/2ML IJ SOLN
INTRAMUSCULAR | Status: AC
  Filled 2014-04-03: qty 2

## 2014-04-03 MED ORDER — DOCUSATE SODIUM 100 MG PO CAPS
100.0000 mg | ORAL_CAPSULE | Freq: Two times a day (BID) | ORAL | Status: DC
  Administered 2014-04-03 – 2014-04-04 (×2): 100 mg via ORAL
  Filled 2014-04-03 (×3): qty 1

## 2014-04-03 MED ORDER — ALUM & MAG HYDROXIDE-SIMETH 200-200-20 MG/5ML PO SUSP
30.0000 mL | ORAL | Status: DC | PRN
  Administered 2014-04-04: 30 mL via ORAL
  Filled 2014-04-03: qty 30

## 2014-04-03 MED ORDER — PANTOPRAZOLE SODIUM 40 MG PO TBEC
40.0000 mg | DELAYED_RELEASE_TABLET | Freq: Every day | ORAL | Status: DC
  Administered 2014-04-04: 40 mg via ORAL
  Filled 2014-04-03 (×2): qty 1

## 2014-04-03 MED ORDER — ONDANSETRON HCL 4 MG/2ML IJ SOLN: 4.0000 mg | Freq: Four times a day (QID) | INTRAMUSCULAR | Status: DC | PRN

## 2014-04-03 MED ORDER — DEXAMETHASONE SODIUM PHOSPHATE 4 MG/ML IJ SOLN
INTRAMUSCULAR | Status: DC | PRN
  Administered 2014-04-03: 10 mg via INTRAVENOUS

## 2014-04-03 MED ORDER — ONDANSETRON HCL 4 MG PO TABS: 4.0000 mg | ORAL_TABLET | Freq: Four times a day (QID) | ORAL | Status: DC | PRN

## 2014-04-03 MED ORDER — LOSARTAN POTASSIUM 50 MG PO TABS
100.0000 mg | ORAL_TABLET | Freq: Every morning | ORAL | Status: DC
  Filled 2014-04-03 (×2): qty 2

## 2014-04-03 MED ORDER — LISINOPRIL 20 MG PO TABS
20.0000 mg | ORAL_TABLET | Freq: Every morning | ORAL | Status: DC
  Filled 2014-04-03: qty 1

## 2014-04-03 MED ORDER — POVIDONE-IODINE 7.5 % EX SOLN
Freq: Once | CUTANEOUS | Status: DC
  Filled 2014-04-03: qty 118

## 2014-04-03 MED ORDER — OXYCODONE HCL 5 MG PO TABS: 5.0000 mg | ORAL_TABLET | ORAL | Status: DC | PRN

## 2014-04-03 MED ORDER — OXYCODONE HCL 5 MG/5ML PO SOLN
5.0000 mg | Freq: Once | ORAL | Status: DC | PRN
  Filled 2014-04-03: qty 5

## 2014-04-03 MED ORDER — OXYCODONE-ACETAMINOPHEN 5-325 MG PO TABS: 1.0000 | ORAL_TABLET | ORAL | Status: DC | PRN

## 2014-04-03 MED ORDER — LIDOCAINE HCL (CARDIAC) 20 MG/ML IV SOLN
INTRAVENOUS | Status: DC | PRN
  Administered 2014-04-03: 50 mg via INTRAVENOUS

## 2014-04-03 MED ORDER — MEPERIDINE HCL 25 MG/ML IJ SOLN
6.2500 mg | INTRAMUSCULAR | Status: DC | PRN
  Filled 2014-04-03: qty 1

## 2014-04-03 MED ORDER — CLONIDINE HCL (ANALGESIA) 100 MCG/ML EP SOLN: Freq: Once | EPIDURAL | Status: DC

## 2014-04-03 MED ORDER — METOCLOPRAMIDE HCL 10 MG PO TABS: 5.0000 mg | ORAL_TABLET | Freq: Three times a day (TID) | ORAL | Status: DC | PRN

## 2014-04-03 MED ORDER — OXYCODONE HCL 5 MG PO TABS
5.0000 mg | ORAL_TABLET | Freq: Once | ORAL | Status: DC | PRN
  Filled 2014-04-03: qty 1

## 2014-04-03 MED ORDER — CLONAZEPAM 1 MG PO TABS
1.0000 mg | ORAL_TABLET | Freq: Three times a day (TID) | ORAL | Status: DC | PRN
  Filled 2014-04-03: qty 1

## 2014-04-03 MED ORDER — CEFAZOLIN SODIUM-DEXTROSE 2-3 GM-% IV SOLR
INTRAVENOUS | Status: AC
  Filled 2014-04-03: qty 50

## 2014-04-03 MED ORDER — BUPIVACAINE HCL 0.25 % IJ SOLN
INTRAMUSCULAR | Status: DC | PRN
  Administered 2014-04-03: 10 mL

## 2014-04-03 MED ORDER — LACTATED RINGERS IV SOLN
INTRAVENOUS | Status: DC
  Administered 2014-04-03: 16:00:00 via INTRAVENOUS
  Filled 2014-04-03: qty 1000

## 2014-04-03 MED ORDER — PROMETHAZINE HCL 25 MG/ML IJ SOLN
6.2500 mg | INTRAMUSCULAR | Status: DC | PRN
  Filled 2014-04-03: qty 1

## 2014-04-03 MED ORDER — ASPIRIN EC 325 MG PO TBEC: 325.0000 mg | DELAYED_RELEASE_TABLET | Freq: Two times a day (BID) | ORAL | Status: DC

## 2014-04-03 MED ORDER — INFLUENZA VAC SPLIT QUAD 0.5 ML IM SUSY
0.5000 mL | PREFILLED_SYRINGE | INTRAMUSCULAR | Status: AC
  Administered 2014-04-04: 0.5 mL via INTRAMUSCULAR
  Filled 2014-04-03 (×2): qty 0.5

## 2014-04-03 MED ORDER — ASPIRIN EC 325 MG PO TBEC
325.0000 mg | DELAYED_RELEASE_TABLET | Freq: Two times a day (BID) | ORAL | Status: DC
  Administered 2014-04-03 – 2014-04-04 (×2): 325 mg via ORAL
  Filled 2014-04-03 (×4): qty 1

## 2014-04-03 MED ORDER — ASPIRIN 325 MG PO TBEC: 325.0000 mg | DELAYED_RELEASE_TABLET | Freq: Two times a day (BID) | ORAL | Status: DC

## 2014-04-03 MED ORDER — GABAPENTIN 300 MG PO CAPS
300.0000 mg | ORAL_CAPSULE | Freq: Two times a day (BID) | ORAL | Status: DC | PRN
  Filled 2014-04-03: qty 1

## 2014-04-03 MED ORDER — DIPHENHYDRAMINE HCL 12.5 MG/5ML PO ELIX: 12.5000 mg | ORAL_SOLUTION | ORAL | Status: DC | PRN

## 2014-04-03 MED ORDER — ACETAMINOPHEN 10 MG/ML IV SOLN
INTRAVENOUS | Status: DC | PRN
  Administered 2014-04-03: 1000 mg via INTRAVENOUS

## 2014-04-03 MED ORDER — POTASSIUM CHLORIDE IN NACL 20-0.9 MEQ/L-% IV SOLN
INTRAVENOUS | Status: DC
  Administered 2014-04-03: 21:00:00 via INTRAVENOUS
  Filled 2014-04-03 (×2): qty 1000

## 2014-04-03 MED ORDER — FENTANYL CITRATE 0.05 MG/ML IJ SOLN
100.0000 ug | Freq: Once | INTRAMUSCULAR | Status: AC
  Administered 2014-04-03: 100 ug via INTRAVENOUS
  Filled 2014-04-03: qty 2

## 2014-04-03 SURGICAL SUPPLY — 75 items
BLADE CLIPPER SURG (BLADE) IMPLANT
BLADE SURG 10 STRL SS (BLADE) ×3 IMPLANT
BLADE SURG 15 STRL LF DISP TIS (BLADE) ×3 IMPLANT
BLADE SURG 15 STRL SS (BLADE) ×6
BNDG GAUZE ELAST 4 BULKY (GAUZE/BANDAGES/DRESSINGS) IMPLANT
BRUSH SCRUB DISP (MISCELLANEOUS) IMPLANT
CLEANER CAUTERY TIP 5X5 PAD (MISCELLANEOUS) ×1 IMPLANT
CLOTH BEACON ORANGE TIMEOUT ST (SAFETY) ×3 IMPLANT
COVER MAYO STAND STRL (DRAPES) ×3 IMPLANT
CUFF TOURN SGL QUICK 34 (TOURNIQUET CUFF) ×2
CUFF TRNQT CYL 34X4X40X1 (TOURNIQUET CUFF) ×1 IMPLANT
DERMABOND ADVANCED (GAUZE/BANDAGES/DRESSINGS) ×2
DERMABOND ADVANCED .7 DNX12 (GAUZE/BANDAGES/DRESSINGS) ×1 IMPLANT
DRAPE C-ARM 42X72 X-RAY (DRAPES) IMPLANT
DRAPE INCISE IOBAN 66X45 STRL (DRAPES) ×3 IMPLANT
DRAPE ORTHO SPLIT 77X108 STRL (DRAPES) ×2
DRAPE SURG ORHT 6 SPLT 77X108 (DRAPES) ×1 IMPLANT
DRAPE U-SHAPE 47X51 STRL (DRAPES) ×3 IMPLANT
DRSG ADAPTIC 3X8 NADH LF (GAUZE/BANDAGES/DRESSINGS) IMPLANT
DRSG AQUACEL AG ADV 3.5X 6 (GAUZE/BANDAGES/DRESSINGS) IMPLANT
DRSG AQUACEL AG ADV 3.5X10 (GAUZE/BANDAGES/DRESSINGS) ×3 IMPLANT
ELECT REM PT RETURN 9FT ADLT (ELECTROSURGICAL) ×3
ELECTRODE REM PT RTRN 9FT ADLT (ELECTROSURGICAL) ×1 IMPLANT
EVACUATOR 1/8 PVC DRAIN (DRAIN) IMPLANT
EVACUATOR 3/16  PVC DRAIN (DRAIN)
EVACUATOR 3/16 PVC DRAIN (DRAIN) IMPLANT
GAUZE XEROFORM 1X8 LF (GAUZE/BANDAGES/DRESSINGS) ×3 IMPLANT
GLOVE BIO SURGEON STRL SZ8.5 (GLOVE) ×3 IMPLANT
GLOVE BIOGEL M 6.5 STRL (GLOVE) ×3 IMPLANT
GLOVE BIOGEL PI IND STRL 6.5 (GLOVE) ×1 IMPLANT
GLOVE BIOGEL PI IND STRL 7.5 (GLOVE) ×1 IMPLANT
GLOVE BIOGEL PI IND STRL 8 (GLOVE) ×1 IMPLANT
GLOVE BIOGEL PI IND STRL 8.5 (GLOVE) ×1 IMPLANT
GLOVE BIOGEL PI INDICATOR 6.5 (GLOVE) ×2
GLOVE BIOGEL PI INDICATOR 7.5 (GLOVE) ×2
GLOVE BIOGEL PI INDICATOR 8 (GLOVE) ×2
GLOVE BIOGEL PI INDICATOR 8.5 (GLOVE) ×2
GLOVE SS BIOGEL STRL SZ 8 (GLOVE) ×1 IMPLANT
GLOVE SUPERSENSE BIOGEL SZ 8 (GLOVE) ×2
GOWN STRL NON-REIN LRG LVL3 (GOWN DISPOSABLE) ×3 IMPLANT
GOWN STRL REUS W/TWL 2XL LVL3 (GOWN DISPOSABLE) IMPLANT
GOWN STRL REUS W/TWL XL LVL3 (GOWN DISPOSABLE) ×6 IMPLANT
IMMOBILIZER KNEE 24 THIGH 36 (MISCELLANEOUS) ×1 IMPLANT
IMMOBILIZER KNEE 24 UNIV (MISCELLANEOUS) ×3
NEEDLE HYPO 22GX1.5 SAFETY (NEEDLE) IMPLANT
NS IRRIG 500ML POUR BTL (IV SOLUTION) ×3 IMPLANT
PAD ABD 8X10 STRL (GAUZE/BANDAGES/DRESSINGS) IMPLANT
PAD ARMBOARD 7.5X6 YLW CONV (MISCELLANEOUS) ×6 IMPLANT
PAD CAST 4YDX4 CTTN HI CHSV (CAST SUPPLIES) ×1 IMPLANT
PAD CLEANER CAUTERY TIP 5X5 (MISCELLANEOUS) ×2
PADDING CAST COTTON 4X4 STRL (CAST SUPPLIES) ×2
PADDING CAST COTTON 6X4 STRL (CAST SUPPLIES) ×3 IMPLANT
SPONGE GAUZE 4X4 12PLY STER LF (GAUZE/BANDAGES/DRESSINGS) IMPLANT
SPONGE LAP 18X18 X RAY DECT (DISPOSABLE) ×3 IMPLANT
STAPLER VISISTAT 35W (STAPLE) IMPLANT
STOCKINETTE IMPERVIOUS LG (DRAPES) ×3 IMPLANT
SUCTION FRAZIER TIP 10 FR DISP (SUCTIONS) ×3 IMPLANT
SUT ETHIBOND 2 0 V5 (SUTURE) IMPLANT
SUT FIBERWIRE #2 38 T-5 BLUE (SUTURE) ×9
SUT MNCRL AB 4-0 PS2 18 (SUTURE) ×3 IMPLANT
SUT PDS AB 2-0 CT1 27 (SUTURE) IMPLANT
SUT PROLENE 0 CT 2 (SUTURE) IMPLANT
SUT VIC AB 0 CT1 27 (SUTURE) ×4
SUT VIC AB 0 CT1 27XBRD ANBCTR (SUTURE) ×2 IMPLANT
SUT VIC AB 1 CT1 27 (SUTURE)
SUT VIC AB 1 CT1 27XBRD ANBCTR (SUTURE) IMPLANT
SUT VIC AB 2-0 CT1 27 (SUTURE) ×2
SUT VIC AB 2-0 CT1 TAPERPNT 27 (SUTURE) ×1 IMPLANT
SUTURE FIBERWR #2 38 T-5 BLUE (SUTURE) ×3 IMPLANT
SYR BULB IRRIGATION 50ML (SYRINGE) ×3 IMPLANT
TOWEL OR 17X24 6PK STRL BLUE (TOWEL DISPOSABLE) ×6 IMPLANT
TUBE CONNECTING 12'X1/4 (SUCTIONS) ×1
TUBE CONNECTING 12X1/4 (SUCTIONS) ×2 IMPLANT
WATER STERILE IRR 500ML POUR (IV SOLUTION) IMPLANT
YANKAUER SUCT BULB TIP NO VENT (SUCTIONS) ×3 IMPLANT

## 2014-04-03 NOTE — Transfer of Care (Signed)
Immediate Anesthesia Transfer of Care Note  Patient: Nichole Gibbs  Procedure(s) Performed: Procedure(s): PATELLECTOMY (Right)  Patient Location: PACU  Anesthesia Type:General  Level of Consciousness: awake, alert  and oriented  Airway & Oxygen Therapy: Patient Spontanous Breathing and Patient connected to face mask oxygen  Post-op Assessment: Report given to PACU RN  Post vital signs: Reviewed and stable  Complications: No apparent anesthesia complications

## 2014-04-03 NOTE — Interval H&P Note (Signed)
History and Physical Interval Note:  04/03/2014 3:37 PM  Nichole Gibbs  has presented today for surgery, with the diagnosis of right knee patella fracture  The various methods of treatment have been discussed with the patient and family. After consideration of risks, benefits and other options for treatment, the patient has consented to  Procedure(s): OPEN REDUCTION INTERNAL (ORIF) FIXATION RIGHT KNEE PATELLA VS PATELLECTOMY (Right) as a surgical intervention .  The patient's history has been reviewed, patient examined, no change in status, stable for surgery.  I have reviewed the patient's chart and labs.  Questions were answered to the patient's satisfaction.     [name] ANDREW

## 2014-04-03 NOTE — Anesthesia Postprocedure Evaluation (Signed)
Anesthesia Post Note  Patient: Nichole Gibbs  Procedure(s) Performed: Procedure(s) (LRB): PATELLECTOMY (Right)  Anesthesia type: General  Patient location: PACU  Post pain: Pain level controlled  Post assessment: Post-op Vital signs reviewed  Last Vitals: BP 135/60  Pulse 59  Temp(Src) 36.5 C (Oral)  Resp 18  Ht 5\' 2"  (1.575 m)  Wt 139 lb (63.05 kg)  BMI 25.42 kg/m2  SpO2 100%  Post vital signs: Reviewed  Level of consciousness: sedated  Complications: No apparent anesthesia complications

## 2014-04-03 NOTE — H&P (Signed)
Nichole Gibbs is an 58 y.o. female.   Chief Complaint: Right knee pain HPI: Patient presents with joint discomfort that had been persistent for a few days. Following a fall. Despite conservative treatments, her discomfort has not improved. Imaging was obtained. Other conservative and surgical treatments were discussed in detail. Patient wishes to proceed with surgery as consented. Denies SOB, CP, or calf pain. No Fever, chills, or nausea/ vomiting.   Past Medical History  Diagnosis Date  . Hypertension   . Mitral valve prolapse     cardiologist-   dr Burt Knack  . Anxiety   . GERD (gastroesophageal reflux disease)   . Bipolar 1 disorder   . Osteoarthritis   . Rotator cuff syndrome     right side  . Chest pain     denies at this time 10/7  . Anemia   . Depression   . Right patella fracture   . Moderate mitral regurgitation   . Memory loss     pt states mild memory loss    Past Surgical History  Procedure Laterality Date  . Cesarean section    . Tonsillectomy  09-07-2003  . Cauterization post tonsillectomy  09-25-2003  . Anterior cervical decomp/discectomy fusion  05-19-2009    C4 --  C7  and C5 corpectomy  . Re-exploration anterior cervical wound and evacuation hematoma  05-19-2009  . Cardiovascular stress test  11-20-2012  dr cooper    normal perfusion study/  no ischemia/  ef 62%  . Transthoracic echocardiogram  01-07-2014    grade II diastolic dysfunction/  ef 55-60%/  mild late systolic mitral valve prolapse of anterior leaflet/  moderate MR    Family History  Problem Relation Age of Onset  . Heart disease Mother   . Heart attack Father   . Colon polyps Father   . Skin cancer Father   . Esophageal cancer Maternal Uncle   . Other Maternal Aunt     stomcah polyps  . Bone cancer Paternal Grandfather   . Uterine cancer Paternal Grandmother   . Liver cancer Paternal Uncle   . Cirrhosis Paternal Uncle    Social History:  reports that she has quit smoking. Her  smoking use included Cigarettes. She has a 30 pack-year smoking history. She has never used smokeless tobacco. She reports that she drinks about .6 ounces of alcohol per week. She reports that she does not use illicit drugs.  Allergies:  Allergies  Allergen Reactions  . Ibuprofen Other (See Comments)    Pt states it causes stomach pains d/t hx of ulcers  . Lactose Intolerance (Gi)     Medications Prior to Admission  Medication Sig Dispense Refill  . amLODipine (NORVASC) 5 MG tablet Take 1 tablet by mouth daily.      . clonazePAM (KLONOPIN) 1 MG tablet Take 1 mg by mouth 3 (three) times daily as needed for anxiety.       . cyclobenzaprine (FLEXERIL) 10 MG tablet as needed.      . gabapentin (NEURONTIN) 300 MG capsule Take 300 mg by mouth 2 (two) times daily as needed (for nerve pain.).      Marland Kitchen HYDROcodone-acetaminophen (VICODIN) 5-500 MG per tablet Take 1 tablet by mouth every 6 (six) hours as needed. For pain      . lamoTRIgine (LAMICTAL) 25 MG tablet Take 50 mg by mouth at bedtime.       Marland Kitchen lisinopril (PRINIVIL,ZESTRIL) 20 MG tablet Take 20 mg by mouth every morning.       Marland Kitchen  losartan (COZAAR) 100 MG tablet Take 100 mg by mouth every morning.       . magnesium oxide (MAG-OX) 400 MG tablet Take 400 mg by mouth daily.      . Multiple Vitamins-Minerals (ZINC PO) Take 1 tablet by mouth daily.      Marland Kitchen omeprazole (PRILOSEC) 20 MG capsule Take 20 mg by mouth every morning.      Marland Kitchen OVER THE COUNTER MEDICATION Take 1 capsule by mouth 2 (two) times daily. Chloraphyll capsule.      . vitamin C (ASCORBIC ACID) 500 MG tablet Take 500 mg by mouth daily.      Marland Kitchen amoxicillin (AMOXIL) 500 MG tablet Take 4 tablets one hour prior to dental appointment  8 tablet  2  . buPROPion (WELLBUTRIN XL) 300 MG 24 hr tablet Take 300 mg by mouth every morning.       Marland Kitchen FIBER PO Take 1 capsule by mouth daily.      . fluconazole (DIFLUCAN) 150 MG tablet       . meloxicam (MOBIC) 15 MG tablet Take 1 tablet by mouth daily.       . polyethylene glycol powder (GLYCOLAX/MIRALAX) powder         No results found for this or any previous visit (from the past 48 hour(s)). No results found.  Review of Systems  Constitutional: Negative.   HENT: Negative.   Eyes: Negative.   Respiratory: Negative.   Cardiovascular: Negative.   Gastrointestinal: Negative.   Genitourinary: Negative.   Musculoskeletal: Positive for falls and joint pain.  Skin: Negative.   Neurological: Negative.   Endo/Heme/Allergies: Negative.   Psychiatric/Behavioral: Negative.     Blood pressure 122/60, pulse 62, temperature 98.4 F (36.9 C), temperature source Oral, resp. rate 16, height 5\' 2"  (1.575 m), weight 63.05 kg (139 lb), SpO2 100.00%. Physical Exam  Constitutional: She is oriented to person, place, and time.  HENT:  Head: Normocephalic.  Eyes: EOM are normal.  Neck: Normal range of motion.  Cardiovascular: Normal rate, normal heart sounds and intact distal pulses.   Respiratory: Effort normal and breath sounds normal.  GI: Soft. Bowel sounds are normal.  Genitourinary:  Deferred  Musculoskeletal: She exhibits edema (Rigth knee) and tenderness.  Neurological: She is alert and oriented to person, place, and time.  Skin: Skin is warm and dry.  Psychiatric: Her behavior is normal.     Assessment/Plan Right patella fracture: ORIF vs patellectomy D/c home tomorrow Overnight Obs Follow instructions  STILWELL, BRYSON L 04/03/2014, 3:20 PM

## 2014-04-03 NOTE — H&P (View-Only) (Signed)
Pt instructed npo p mn x omeprazole, neurontin.  Ok to have clear liquids until 1000.  Then absolutely nothing by mouth, . including no candy, mint, gum, water, food, hookra.  Pt verbalized her understanding. To [facility] @ 1400.  Needs istat on arrival.  ekg in chart.  Pt aware that she will be spending the night @ [facility].

## 2014-04-03 NOTE — Anesthesia Preprocedure Evaluation (Addendum)
Anesthesia Evaluation  Patient identified by MRN, date of birth, ID band Patient awake    Reviewed: Allergy & Precautions, H&P , NPO status , Patient's Chart, lab work & pertinent test results  Airway Mallampati: II TM Distance: >3 FB Neck ROM: Full    Dental no notable dental hx.    Pulmonary neg pulmonary ROS, former smoker,  breath sounds clear to auscultation  Pulmonary exam normal       Cardiovascular hypertension, Pt. on medications Rhythm:Regular Rate:Normal     Neuro/Psych PSYCHIATRIC DISORDERS Anxiety Depression Bipolar Disorder negative neurological ROS     GI/Hepatic Neg liver ROS, GERD-  ,  Endo/Other  negative endocrine ROS  Renal/GU negative Renal ROS     Musculoskeletal  (+) Arthritis -,   Abdominal   Peds  Hematology  (+) anemia ,   Anesthesia Other Findings   Reproductive/Obstetrics negative OB ROS                          Anesthesia Physical Anesthesia Plan  ASA: III  Anesthesia Plan: General and Regional   Post-op Pain Management:    Induction: Intravenous  Airway Management Planned:   Additional Equipment:   Intra-op Plan:   Post-operative Plan: Extubation in OR  Informed Consent: I have reviewed the patients History and Physical, chart, labs and discussed the procedure including the risks, benefits and alternatives for the proposed anesthesia with the patient or authorized representative who has indicated his/her understanding and acceptance.   Dental advisory given  Plan Discussed with: CRNA  Anesthesia Plan Comments:         Anesthesia Quick Evaluation

## 2014-04-03 NOTE — Anesthesia Procedure Notes (Addendum)
Procedure Name: LMA Insertion Date/Time: 04/03/2014 4:38 PM Performed by: Bethena Roys T Pre-anesthesia Checklist: Patient identified, Emergency Drugs available, Suction available and Patient being monitored Patient Re-evaluated:Patient Re-evaluated prior to inductionOxygen Delivery Method: Circle System Utilized Preoxygenation: Pre-oxygenation with 100% oxygen Intubation Type: IV induction Ventilation: Mask ventilation without difficulty LMA: LMA inserted LMA Size: 4.0 Number of attempts: 1 Airway Equipment and Method: bite block Placement Confirmation: positive ETCO2 Dental Injury: Teeth and Oropharynx as per pre-operative assessment    Anesthesia Regional Block:  Femoral nerve block  Pre-Anesthetic Checklist: ,, timeout performed, Correct Patient, Correct Site, Correct Laterality, Correct Procedure, Correct Position, site marked, Risks and benefits discussed, at surgeon's request and post-op pain management  Laterality: Right  Prep: Betadine       Needles:  Injection technique: Single-shot  Needle Type: Stimulator Needle - 80      Needle Gauge: 22 and 22 G  Needle insertion depth: 6 cm   Additional Needles:  Procedures: nerve stimulator Femoral nerve block  Nerve Stimulator or Paresthesia:  Response: Twitch elicited, 0.6 mA,   Additional Responses:   Narrative:   Performed by: Personally  Anesthesiologist: Franck Vinal, MD  Additional Notes: BP cuff, EKG monitors applied. Sedation begun. Femoral artery palpated for location of nerve. After nerve location verified with direct ultrasound guidance anesthetic injected incrementally, slowly, and after neg aspirations showing good anesthetic spread around the nerve. No intraneural injection. Tolerated well.

## 2014-04-03 NOTE — Op Note (Signed)
[phone]

## 2014-04-04 DIAGNOSIS — S82041A Displaced comminuted fracture of right patella, initial encounter for closed fracture: Secondary | ICD-10-CM | POA: Diagnosis not present

## 2014-04-04 LAB — BASIC METABOLIC PANEL
ANION GAP: 13 (ref 5–15)
BUN: 7 mg/dL (ref 6–23)
CO2: 24 mEq/L (ref 19–32)
CREATININE: 0.6 mg/dL (ref 0.50–1.10)
Calcium: 9.3 mg/dL (ref 8.4–10.5)
Chloride: 102 mEq/L (ref 96–112)
GFR calc Af Amer: >90 mL/min (ref >90)
GFR calc non Af Amer: >90 mL/min (ref >90)
GLUCOSE: 153 mg/dL — AB (ref 70–99)
Potassium: 4.5 mEq/L (ref 3.7–5.3)
Sodium: 139 mEq/L (ref 137–147)

## 2014-04-04 LAB — CBC
HCT: 27.1 % — ABNORMAL LOW (ref 36.0–46.0)
Hemoglobin: 9 g/dL — ABNORMAL LOW (ref 12.0–15.0)
MCH: 29.3 pg (ref 26.0–34.0)
MCHC: 33.2 g/dL (ref 30.0–36.0)
MCV: 88.3 fL (ref 78.0–100.0)
PLATELETS: 236 10*3/uL (ref 150–400)
RBC: 3.07 MIL/uL — AB (ref 3.87–5.11)
RDW: 13.9 % (ref 11.5–15.5)
WBC: 8.7 10*3/uL (ref 4.0–10.5)

## 2014-04-04 NOTE — Progress Notes (Signed)
   Subjective: 1 Day Post-Op Procedure(s) (LRB): PATELLECTOMY (Right)  Mild pain in the knee but pt c/o abd pain all night Minimally improved this morning but not ready for d/c currently due to pain and abd area Patient reports pain as moderate.  Objective:   VITALS:   Filed Vitals:   04/04/14 0559  BP: 112/55  Pulse: 78  Temp: 98.6 F (37 C)  Resp: 18    Right lower extremity with knee immobilizer intact nv intact distally abd non tender non distended with good rom moving in bed  LABS  Recent Labs  04/03/14 1550  HGB 10.5*  HCT 31.0*     Recent Labs  04/03/14 1550  NA 137  K 4.0  GLUCOSE 82     Assessment/Plan: 1 Day Post-Op Procedure(s) (LRB): PATELLECTOMY (Right) Possible d/c later today if pain improves Call me for discharge order if pt has improved and ready for home Continue with pain control     Brad Dixon, MPAS, PA-C  04/04/2014, 7:57 AM

## 2014-04-04 NOTE — Progress Notes (Addendum)
CARE MANAGEMENT NOTE 04/04/2014  Patient:  Nichole Gibbs, Nichole Gibbs   Account Number:  [id number]  Date Initiated:  04/04/2014  Documentation initiated by:  [facility]  Subjective/Objective Assessment:   PATELLECTOMY (Right)     Action/Plan:   Anticipated DC Date:  04/04/2014   Anticipated DC Plan:  [place]  CM consult      Choice offered to / List presented to:     DME arranged  [place]      DME agency  [place].        Status of service:  Completed, signed off Medicare Important Message given?   (If response is "NO", the following Medicare IM given date fields will be blank) Date Medicare IM given:   Medicare IM given by:   Date Additional Medicare IM given:   Additional Medicare IM given by:    Discharge Disposition:  HOME/SELF CARE  Per UR Regulation:    If discussed at Long Length of Stay Meetings, dates discussed:    Comments:  04/04/2014 12:18 PM  NCM spoke to pt and she is requesting wheelchair and tub bench for home. NCM notified [place] for DME for home for information on when pt should receive DME. States [facility] will deliver to the home. NCM made pt aware DME will be delivered to the home. Jonnie Finner  RN CCM Case Mgmt phone [phone]  04/04/2014 1028 NCM spoke to [facility] DME rep, Jermaine. States pt requesting wheelchair for home. NCM contacted pt and states he had order from her physician for a wheelchair. States has been waiting on wheelchair. Contacted [facility] to make aware orders in [facility]IC for DME and pt also requesting tub bench for home. Jonnie Finner RN CCM Case Mgmt phone [phone]

## 2014-04-04 NOTE — Op Note (Signed)
[name], [name]          ACCOUNT NO.:  [id number]  MEDICAL RECORD NO.:  [id number]  LOCATION:  5397                         FACILITY:  [facility]  PHYSICIAN:  Cynda Familia, M.D.DATE OF BIRTH:  Nov 24, 1955  DATE OF PROCEDURE: DATE OF DISCHARGE:                              OPERATIVE REPORT   PREOPERATIVE DIAGNOSIS:  Right knee comminuted, displaced patella fracture.  POSTOPERATIVE DIAGNOSES:  Right knee comminuted, displaced patella fracture, osteopenia.  PROCEDURES:  Right knee patellectomy and reconstruction of extensor mechanism.  SURGEON:  Cynda Familia, M.D.  ASSISTANT:  Wyatt Portela, PA-C.  ANESTHESIA:  Femoral nerve block, general.  ESTIMATED BLOOD LOSS:  Minimal.  DRAINS:  None.  COMPLICATIONS:  None.  TOURNIQUET TIME:  50 minutes at 300 mmHg.  DISPOSITION:  PACU, stable.  OPERATIVE DETAILS:  The patient was counseled in the holding area. Correct site was identified, marked, and signed appropriately.  IV was started, sedation given.  Block was administered by the anesthesiologist.  Taken to the operating room, placed in supine position under general anesthesia.  All extremities were well padded and bumped.  Right lower extremity was elevated, prepped with DuraPrep and draped in a sterile fashion.  Time-out was done, confirmed the right side, exsanguinated with Esmarch.  Tourniquet was deflated at 300 mmHg. Straight midline incision was made at the skin and subcutaneous tissues. Extensor mechanism was opened.  The retinaculum was ruptured.  The fracture was identified.  The distal fragment was marked, markedly comminuted, approximately 20% at the articular surface with marked osteopenia and comminution and irreducible.  The proximal fragment was also found to be markedly much more comminuted, anticipated by the plain x-rays with osteopenia and marked incongruency.  At this point in time, it was felt this was irreparable due to the fact  of amount of comminution, softness to her bone, and etc.  With that in mind, patellectomy was performed.  The femoral trochlea showed mild softening. The knee was irrigated again in a pursestring fashion, #2 Arthrex FiberWire suture was placed circumferentially, pulled in the quadriceps down to the patellar tendon, infrapatellar ligament circumferentially. The reinforcement with medial and lateral interrupted figure-of-eight #2 FiberWire suture.  This gave a nice, tight closure.  The knee was in extension and the quadriceps mechanism was properly tensioned.  We have pulled the quadriceps distal as much as possible prior to inflation of the tourniquet.  This was extended in medial and lateral capsule regions.  Knee was again irrigated.  The retinacular layer was closed with subcu Vicryl, skin with a subcuticular Monocryl suture.  Dermabond was applied with an Aquacel dressing.  Compressive wraps were applied and Ace wraps.  She was then placed in a well-molded knee immobilizer in full extension.  Tourniquet was deflated after application of the dressing.  There were no complications or problems.  Tolerated the procedure well, and she will be kept overnight.  To help with the patient's positioning, prepping and draping, technical and surgical assistance throughout the entire case, Mr. Wyatt Portela, PA-C's assistance was needed.          ______________________________ Cynda Familia, M.D.     RAC/MEDQ  D:  04/03/2014  T:  04/04/2014  Job:  [id number]

## 2014-04-04 NOTE — Progress Notes (Signed)
UR completed 

## 2014-04-04 NOTE — Discharge Summary (Signed)
Physician Discharge Summary   Patient ID: Nichole Gibbs MRN: [id number] DOB/AGE: [birth year] 56 y.o.  Admit date: 04/03/2014 Discharge date: 04/04/2014  Admission Diagnoses:  Active Problems:   S/P knee surgery   Discharge Diagnoses:  Same   Surgeries: Procedure(s): PATELLECTOMY on 04/03/2014   Consultants: none  Discharged Condition: Stable  Hospital Course: Nichole Gibbs is an 58 y.o. female who was admitted 04/03/2014 with a chief complaint of No chief complaint on file. , and found to have a diagnosis of <principal problem not specified>.  They were brought to the operating room on 04/03/2014 and underwent the above named procedures.    The patient had an uncomplicated hospital course and was stable for discharge.  Recent vital signs:  Filed Vitals:   04/04/14 0559  BP: 112/55  Pulse: 78  Temp: 98.6 F (37 C)  Resp: 18    Recent laboratory studies:  Results for orders placed during the hospital encounter of 04/03/14  CBC      Result Value Ref Range   WBC 8.7  4.0 - 10.5 K/uL   RBC 3.07 (*) 3.87 - 5.11 MIL/uL   Hemoglobin 9.0 (*) 12.0 - 15.0 g/dL   HCT 27.1 (*) 36.0 - 46.0 %   MCV 88.3  78.0 - 100.0 fL   MCH 29.3  26.0 - 34.0 pg   MCHC 33.2  30.0 - 36.0 g/dL   RDW 13.9  11.5 - 15.5 %   Platelets 236  150 - 400 K/uL  BASIC METABOLIC PANEL      Result Value Ref Range   Sodium 139  137 - 147 mEq/L   Potassium 4.5  3.7 - 5.3 mEq/L   Chloride 102  96 - 112 mEq/L   CO2 24  19 - 32 mEq/L   Glucose, Bld 153 (*) 70 - 99 mg/dL   BUN 7  6 - 23 mg/dL   Creatinine, Ser 0.60  0.50 - 1.10 mg/dL   Calcium 9.3  8.4 - 10.5 mg/dL   GFR calc non Af Amer >90  >90 mL/min   GFR calc Af Amer >90  >90 mL/min   Anion gap 13  5 - 15  POCT I-STAT 4, (NA,K, GLUC, HGB,HCT)      Result Value Ref Range   Sodium 137  137 - 147 mEq/L   Potassium 4.0  3.7 - 5.3 mEq/L   Glucose, Bld 82  70 - 99 mg/dL   HCT 31.0 (*) 36.0 - 46.0 %   Hemoglobin 10.5 (*) 12.0 - 15.0 g/dL     Discharge Medications:     Medication List    STOP taking these medications       HYDROcodone-acetaminophen 5-500 MG per tablet  Commonly known as:  VICODIN     lisinopril 20 MG tablet  Commonly known as:  PRINIVIL,ZESTRIL      TAKE these medications       amLODipine 5 MG tablet  Commonly known as:  NORVASC  Take 1 tablet by mouth daily.     amoxicillin 500 MG tablet  Commonly known as:  AMOXIL  Take 4 tablets one hour prior to dental appointment     aspirin 325 MG EC tablet  Take 1 tablet (325 mg total) by mouth 2 (two) times daily.     aspirin EC 325 MG tablet  Take 1 tablet (325 mg total) by mouth 2 (two) times daily.     CHLOROPHYLL PO  Take 1 tablet by mouth daily.  clonazePAM 1 MG tablet  Commonly known as:  KLONOPIN  Take 1 mg by mouth 3 (three) times daily as needed for anxiety.     COPPER CAPS PO  Take 1 capsule by mouth daily.     cyclobenzaprine 10 MG tablet  Commonly known as:  FLEXERIL  as needed.     cyclobenzaprine 10 MG tablet  Commonly known as:  FLEXERIL  Take 1 tablet (10 mg total) by mouth 3 (three) times daily as needed for muscle spasms.     FIBER PO  Take 1 capsule by mouth daily.     gabapentin 300 MG capsule  Commonly known as:  NEURONTIN  Take 300 mg by mouth 2 (two) times daily as needed (for nerve pain.).     lamoTRIgine 25 MG tablet  Commonly known as:  LAMICTAL  Take 50 mg by mouth at bedtime.     losartan 100 MG tablet  Commonly known as:  COZAAR  Take 100 mg by mouth every morning.     magnesium oxide 400 MG tablet  Commonly known as:  MAG-OX  Take 400 mg by mouth daily.     meloxicam 15 MG tablet  Commonly known as:  MOBIC  Take 1 tablet by mouth daily.     omeprazole 20 MG capsule  Commonly known as:  PRILOSEC  Take 20 mg by mouth every morning.     oxyCODONE-acetaminophen 5-325 MG per tablet  Commonly known as:  ROXICET  Take 1-2 tablets by mouth every 4 (four) hours as needed.     ranitidine 300  MG tablet  Commonly known as:  ZANTAC  Take 300 mg by mouth 2 (two) times daily.     vitamin C 500 MG tablet  Commonly known as:  ASCORBIC ACID  Take 500 mg by mouth daily.     zinc gluconate 50 MG tablet  Take 50 mg by mouth 2 (two) times daily.     ZINC PO  Take 1 tablet by mouth daily.        Diagnostic Studies: Dg Knee Complete 4 Views Right  03/27/2014   CLINICAL DATA:  Fall onto right knee this morning with twisting. Pain and swelling.  EXAM: RIGHT KNEE - COMPLETE 4+ VIEW  COMPARISON:  None.  FINDINGS: There is a horizontal fracture through the mid patella. There is additional vertical fracture through the superior aspect of the patella. There is distraction of the superior fracture fragment from the inferior fraction fragment by approximately 1.8 cm. There is a suprapatellar joint effusion. There is pre patellar soft tissue swelling.  No evidence of tibial fracture  IMPRESSION: Complex distracted fracture of the patella.   Electronically Signed   By: Suzy Bouchard M.D.   On: 03/27/2014 11:09    Disposition: 01-Home or Self Care      Discharge Instructions   Call MD / Call 911    Complete by:  As directed   If you experience chest pain or shortness of breath, CALL 911 and be transported to the hospital emergency room.  If you develope a fever above 101 F, pus (white drainage) or increased drainage or redness at the wound, or calf pain, call your surgeon's office.     Call MD / Call 911    Complete by:  As directed   If you experience chest pain or shortness of breath, CALL 911 and be transported to the hospital emergency room.  If you develope a fever above 101 F, pus (white  drainage) or increased drainage or redness at the wound, or calf pain, call your surgeon's office.     Constipation Prevention    Complete by:  As directed   Drink plenty of fluids.  Prune juice may be helpful.  You may use a stool softener, such as Colace (over the counter) 100 mg twice a day.  Use  MiraLax (over the counter) for constipation as needed.     Constipation Prevention    Complete by:  As directed   Drink plenty of fluids.  Prune juice may be helpful.  You may use a stool softener, such as Colace (over the counter) 100 mg twice a day.  Use MiraLax (over the counter) for constipation as needed.     Diet - low sodium heart healthy    Complete by:  As directed      Diet - low sodium heart healthy    Complete by:  As directed      Discharge instructions    Complete by:  As directed   Call and make an appointment for 2 weeks from now ([phone]). Leave dressing in place. May shower. Leave brace in place. Elevate as tolerated. Follow D/C instructions. Take medications as directed. Partial Weight bear as tolerated with assistive devices.     Increase activity slowly as tolerated    Complete by:  As directed      Increase activity slowly as tolerated    Complete by:  As directed               Signed: Jaiyana Canale B 04/04/2014, 10:43 AM

## 2014-04-04 NOTE — Progress Notes (Signed)
Patient discharged home, all discharge medications and instructions reviewed and questions answered.  Patient provided with prescriptions and encouraged to call for wheelchair assistance to vehicle when ride arrives.

## 2014-04-05 NOTE — Progress Notes (Signed)
04/05/2014 1040 NCM contacted [facility] to confirm delivery of DME. Jonnie Finner RN CCM Case Mgmt phone [phone]

## 2014-04-06 ENCOUNTER — Encounter (HOSPITAL_BASED_OUTPATIENT_CLINIC_OR_DEPARTMENT_OTHER): Payer: Self-pay | Admitting: Specialist

## 2014-04-21 DIAGNOSIS — M545 Low back pain: Secondary | ICD-10-CM | POA: Diagnosis not present

## 2014-04-22 DIAGNOSIS — I10 Essential (primary) hypertension: Secondary | ICD-10-CM | POA: Diagnosis not present

## 2014-04-22 DIAGNOSIS — M2550 Pain in unspecified joint: Secondary | ICD-10-CM | POA: Diagnosis not present

## 2014-04-23 DIAGNOSIS — F319 Bipolar disorder, unspecified: Secondary | ICD-10-CM | POA: Diagnosis not present

## 2014-04-29 DIAGNOSIS — M545 Low back pain: Secondary | ICD-10-CM | POA: Diagnosis not present

## 2014-05-08 DIAGNOSIS — M545 Low back pain: Secondary | ICD-10-CM | POA: Diagnosis not present

## 2014-05-27 ENCOUNTER — Encounter (HOSPITAL_BASED_OUTPATIENT_CLINIC_OR_DEPARTMENT_OTHER): Payer: Self-pay | Admitting: Specialist

## 2014-06-04 DIAGNOSIS — S82031G Displaced transverse fracture of right patella, subsequent encounter for closed fracture with delayed healing: Secondary | ICD-10-CM | POA: Diagnosis not present

## 2014-06-10 DIAGNOSIS — S82031G Displaced transverse fracture of right patella, subsequent encounter for closed fracture with delayed healing: Secondary | ICD-10-CM | POA: Diagnosis not present

## 2014-06-12 ENCOUNTER — Encounter (HOSPITAL_COMMUNITY): Payer: Self-pay | Admitting: *Deleted

## 2014-06-12 ENCOUNTER — Emergency Department (HOSPITAL_COMMUNITY): Payer: Medicare Other

## 2014-06-12 ENCOUNTER — Emergency Department (HOSPITAL_COMMUNITY)
Admission: EM | Admit: 2014-06-12 | Discharge: 2014-06-12 | Disposition: A | Discharge status: Home / Self Care | Payer: Medicare Other | Attending: Emergency Medicine | Admitting: Emergency Medicine

## 2014-06-12 DIAGNOSIS — Z7982 Long term (current) use of aspirin: Secondary | ICD-10-CM | POA: Diagnosis not present

## 2014-06-12 DIAGNOSIS — D649 Anemia, unspecified: Secondary | ICD-10-CM | POA: Diagnosis not present

## 2014-06-12 DIAGNOSIS — F319 Bipolar disorder, unspecified: Secondary | ICD-10-CM | POA: Insufficient documentation

## 2014-06-12 DIAGNOSIS — R072 Precordial pain: Secondary | ICD-10-CM | POA: Diagnosis not present

## 2014-06-12 DIAGNOSIS — Z791 Long term (current) use of non-steroidal anti-inflammatories (NSAID): Secondary | ICD-10-CM | POA: Insufficient documentation

## 2014-06-12 DIAGNOSIS — Z79899 Other long term (current) drug therapy: Secondary | ICD-10-CM | POA: Diagnosis not present

## 2014-06-12 DIAGNOSIS — K219 Gastro-esophageal reflux disease without esophagitis: Secondary | ICD-10-CM | POA: Diagnosis not present

## 2014-06-12 DIAGNOSIS — Z87891 Personal history of nicotine dependence: Secondary | ICD-10-CM | POA: Insufficient documentation

## 2014-06-12 DIAGNOSIS — G8928 Other chronic postprocedural pain: Secondary | ICD-10-CM | POA: Diagnosis not present

## 2014-06-12 DIAGNOSIS — I10 Essential (primary) hypertension: Secondary | ICD-10-CM | POA: Insufficient documentation

## 2014-06-12 DIAGNOSIS — Z9889 Other specified postprocedural states: Secondary | ICD-10-CM | POA: Diagnosis not present

## 2014-06-12 DIAGNOSIS — M1990 Unspecified osteoarthritis, unspecified site: Secondary | ICD-10-CM | POA: Insufficient documentation

## 2014-06-12 DIAGNOSIS — Z8781 Personal history of (healed) traumatic fracture: Secondary | ICD-10-CM | POA: Diagnosis not present

## 2014-06-12 DIAGNOSIS — R42 Dizziness and giddiness: Secondary | ICD-10-CM | POA: Diagnosis not present

## 2014-06-12 DIAGNOSIS — R079 Chest pain, unspecified: Secondary | ICD-10-CM

## 2014-06-12 DIAGNOSIS — S8991XA Unspecified injury of right lower leg, initial encounter: Secondary | ICD-10-CM | POA: Diagnosis not present

## 2014-06-12 DIAGNOSIS — M25561 Pain in right knee: Secondary | ICD-10-CM | POA: Diagnosis not present

## 2014-06-12 DIAGNOSIS — F419 Anxiety disorder, unspecified: Secondary | ICD-10-CM | POA: Insufficient documentation

## 2014-06-12 DIAGNOSIS — R0602 Shortness of breath: Secondary | ICD-10-CM | POA: Diagnosis not present

## 2014-06-12 DIAGNOSIS — M25569 Pain in unspecified knee: Secondary | ICD-10-CM

## 2014-06-12 LAB — CBC
HCT: 34.9 % — ABNORMAL LOW (ref 36.0–46.0)
HEMOGLOBIN: 11.3 g/dL — AB (ref 12.0–15.0)
MCH: 27.9 pg (ref 26.0–34.0)
MCHC: 32.4 g/dL (ref 30.0–36.0)
MCV: 86.2 fL (ref 78.0–100.0)
Platelets: 203 10*3/uL (ref 150–400)
RBC: 4.05 MIL/uL (ref 3.87–5.11)
RDW: 14.5 % (ref 11.5–15.5)
WBC: 3.3 10*3/uL — ABNORMAL LOW (ref 4.0–10.5)

## 2014-06-12 LAB — I-STAT TROPONIN, ED: Troponin i, poc: 0 ng/mL (ref 0.00–0.08)

## 2014-06-12 LAB — BASIC METABOLIC PANEL
Anion gap: 14 (ref 5–15)
BUN: 9 mg/dL (ref 6–23)
CALCIUM: 9.9 mg/dL (ref 8.4–10.5)
CO2: 25 meq/L (ref 19–32)
Chloride: 102 mEq/L (ref 96–112)
Creatinine, Ser: 0.61 mg/dL (ref 0.50–1.10)
GFR calc Af Amer: >90 mL/min (ref >90)
GLUCOSE: 74 mg/dL (ref 70–99)
Potassium: 4.3 mEq/L (ref 3.7–5.3)
Sodium: 141 mEq/L (ref 137–147)

## 2014-06-12 MED ORDER — OXYCODONE HCL 5 MG PO TABS
10.0000 mg | ORAL_TABLET | Freq: Once | ORAL | Status: AC
  Administered 2014-06-12: 10 mg via ORAL
  Filled 2014-06-12: qty 2

## 2014-06-12 NOTE — ED Provider Notes (Signed)
CSN: [id number]     Arrival date & time 06/12/14  1028 History   First MD Initiated Contact with Patient 06/12/14 1114     Chief Complaint  Patient presents with  . Chest Pain  . Dizziness  . Knee Pain     HPI Patient reports that she has had ongoing right knee pain since her patella surgery on October 15.  She has a history of chronic pain for which she takes hydrocodone.  She states this is not been helping her pain which seems to be worsening in her right knee over the past week.  No fevers or chills.  The redness or warmth.  Denies significant pain with range of motion.  She states her range of motion is still limited since the surgery but she has been working with physical therapy.  She denies swelling of the right lower extremity.  She denies numbness or tingling or weakness of the right lower extremity as well.  She's concerned about the increasing pain in the right knee.  She called her orthopedic surgeon but her appointment is not until next week.  She's afraid that she could not wait until then and thus she came to the ER for evaluation.  Ambulatory on arrival just complains of transient chest aching last night that since has resolved.  She has no active chest pain this time.  No history of cardiac disease.    Past Medical History  Diagnosis Date  . Hypertension   . Mitral valve prolapse     cardiologist-   dr Burt Knack  . Anxiety   . GERD (gastroesophageal reflux disease)   . Bipolar 1 disorder   . Osteoarthritis   . Rotator cuff syndrome     right side  . Chest pain     denies at this time 10/7  . Anemia   . Depression   . Right patella fracture   . Moderate mitral regurgitation   . Memory loss     pt states mild memory loss   Past Surgical History  Procedure Laterality Date  . Cesarean section    . Tonsillectomy  09-07-2003  . Cauterization post tonsillectomy  09-25-2003  . Anterior cervical decomp/discectomy fusion  05-19-2009    C4 --  C7  and C5 corpectomy  .  Re-exploration anterior cervical wound and evacuation hematoma  05-19-2009  . Cardiovascular stress test  11-20-2012  dr cooper    normal perfusion study/  no ischemia/  ef 62%  . Transthoracic echocardiogram  01-07-2014    grade II diastolic dysfunction/  ef 55-60%/  mild late systolic mitral valve prolapse of anterior leaflet/  moderate MR  . Patellectomy Right 04/03/2014    Procedure: PATELLECTOMY;  Surgeon: Sydnee Cabal, MD;  Location: [facility];  Service: Orthopedics;  Laterality: Right;   Family History  Problem Relation Age of Onset  . Heart disease Mother   . Heart attack Father   . Colon polyps Father   . Skin cancer Father   . Esophageal cancer Maternal Uncle   . Other Maternal Aunt     stomcah polyps  . Bone cancer Paternal Grandfather   . Uterine cancer Paternal Grandmother   . Liver cancer Paternal Uncle   . Cirrhosis Paternal Uncle    History  Substance Use Topics  . Smoking status: Former Smoker -- 1.00 packs/day for 30 years    Types: Cigarettes  . Smokeless tobacco: Never Used     Comment: uses hooka 1 ounce/2  weeks  . Alcohol Use: 0.6 oz/week    1 Cans of beer per week     Comment: occasional beer   OB History    No data available     Review of Systems  All other systems reviewed and are negative.     Allergies  Ibuprofen and Lactose intolerance (gi)  Home Medications   Prior to Admission medications   Medication Sig Start Date End Date Taking? Authorizing Provider  amLODipine (NORVASC) 5 MG tablet Take 1 tablet by mouth daily. 10/01/12   Historical Provider, MD  amoxicillin (AMOXIL) 500 MG tablet Take 4 tablets one hour prior to dental appointment 12/23/13   Blane Ohara, MD  aspirin EC 325 MG EC tablet Take 1 tablet (325 mg total) by mouth 2 (two) times daily. 04/03/14   Bryson L Stilwell, PA-C  aspirin EC 325 MG tablet Take 1 tablet (325 mg total) by mouth 2 (two) times daily. 04/03/14   Bryson L Stilwell, PA-C  CHLOROPHYLL PO  Take 1 tablet by mouth daily.    Historical Provider, MD  clonazePAM (KLONOPIN) 1 MG tablet Take 1 mg by mouth 3 (three) times daily as needed for anxiety.     Historical Provider, MD  Copper Gluconate (COPPER CAPS PO) Take 1 capsule by mouth daily.    Historical Provider, MD  cyclobenzaprine (FLEXERIL) 10 MG tablet as needed. 10/16/12   Historical Provider, MD  cyclobenzaprine (FLEXERIL) 10 MG tablet Take 1 tablet (10 mg total) by mouth 3 (three) times daily as needed for muscle spasms. 04/03/14   Bryson L Stilwell, PA-C  FIBER PO Take 1 capsule by mouth daily.    Historical Provider, MD  gabapentin (NEURONTIN) 300 MG capsule Take 300 mg by mouth 2 (two) times daily as needed (for nerve pain.).    Historical Provider, MD  lamoTRIgine (LAMICTAL) 25 MG tablet Take 50 mg by mouth at bedtime.     Historical Provider, MD  losartan (COZAAR) 100 MG tablet Take 100 mg by mouth every morning.     Historical Provider, MD  magnesium oxide (MAG-OX) 400 MG tablet Take 400 mg by mouth daily.    Historical Provider, MD  meloxicam (MOBIC) 15 MG tablet Take 1 tablet by mouth daily. 10/16/12   Historical Provider, MD  Multiple Vitamins-Minerals (ZINC PO) Take 1 tablet by mouth daily.    Historical Provider, MD  omeprazole (PRILOSEC) 20 MG capsule Take 20 mg by mouth every morning.    Historical Provider, MD  oxyCODONE-acetaminophen (ROXICET) 5-325 MG per tablet Take 1-2 tablets by mouth every 4 (four) hours as needed. 04/03/14   Bryson L Stilwell, PA-C  ranitidine (ZANTAC) 300 MG tablet Take 300 mg by mouth 2 (two) times daily.    Historical Provider, MD  vitamin C (ASCORBIC ACID) 500 MG tablet Take 500 mg by mouth daily.    Historical Provider, MD  zinc gluconate 50 MG tablet Take 50 mg by mouth 2 (two) times daily.    Historical Provider, MD   BP 109/58 mmHg  Pulse 70  Temp(Src) 97.7 F (36.5 C) (Oral)  Resp 11  Ht 5\' 2"  (1.575 m)  Wt 135 lb (61.236 kg)  BMI 24.69 kg/m2  SpO2 98% Physical Exam   Constitutional: She is oriented to person, place, and time. She appears well-developed and well-nourished. No distress.  HENT:  Head: Normocephalic and atraumatic.  Eyes: EOM are normal.  Neck: Normal range of motion.  Cardiovascular: Normal rate, regular rhythm and normal heart sounds.  Pulmonary/Chest: Effort normal and breath sounds normal.  Abdominal: Soft. She exhibits no distension. There is no tenderness.  Musculoskeletal: Normal range of motion.  Normal PT and DP pulse in right foot.  Compartments of right lower leg are soft.  Able to arrange right knee without significant discomfort or pain.  Still limited range of motion of the right knee past about 70-80 which she states is baseline for her.  No warmth or obvious joint effusion noted.  Incision appears intact without secondary signs of infection.  Neurological: She is alert and oriented to person, place, and time.  Skin: Skin is warm and dry.  Psychiatric: She has a normal mood and affect. Judgment normal.  Nursing note and vitals reviewed.   ED Course  Procedures (including critical care time) Labs Review Labs Reviewed  CBC - Abnormal; Notable for the following:    WBC 3.3 (*)    Hemoglobin 11.3 (*)    HCT 34.9 (*)    All other components within normal limits  BASIC METABOLIC PANEL  I-STAT TROPOININ, ED    Imaging Review Dg Chest 2 View  06/12/2014   CLINICAL DATA:  Substernal mid chest pain for weeks. Shortness of breath, dizziness.  EXAM: CHEST  2 VIEW  COMPARISON:  09/11/2012  FINDINGS: The heart size and mediastinal contours are within normal limits. Both lungs are clear. The visualized skeletal structures are unremarkable.  IMPRESSION: No active cardiopulmonary disease.   Electronically Signed   By: Rolm Baptise M.D.   On: 06/12/2014 11:37   Dg Knee Complete 4 Views Right  06/12/2014   CLINICAL DATA:  Chronic right knee pain. Surgery 2 months prior. Fall 3-4 days ago.  EXAM: RIGHT KNEE - COMPLETE 4+ VIEW   COMPARISON:  03/27/2014.  FINDINGS: The patient is post patellar resection. Tibial femoral alignment is maintained. There is mild soft tissue edema about the knee. No large joint effusion. No radiopaque foreign body.  IMPRESSION: Post patellar resection. Mild soft tissue edema, no acute bony abnormality.   Electronically Signed   By: Jeb Levering M.D.   On: 06/12/2014 13:12     EKG Interpretation   Date/Time:  Friday June 12 2014 10:36:07 EST Ventricular Rate:  62 PR Interval:  148 QRS Duration: 82 QT Interval:  428 QTC Calculation: 434 R Axis:   68 Text Interpretation:  Normal sinus rhythm Possible Left atrial enlargement  Borderline ECG No significant change was found Confirmed by Kimo Bancroft  MD,  Lennette Bihari ([id number]) on 06/12/2014 2:53:10 PM      MDM   Final diagnoses:  Knee pain  Right knee pain  Chest pain, unspecified chest pain type    Ongoing right knee pain status post surgery.  Patient's pain has been persistent since her surgery.  No signs of infection this time.  Doubt septic arthritis.  No signs of cellulitis.  With the follow-up.  Anna care follow-up.  She'll continue her home pain medicines    Hoy Morn, MD 06/12/14 [phone]

## 2014-06-12 NOTE — ED Notes (Signed)
Patient denies dizziness or chest pain at this time.

## 2014-06-12 NOTE — ED Notes (Signed)
Patient transported to X-ray 

## 2014-06-12 NOTE — ED Notes (Signed)
Pt is here with complaints of right knee pains and is post knee cap surgery in 10/15.  Pt concerned for clot, pulse palpable.  Pt with chest pain and states that she woke up the other night gasping for air and reports dizziness

## 2014-06-12 NOTE — ED Notes (Signed)
Patient arrived in the room. MD Campos at the bedside.

## 2014-06-12 NOTE — ED Notes (Signed)
Patient returned from X-ray 

## 2014-06-15 ENCOUNTER — Other Ambulatory Visit: Payer: Self-pay | Admitting: *Deleted

## 2014-06-15 ENCOUNTER — Ambulatory Visit (HOSPITAL_COMMUNITY)
Admission: RE | Admit: 2014-06-15 | Discharge: 2014-06-15 | Disposition: A | Discharge status: Home / Self Care | Payer: Medicare Other | Source: Ambulatory Visit | Attending: Cardiology | Admitting: Cardiology

## 2014-06-15 DIAGNOSIS — R600 Localized edema: Secondary | ICD-10-CM | POA: Insufficient documentation

## 2014-06-15 DIAGNOSIS — M79662 Pain in left lower leg: Secondary | ICD-10-CM

## 2014-06-15 NOTE — Progress Notes (Signed)
Order placed from Dr Jenne Campus for a right lower extremity doppler due to calf pain and swelling.  R/o DVT

## 2014-06-16 ENCOUNTER — Telehealth (HOSPITAL_COMMUNITY): Payer: Self-pay | Admitting: *Deleted

## 2014-06-16 NOTE — Progress Notes (Signed)
Right Lower Ext. Venous Duplex Completed. Negative for DVT or SVT. Oda Cogan, BS, RDMS, RVT

## 2014-06-17 DIAGNOSIS — S82031G Displaced transverse fracture of right patella, subsequent encounter for closed fracture with delayed healing: Secondary | ICD-10-CM | POA: Diagnosis not present

## 2014-06-22 ENCOUNTER — Telehealth (HOSPITAL_COMMUNITY): Payer: Self-pay | Admitting: *Deleted

## 2014-06-22 DIAGNOSIS — S82031G Displaced transverse fracture of right patella, subsequent encounter for closed fracture with delayed healing: Secondary | ICD-10-CM | POA: Diagnosis not present

## 2014-06-23 DIAGNOSIS — F319 Bipolar disorder, unspecified: Secondary | ICD-10-CM | POA: Diagnosis not present

## 2014-07-07 DIAGNOSIS — S82031G Displaced transverse fracture of right patella, subsequent encounter for closed fracture with delayed healing: Secondary | ICD-10-CM | POA: Diagnosis not present

## 2014-07-10 DIAGNOSIS — S82031G Displaced transverse fracture of right patella, subsequent encounter for closed fracture with delayed healing: Secondary | ICD-10-CM | POA: Diagnosis not present

## 2014-07-15 ENCOUNTER — Other Ambulatory Visit: Payer: Self-pay | Admitting: Specialist

## 2014-07-15 DIAGNOSIS — S82031G Displaced transverse fracture of right patella, subsequent encounter for closed fracture with delayed healing: Secondary | ICD-10-CM | POA: Diagnosis not present

## 2014-07-15 DIAGNOSIS — F319 Bipolar disorder, unspecified: Secondary | ICD-10-CM | POA: Diagnosis not present

## 2014-07-23 ENCOUNTER — Ambulatory Visit
Admission: RE | Admit: 2014-07-23 | Discharge: 2014-07-23 | Disposition: A | Discharge status: Home / Self Care | Payer: Medicare Other | Source: Ambulatory Visit | Attending: Specialist | Admitting: Specialist

## 2014-07-23 DIAGNOSIS — S82031G Displaced transverse fracture of right patella, subsequent encounter for closed fracture with delayed healing: Secondary | ICD-10-CM

## 2014-07-23 DIAGNOSIS — M25561 Pain in right knee: Secondary | ICD-10-CM | POA: Diagnosis not present

## 2014-07-23 DIAGNOSIS — R600 Localized edema: Secondary | ICD-10-CM | POA: Diagnosis not present

## 2014-07-29 DIAGNOSIS — D510 Vitamin B12 deficiency anemia due to intrinsic factor deficiency: Secondary | ICD-10-CM | POA: Diagnosis not present

## 2014-07-29 DIAGNOSIS — M79661 Pain in right lower leg: Secondary | ICD-10-CM | POA: Diagnosis not present

## 2014-07-29 DIAGNOSIS — I10 Essential (primary) hypertension: Secondary | ICD-10-CM | POA: Diagnosis not present

## 2014-07-29 DIAGNOSIS — K219 Gastro-esophageal reflux disease without esophagitis: Secondary | ICD-10-CM | POA: Diagnosis not present

## 2014-07-31 DIAGNOSIS — S82031G Displaced transverse fracture of right patella, subsequent encounter for closed fracture with delayed healing: Secondary | ICD-10-CM | POA: Diagnosis not present

## 2014-08-05 DIAGNOSIS — G5771 Causalgia of right lower limb: Secondary | ICD-10-CM | POA: Diagnosis not present

## 2014-08-05 DIAGNOSIS — S82031G Displaced transverse fracture of right patella, subsequent encounter for closed fracture with delayed healing: Secondary | ICD-10-CM | POA: Diagnosis not present

## 2014-08-05 DIAGNOSIS — Z9889 Other specified postprocedural states: Secondary | ICD-10-CM | POA: Diagnosis not present

## 2014-08-13 DIAGNOSIS — S82031G Displaced transverse fracture of right patella, subsequent encounter for closed fracture with delayed healing: Secondary | ICD-10-CM | POA: Diagnosis not present

## 2014-08-14 DIAGNOSIS — S82031G Displaced transverse fracture of right patella, subsequent encounter for closed fracture with delayed healing: Secondary | ICD-10-CM | POA: Diagnosis not present

## 2014-08-18 DIAGNOSIS — S82031G Displaced transverse fracture of right patella, subsequent encounter for closed fracture with delayed healing: Secondary | ICD-10-CM | POA: Diagnosis not present

## 2014-08-20 DIAGNOSIS — S82031G Displaced transverse fracture of right patella, subsequent encounter for closed fracture with delayed healing: Secondary | ICD-10-CM | POA: Diagnosis not present

## 2014-08-21 DIAGNOSIS — F319 Bipolar disorder, unspecified: Secondary | ICD-10-CM | POA: Diagnosis not present

## 2014-08-25 DIAGNOSIS — S82031G Displaced transverse fracture of right patella, subsequent encounter for closed fracture with delayed healing: Secondary | ICD-10-CM | POA: Diagnosis not present

## 2014-09-03 DIAGNOSIS — F319 Bipolar disorder, unspecified: Secondary | ICD-10-CM | POA: Diagnosis not present

## 2014-09-07 DIAGNOSIS — S82031D Displaced transverse fracture of right patella, subsequent encounter for closed fracture with routine healing: Secondary | ICD-10-CM | POA: Diagnosis not present

## 2014-09-07 DIAGNOSIS — G5771 Causalgia of right lower limb: Secondary | ICD-10-CM | POA: Diagnosis not present

## 2014-09-15 DIAGNOSIS — H4011X3 Primary open-angle glaucoma, severe stage: Secondary | ICD-10-CM | POA: Diagnosis not present

## 2014-09-21 DIAGNOSIS — G8911 Acute pain due to trauma: Secondary | ICD-10-CM | POA: Diagnosis not present

## 2014-09-21 DIAGNOSIS — G8918 Other acute postprocedural pain: Secondary | ICD-10-CM | POA: Diagnosis not present

## 2014-09-22 DIAGNOSIS — M25561 Pain in right knee: Secondary | ICD-10-CM | POA: Diagnosis not present

## 2014-10-05 DIAGNOSIS — G8929 Other chronic pain: Secondary | ICD-10-CM | POA: Diagnosis not present

## 2014-10-05 DIAGNOSIS — M25561 Pain in right knee: Secondary | ICD-10-CM | POA: Diagnosis not present

## 2014-11-02 DIAGNOSIS — F319 Bipolar disorder, unspecified: Secondary | ICD-10-CM | POA: Diagnosis not present

## 2014-11-12 DIAGNOSIS — F319 Bipolar disorder, unspecified: Secondary | ICD-10-CM | POA: Diagnosis not present

## 2014-12-01 DIAGNOSIS — F319 Bipolar disorder, unspecified: Secondary | ICD-10-CM | POA: Diagnosis not present

## 2014-12-25 DIAGNOSIS — R196 Halitosis: Secondary | ICD-10-CM | POA: Diagnosis not present

## 2014-12-25 DIAGNOSIS — K219 Gastro-esophageal reflux disease without esophagitis: Secondary | ICD-10-CM | POA: Diagnosis not present

## 2014-12-25 DIAGNOSIS — M25439 Effusion, unspecified wrist: Secondary | ICD-10-CM | POA: Diagnosis not present

## 2014-12-25 DIAGNOSIS — J310 Chronic rhinitis: Secondary | ICD-10-CM | POA: Diagnosis not present

## 2014-12-25 DIAGNOSIS — M79661 Pain in right lower leg: Secondary | ICD-10-CM | POA: Diagnosis not present

## 2014-12-25 DIAGNOSIS — K5900 Constipation, unspecified: Secondary | ICD-10-CM | POA: Diagnosis not present

## 2015-01-06 ENCOUNTER — Emergency Department (HOSPITAL_COMMUNITY)
Admission: EM | Admit: 2015-01-06 | Discharge: 2015-01-07 | Disposition: A | Discharge status: Other Acute Inpatient Hospital | Payer: Medicare Other | Attending: Emergency Medicine | Admitting: Emergency Medicine

## 2015-01-06 ENCOUNTER — Encounter (HOSPITAL_COMMUNITY): Payer: Self-pay | Admitting: Emergency Medicine

## 2015-01-06 DIAGNOSIS — F419 Anxiety disorder, unspecified: Secondary | ICD-10-CM | POA: Diagnosis not present

## 2015-01-06 DIAGNOSIS — K219 Gastro-esophageal reflux disease without esophagitis: Secondary | ICD-10-CM | POA: Insufficient documentation

## 2015-01-06 DIAGNOSIS — M1990 Unspecified osteoarthritis, unspecified site: Secondary | ICD-10-CM | POA: Diagnosis not present

## 2015-01-06 DIAGNOSIS — Z8781 Personal history of (healed) traumatic fracture: Secondary | ICD-10-CM | POA: Diagnosis not present

## 2015-01-06 DIAGNOSIS — Y929 Unspecified place or not applicable: Secondary | ICD-10-CM | POA: Diagnosis not present

## 2015-01-06 DIAGNOSIS — Z87891 Personal history of nicotine dependence: Secondary | ICD-10-CM | POA: Diagnosis not present

## 2015-01-06 DIAGNOSIS — T50902A Poisoning by unspecified drugs, medicaments and biological substances, intentional self-harm, initial encounter: Secondary | ICD-10-CM

## 2015-01-06 DIAGNOSIS — T391X3A Poisoning by 4-Aminophenol derivatives, assault, initial encounter: Secondary | ICD-10-CM | POA: Insufficient documentation

## 2015-01-06 DIAGNOSIS — F1110 Opioid abuse, uncomplicated: Secondary | ICD-10-CM | POA: Insufficient documentation

## 2015-01-06 DIAGNOSIS — Y999 Unspecified external cause status: Secondary | ICD-10-CM | POA: Insufficient documentation

## 2015-01-06 DIAGNOSIS — F319 Bipolar disorder, unspecified: Secondary | ICD-10-CM | POA: Diagnosis not present

## 2015-01-06 DIAGNOSIS — T50904A Poisoning by unspecified drugs, medicaments and biological substances, undetermined, initial encounter: Secondary | ICD-10-CM | POA: Diagnosis not present

## 2015-01-06 DIAGNOSIS — T402X2A Poisoning by other opioids, intentional self-harm, initial encounter: Secondary | ICD-10-CM | POA: Diagnosis present

## 2015-01-06 DIAGNOSIS — Y939 Activity, unspecified: Secondary | ICD-10-CM | POA: Diagnosis not present

## 2015-01-06 DIAGNOSIS — I10 Essential (primary) hypertension: Secondary | ICD-10-CM | POA: Insufficient documentation

## 2015-01-06 DIAGNOSIS — Z862 Personal history of diseases of the blood and blood-forming organs and certain disorders involving the immune mechanism: Secondary | ICD-10-CM | POA: Diagnosis not present

## 2015-01-06 DIAGNOSIS — Z79899 Other long term (current) drug therapy: Secondary | ICD-10-CM | POA: Diagnosis not present

## 2015-01-06 DIAGNOSIS — T40601A Poisoning by unspecified narcotics, accidental (unintentional), initial encounter: Secondary | ICD-10-CM | POA: Diagnosis not present

## 2015-01-06 DIAGNOSIS — Z7982 Long term (current) use of aspirin: Secondary | ICD-10-CM | POA: Insufficient documentation

## 2015-01-06 LAB — CBC
HCT: 34 % — ABNORMAL LOW (ref 36.0–46.0)
HEMOGLOBIN: 11.2 g/dL — AB (ref 12.0–15.0)
MCH: 29 pg (ref 26.0–34.0)
MCHC: 32.9 g/dL (ref 30.0–36.0)
MCV: 88.1 fL (ref 78.0–100.0)
Platelets: 204 10*3/uL (ref 150–400)
RBC: 3.86 MIL/uL — AB (ref 3.87–5.11)
RDW: 14.7 % (ref 11.5–15.5)
WBC: 4.5 10*3/uL (ref 4.0–10.5)

## 2015-01-06 LAB — COMPREHENSIVE METABOLIC PANEL
ALK PHOS: 62 U/L (ref 38–126)
ALT: 16 U/L (ref 14–54)
ANION GAP: 9 (ref 5–15)
AST: 20 U/L (ref 15–41)
Albumin: 4.5 g/dL (ref 3.5–5.0)
BUN: 9 mg/dL (ref 6–20)
CALCIUM: 9.4 mg/dL (ref 8.9–10.3)
CO2: 26 mmol/L (ref 22–32)
Chloride: 104 mmol/L (ref 101–111)
Creatinine, Ser: 0.64 mg/dL (ref 0.44–1.00)
GFR calc non Af Amer: >60 mL/min (ref >60)
Glucose, Bld: 105 mg/dL — ABNORMAL HIGH (ref 65–99)
POTASSIUM: 3.9 mmol/L (ref 3.5–5.1)
Sodium: 139 mmol/L (ref 135–145)
TOTAL PROTEIN: 7.2 g/dL (ref 6.5–8.1)
Total Bilirubin: 0.4 mg/dL (ref 0.3–1.2)

## 2015-01-06 LAB — ACETAMINOPHEN LEVEL
ACETAMINOPHEN (TYLENOL), SERUM: 26 ug/mL (ref 10–30)
Acetaminophen (Tylenol), Serum: 35 ug/mL — ABNORMAL HIGH (ref 10–30)

## 2015-01-06 LAB — RAPID URINE DRUG SCREEN, HOSP PERFORMED
Amphetamines: NOT DETECTED
Barbiturates: NOT DETECTED
Benzodiazepines: NOT DETECTED
Cocaine: NOT DETECTED
Opiates: POSITIVE — AB
Tetrahydrocannabinol: NOT DETECTED

## 2015-01-06 LAB — CBG MONITORING, ED: GLUCOSE-CAPILLARY: 108 mg/dL — AB (ref 65–99)

## 2015-01-06 LAB — SALICYLATE LEVEL: Salicylate Lvl: <4 mg/dL (ref 2.8–30.0)

## 2015-01-06 LAB — ETHANOL: Alcohol, Ethyl (B): <5 mg/dL (ref <5)

## 2015-01-06 MED ORDER — ONDANSETRON HCL 4 MG PO TABS
4.0000 mg | ORAL_TABLET | Freq: Three times a day (TID) | ORAL | Status: DC | PRN
  Administered 2015-01-06: 4 mg via ORAL
  Filled 2015-01-06: qty 1

## 2015-01-06 MED ORDER — LORAZEPAM 1 MG PO TABS
1.0000 mg | ORAL_TABLET | Freq: Three times a day (TID) | ORAL | Status: DC | PRN
  Administered 2015-01-06: 1 mg via ORAL
  Filled 2015-01-06: qty 1

## 2015-01-06 MED ORDER — TRAMADOL HCL 50 MG PO TABS: 50.0000 mg | ORAL_TABLET | Freq: Once | ORAL | Status: DC

## 2015-01-06 NOTE — ED Provider Notes (Signed)
CSN: [id number]     Arrival date & time 01/06/15  2426 History   First MD Initiated Contact with Patient 01/06/15 [phone]     Chief Complaint  Patient presents with  . Drug Overdose     (Consider location/radiation/quality/duration/timing/severity/associated sxs/prior Treatment) Patient is a 59 y.o. female presenting with Overdose. The history is provided by the patient. No language interpreter was used.  Drug Overdose This is a new problem. The current episode started today. Pertinent negatives include no chills or fever. Associated symptoms comments: Presents via EMS after taking an overdose of her Norco 10/325 mg tablets. EMS was called by her brother who she told of the overdose. Ingestion occurred around 2:00 am this morning. No vomiting. She denies taking more than usual doses of any of her other medications. No history of suicidal attempt. She states she has been under a significant amount of stress and tonight had an argument with her ex-husband. .    Past Medical History  Diagnosis Date  . Hypertension   . Mitral valve prolapse     cardiologist-   dr Burt Knack  . Anxiety   . GERD (gastroesophageal reflux disease)   . Bipolar 1 disorder   . Osteoarthritis   . Rotator cuff syndrome     right side  . Chest pain     denies at this time 10/7  . Anemia   . Depression   . Right patella fracture   . Moderate mitral regurgitation   . Memory loss     pt states mild memory loss   Past Surgical History  Procedure Laterality Date  . Cesarean section    . Tonsillectomy  09-07-2003  . Cauterization post tonsillectomy  09-25-2003  . Anterior cervical decomp/discectomy fusion  05-19-2009    C4 --  C7  and C5 corpectomy  . Re-exploration anterior cervical wound and evacuation hematoma  05-19-2009  . Cardiovascular stress test  11-20-2012  dr cooper    normal perfusion study/  no ischemia/  ef 62%  . Transthoracic echocardiogram  01-07-2014    grade II diastolic dysfunction/  ef 55-60%/   mild late systolic mitral valve prolapse of anterior leaflet/  moderate MR  . Patellectomy Right 04/03/2014    Procedure: PATELLECTOMY;  Surgeon: Sydnee Cabal, MD;  Location: [facility];  Service: Orthopedics;  Laterality: Right;   Family History  Problem Relation Age of Onset  . Heart disease Mother   . Heart attack Father   . Colon polyps Father   . Skin cancer Father   . Esophageal cancer Maternal Uncle   . Other Maternal Aunt     stomcah polyps  . Bone cancer Paternal Grandfather   . Uterine cancer Paternal Grandmother   . Liver cancer Paternal Uncle   . Cirrhosis Paternal Uncle    History  Substance Use Topics  . Smoking status: Former Smoker -- 1.00 packs/day for 30 years    Types: Cigarettes  . Smokeless tobacco: Never Used     Comment: uses hooka 1 ounce/2 weeks  . Alcohol Use: 0.6 oz/week    1 Cans of beer per week     Comment: occasional beer   OB History    No data available     Review of Systems  Constitutional: Negative for fever and chills.  HENT: Negative.   Respiratory: Negative.   Cardiovascular: Negative.   Gastrointestinal: Negative.   Musculoskeletal: Negative.   Skin: Negative.   Neurological: Negative.   Psychiatric/Behavioral: Positive for  suicidal ideas and dysphoric mood.      Allergies  Ibuprofen and Lactose intolerance (gi)  Home Medications   Prior to Admission medications   Medication Sig Start Date End Date Taking? Authorizing Provider  amLODipine (NORVASC) 5 MG tablet Take 1 tablet by mouth daily. 10/01/12  Yes Historical Provider, MD  aspirin EC 81 MG tablet Take 81 mg by mouth daily.   Yes Historical Provider, MD  CHLOROPHYLL PO Take 1 tablet by mouth daily.   Yes Historical Provider, MD  clonazePAM (KLONOPIN) 1 MG tablet Take 1-2 mg by mouth daily as needed for anxiety.    Yes Historical Provider, MD  Copper Gluconate (COPPER CAPS PO) Take 1 capsule by mouth daily.   Yes Historical Provider, MD  fluticasone  (FLONASE) 50 MCG/ACT nasal spray Place 1 spray into both nostrils daily as needed. For congestion. 12/25/14  Yes Historical Provider, MD  gabapentin (NEURONTIN) 300 MG capsule Take 300 mg by mouth 2 (two) times daily as needed (for nerve pain.).   Yes Historical Provider, MD  HYDROcodone-acetaminophen (NORCO) 10-325 MG per tablet Take 1 tablet by mouth 3 (three) times daily as needed. For pain. 01/04/15  Yes Historical Provider, MD  lamoTRIgine (LAMICTAL) 150 MG tablet Take 150 mg by mouth at bedtime. 11/04/14  Yes Historical Provider, MD  losartan (COZAAR) 100 MG tablet Take 100 mg by mouth daily as needed (for blood pressue greater 180/60).    Yes Historical Provider, MD  magnesium oxide (MAG-OX) 400 MG tablet Take 400 mg by mouth daily.   Yes Historical Provider, MD  omeprazole (PRILOSEC) 20 MG capsule Take 20 mg by mouth every morning.   Yes Historical Provider, MD  vitamin C (ASCORBIC ACID) 500 MG tablet Take 500 mg by mouth daily.   Yes Historical Provider, MD  zinc gluconate 50 MG tablet Take 50 mg by mouth 2 (two) times daily.   Yes Historical Provider, MD   BP 143/76 mmHg  Pulse 71  Temp(Src) 99.4 F (37.4 C) (Oral)  Resp 12  Ht 5\' 2"  (1.575 m)  Wt 136 lb (61.689 kg)  BMI 24.87 kg/m2  SpO2 95% Physical Exam  Constitutional: She is oriented to person, place, and time. She appears well-developed and well-nourished.  HENT:  Head: Normocephalic.  Neck: Normal range of motion. Neck supple.  Cardiovascular: Normal rate and regular rhythm.   Pulmonary/Chest: Effort normal and breath sounds normal.  Abdominal: Soft. Bowel sounds are normal. There is no tenderness. There is no rebound and no guarding.  Musculoskeletal: Normal range of motion.  Neurological: She is alert and oriented to person, place, and time.  Skin: Skin is warm and dry. No rash noted.  Psychiatric: Her speech is normal and behavior is normal. She exhibits a depressed mood. She expresses suicidal ideation.    ED Course   Procedures (including critical care time) Labs Review Labs Reviewed  CBC - Abnormal; Notable for the following:    RBC 3.86 (*)    Hemoglobin 11.2 (*)    HCT 34.0 (*)    All other components within normal limits  CBG MONITORING, ED - Abnormal; Notable for the following:    Glucose-Capillary 108 (*)    All other components within normal limits  COMPREHENSIVE METABOLIC PANEL  ETHANOL  SALICYLATE LEVEL  ACETAMINOPHEN LEVEL  URINE RAPID DRUG SCREEN, HOSP PERFORMED   Results for orders placed or performed during the hospital encounter of 01/06/15  Comprehensive metabolic panel  Result Value Ref Range   Sodium 139  135 - 145 mmol/L   Potassium 3.9 3.5 - 5.1 mmol/L   Chloride 104 101 - 111 mmol/L   CO2 26 22 - 32 mmol/L   Glucose, Bld 105 (H) 65 - 99 mg/dL   BUN 9 6 - 20 mg/dL   Creatinine, Ser 0.64 0.44 - 1.00 mg/dL   Calcium 9.4 8.9 - 10.3 mg/dL   Total Protein 7.2 6.5 - 8.1 g/dL   Albumin 4.5 3.5 - 5.0 g/dL   AST 20 15 - 41 U/L   ALT 16 14 - 54 U/L   Alkaline Phosphatase 62 38 - 126 U/L   Total Bilirubin 0.4 0.3 - 1.2 mg/dL   GFR calc non Af Amer >60 >60 mL/min   GFR calc Af Amer >60 >60 mL/min   Anion gap 9 5 - 15  Ethanol (ETOH)  Result Value Ref Range   Alcohol, Ethyl (B) <5 <5 mg/dL  Salicylate level  Result Value Ref Range   Salicylate Lvl <5.0 2.8 - 30.0 mg/dL  Acetaminophen level  Result Value Ref Range   Acetaminophen (Tylenol), Serum 26 10 - 30 ug/mL  CBC  Result Value Ref Range   WBC 4.5 4.0 - 10.5 K/uL   RBC 3.86 (L) 3.87 - 5.11 MIL/uL   Hemoglobin 11.2 (L) 12.0 - 15.0 g/dL   HCT 34.0 (L) 36.0 - 46.0 %   MCV 88.1 78.0 - 100.0 fL   MCH 29.0 26.0 - 34.0 pg   MCHC 32.9 30.0 - 36.0 g/dL   RDW 14.7 11.5 - 15.5 %   Platelets 204 150 - 400 K/uL  CBG monitoring, ED  Result Value Ref Range   Glucose-Capillary 108 (H) 65 - 99 mg/dL     Imaging Review No results found.   EKG Interpretation   Date/Time:  Wednesday January 06 2015 03:57:53 EDT Ventricular  Rate:  73 PR Interval:  153 QRS Duration: 79 QT Interval:  413 QTC Calculation: 455 R Axis:   59 Text Interpretation:  Sinus rhythm No significant change since last  tracing Confirmed by WARD,  DO, KRISTEN ([id number]) on 01/06/2015 4:35:26 AM      MDM   Final diagnoses:  None    1. Intentional overdose  The patient reports clear intention to harm herself when she took an overdose of her Norco. Once medically cleared she will need TTS consultation for treatment.  4-hour Tylenol level pending for 6:00 am - Patient care transferred to [facility], PA-C, pending 4-hour level and start of Psychiatric evaluation orders.    Charlann Lange, PA-C 01/06/15 [place], DO 01/06/15 [phone]

## 2015-01-06 NOTE — BH Assessment (Signed)
Consulted with Charmaine Downs, NP who recommends patient for inpatient treatment at this time. TTS to seek placement.
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
## 2015-01-06 NOTE — ED Provider Notes (Signed)
The patient has been cleared by poison control, Tylenol level is undetectable, she has been accepted at behavioral health.  Noemi Chapel, MD 01/06/15 2203

## 2015-01-06 NOTE — ED Notes (Signed)
Dr. Sabra Heck notified of pt requesting pain medication. Dr. Sabra Heck also notified of the pt's tylenol level, pt has been cleared by poison control and that the pt has been accepted to a bed at [facility].

## 2015-01-06 NOTE — ED Notes (Signed)
PA at bedside.

## 2015-01-06 NOTE — ED Notes (Signed)
She is sleeping soundly in the presence of her sitter. 

## 2015-01-06 NOTE — ED Notes (Signed)
Notified pt that tramadol was ordered for pain. Pt states she can not take tramadol because it makes her too drowsy and she will not be able to wake up in the morning. Pt states she will just wait for her next dose of ativan. Explained to the pt that the earliest that it could be given was 2320, pt verbalized understanding.

## 2015-01-06 NOTE — ED Notes (Signed)
Inventoried pt's personal belongings and moved them to the nursing station  Explained to pt why personal belongings were removed from room  Pt is very concerned that she cannot keep her cell phone  Pt is c/o nausea  Given crackers and ginger ale

## 2015-01-06 NOTE — BH Assessment (Signed)
[place] Assessment Progress Note  The following facilities have been contacted to seek placement for this pt, with results as noted:  Beds available, information sent, decision pending:  [facility]  No beds available, but accepting referrals for future consideration; information sent:  [facility]  At capacity:  Verdis Prime, [place] Triage Specialist [phone]
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
## 2015-01-06 NOTE — ED Provider Notes (Signed)
6:10 AM Handoff from [place] PA-C at shift change.   Patient who ingested approximately 100mg  hydrocodone and 3250mg  acetaminophen at 2AM in a gesture to harm herself. She called brother who called EMS. She is pending 4 hour Tylenol level. She is currently voluntary.  Plan: when 4 hour Tylenol level results, will talk with TTS to obtain eval, possible inpatient treatment.   BP 143/76 mmHg  Pulse 71  Temp(Src) 99.4 F (37.4 C) (Oral)  Resp 12  Ht 5\' 2"  (1.575 m)  Wt 136 lb (61.689 kg)  BMI 24.87 kg/m2  SpO2 95%   7:08 AM 4-hr Tylenol is 35. She would have to have level of 250 per [place] nomogram. She is medically cleared.    12:41 PM Inpt treatment recommended. Pending placement.   Carlisle Cater, PA-C 01/06/15 [place], DO 01/07/15 0002

## 2015-01-06 NOTE — ED Notes (Signed)
Notified poison control of pt coming in with overdose. Updated poison control of pt's last vitals, home medications, and pt's current condition. Per poison control representative no further recommendations at this time due to the tylenol level being non toxic four hours after ingestion.

## 2015-01-06 NOTE — ED Notes (Signed)
TTS consultant is here to see her--she remains in no distress.

## 2015-01-06 NOTE — BHH Counselor (Signed)
Aisha from [facility] called and stated that [facility] has been accepted to [facility], Dr. Enzo Bi, accepting after 8AM on 01/07/2015- call [phone] for Nurse report. Aisha asked about patient transportation due to patient being voluntary.  Counselor spoke with patient about treatment at [facility] and patient states that she would prefer to stay locally. Patient states that she does not have access to transportation and it would be difficult for her to find a ride from treatment. Patient also states that her therapist and psychiatrist are local and she has a 46 year old daughter who she would like to remain close to. Patient states that she she also has to move out of her apartment by a certain date and she would have the stress of finding transportation to [place] along with the stress of finding help with moving. Patient states that she "made a mistake" and is overwhelmed by the amount of stress and feels that out of town treatment would be more stressful, however, she is willing to go if no local treatment is found.    Counselor contacted [facility] to request an appropriate bed from Inocencio Homes, RN, [facility] for patient to be treated locally. Herbert Spires states that she will review patient chart and contact this Probation officer with information on acceptance.    Herbert Spires states that the patient will need to be medically cleared by poison control and another tylenol level will need to be ordered. Herbert Spires states that if she is cleared by poison control and the labs are satisfactory patient can be accepted to [id number]. Shea Evans attending and nurse report can be called at [phone].  Counselor contacted patients nurse and requested that she call poison control for a recommendation. Counselor contacted Dr. Sabra Heck, EDP to order another tylenol level and that patient could potentially be accepted to [facility] if the labs are satisfactory. Dr. Sabra Heck states that he will complete the order as requested. Informed nurse of patients potential  acceptance based on satisfactory labs. If patients labs are not satisfactory for [facility] patient will go to [facility] voluntarily in the morning after 8am. Patient nurse aware of current plan.
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
## 2015-01-06 NOTE — ED Notes (Signed)
Isha, from [facility] called regarding pt's medical history. Updates given to representative. Call transferred to counselor for further questions.

## 2015-01-06 NOTE — ED Notes (Signed)
Nichole Gibbs from [facility] and is given update of condition; and states they will close out her case with no additional treatment recommended at this time.

## 2015-01-07 ENCOUNTER — Encounter (HOSPITAL_COMMUNITY): Payer: Self-pay

## 2015-01-07 ENCOUNTER — Inpatient Hospital Stay (HOSPITAL_COMMUNITY)
Admission: EM | Admit: 2015-01-07 | Discharge: 2015-01-13 | DRG: 885 | Disposition: A | Discharge status: Home / Self Care | Payer: Medicare Other | Source: Intra-hospital | Attending: Psychiatry | Admitting: Psychiatry

## 2015-01-07 DIAGNOSIS — Z87891 Personal history of nicotine dependence: Secondary | ICD-10-CM | POA: Diagnosis not present

## 2015-01-07 DIAGNOSIS — F333 Major depressive disorder, recurrent, severe with psychotic symptoms: Secondary | ICD-10-CM | POA: Diagnosis not present

## 2015-01-07 DIAGNOSIS — R45851 Suicidal ideations: Secondary | ICD-10-CM

## 2015-01-07 DIAGNOSIS — G894 Chronic pain syndrome: Secondary | ICD-10-CM | POA: Diagnosis present

## 2015-01-07 DIAGNOSIS — F332 Major depressive disorder, recurrent severe without psychotic features: Secondary | ICD-10-CM | POA: Insufficient documentation

## 2015-01-07 DIAGNOSIS — I10 Essential (primary) hypertension: Secondary | ICD-10-CM | POA: Diagnosis present

## 2015-01-07 LAB — TSH: TSH: 0.609 u[IU]/mL (ref 0.350–4.500)

## 2015-01-07 MED ORDER — AMLODIPINE BESYLATE 5 MG PO TABS
5.0000 mg | ORAL_TABLET | Freq: Every day | ORAL | Status: DC
  Administered 2015-01-07 – 2015-01-09 (×3): 5 mg via ORAL
  Filled 2015-01-07 (×5): qty 1

## 2015-01-07 MED ORDER — ALUM & MAG HYDROXIDE-SIMETH 200-200-20 MG/5ML PO SUSP
30.0000 mL | ORAL | Status: DC | PRN
Start: 2015-01-07 — End: 2015-01-13
  Administered 2015-01-07: 30 mL via ORAL
  Filled 2015-01-07: qty 30

## 2015-01-07 MED ORDER — PANTOPRAZOLE SODIUM 40 MG PO TBEC
40.0000 mg | DELAYED_RELEASE_TABLET | Freq: Every day | ORAL | Status: DC
  Administered 2015-01-07 – 2015-01-13 (×7): 40 mg via ORAL
  Filled 2015-01-07 (×9): qty 1

## 2015-01-07 MED ORDER — LAMOTRIGINE 100 MG PO TABS
ORAL_TABLET | ORAL | Status: AC
  Administered 2015-01-07: 150 mg via ORAL
  Filled 2015-01-07: qty 1

## 2015-01-07 MED ORDER — FLUTICASONE PROPIONATE 50 MCG/ACT NA SUSP
2.0000 | Freq: Every day | NASAL | Status: DC
  Administered 2015-01-07 – 2015-01-13 (×7): 2 via NASAL
  Filled 2015-01-07: qty 16

## 2015-01-07 MED ORDER — GABAPENTIN 300 MG PO CAPS
300.0000 mg | ORAL_CAPSULE | Freq: Three times a day (TID) | ORAL | Status: DC
  Administered 2015-01-07 – 2015-01-13 (×18): 300 mg via ORAL
  Filled 2015-01-07 (×7): qty 1
  Filled 2015-01-07 (×2): qty 9
  Filled 2015-01-07: qty 1
  Filled 2015-01-07: qty 9
  Filled 2015-01-07 (×3): qty 1
  Filled 2015-01-07: qty 9
  Filled 2015-01-07 (×2): qty 1
  Filled 2015-01-07: qty 9
  Filled 2015-01-07 (×9): qty 1

## 2015-01-07 MED ORDER — HYDROXYZINE HCL 25 MG PO TABS
25.0000 mg | ORAL_TABLET | Freq: Four times a day (QID) | ORAL | Status: DC | PRN
Start: 2015-01-07 — End: 2015-01-11
  Administered 2015-01-07 – 2015-01-11 (×4): 25 mg via ORAL
  Filled 2015-01-07 (×4): qty 1

## 2015-01-07 MED ORDER — LOSARTAN POTASSIUM 50 MG PO TABS: 100.0000 mg | ORAL_TABLET | Freq: Every day | ORAL | Status: DC | PRN

## 2015-01-07 MED ORDER — GABAPENTIN 300 MG PO CAPS
300.0000 mg | ORAL_CAPSULE | Freq: Two times a day (BID) | ORAL | Status: DC | PRN
  Administered 2015-01-07 (×2): 300 mg via ORAL
  Filled 2015-01-07 (×2): qty 1

## 2015-01-07 MED ORDER — DICLOFENAC SODIUM 1 % TD GEL
4.0000 g | Freq: Four times a day (QID) | TRANSDERMAL | Status: DC
  Administered 2015-01-07 – 2015-01-13 (×26): 4 g via TOPICAL
  Filled 2015-01-07 (×2): qty 100

## 2015-01-07 MED ORDER — TRAZODONE HCL 50 MG PO TABS
50.0000 mg | ORAL_TABLET | Freq: Every evening | ORAL | Status: DC | PRN
  Filled 2015-01-07: qty 1

## 2015-01-07 MED ORDER — MAGNESIUM HYDROXIDE 400 MG/5ML PO SUSP
30.0000 mL | Freq: Every day | ORAL | Status: DC | PRN
  Administered 2015-01-08 – 2015-01-10 (×2): 30 mL via ORAL
  Filled 2015-01-07 (×2): qty 30

## 2015-01-07 MED ORDER — LAMOTRIGINE 100 MG PO TABS
150.0000 mg | ORAL_TABLET | Freq: Every day | ORAL | Status: DC
  Administered 2015-01-07 – 2015-01-12 (×7): 150 mg via ORAL
  Filled 2015-01-07: qty 5
  Filled 2015-01-07 (×2): qty 1
  Filled 2015-01-07: qty 5
  Filled 2015-01-07 (×2): qty 1
  Filled 2015-01-07: qty 5
  Filled 2015-01-07 (×4): qty 1

## 2015-01-07 MED ORDER — TRAZODONE HCL 50 MG PO TABS
50.0000 mg | ORAL_TABLET | Freq: Every evening | ORAL | Status: DC | PRN
  Administered 2015-01-08 – 2015-01-10 (×4): 50 mg via ORAL
  Filled 2015-01-07 (×13): qty 1

## 2015-01-07 MED ORDER — OXYCODONE HCL ER 10 MG PO T12A
10.0000 mg | EXTENDED_RELEASE_TABLET | Freq: Two times a day (BID) | ORAL | Status: DC
  Administered 2015-01-07 – 2015-01-13 (×12): 10 mg via ORAL
  Filled 2015-01-07 (×12): qty 1

## 2015-01-07 MED ORDER — LAMOTRIGINE 25 MG PO TABS
ORAL_TABLET | ORAL | Status: AC
  Filled 2015-01-07: qty 1
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
## 2015-01-07 NOTE — Progress Notes (Addendum)
Patient ID: Nichole Gibbs, female   DOB: 1955-10-01, 59 y.o.   MRN: [id number] PER STATE REGULATIONS 482.30  THIS CHART WAS REVIEWED FOR MEDICAL NECESSITY WITH RESPECT TO THE PATIENT'S ADMISSION/DURATION OF STAY.  NEXT REVIEW DATE: 01/11/15  Roma Schanz, RN, BSN CASE MANAGER

## 2015-01-07 NOTE — BHH Suicide Risk Assessment (Signed)
[place] INPATIENT:  Family/Significant Other Suicide Prevention Education  Suicide Prevention Education:  Patient Refusal for Family/Significant Other Suicide Prevention Education: The patient Nichole Gibbs has refused to provide written consent for family/significant other to be provided Family/Significant Other Suicide Prevention Education during admission and/or prior to discharge.  Physician notified.  Patient states she has no one to designate for SPE, daughter is a minor and patient does not want her to be the contact, states she is estranged from all family and friends.  SPE will be reviewed w patient.   Beverely Pace 01/07/2015, 12:50 PM

## 2015-01-07 NOTE — Progress Notes (Addendum)
Undersign spoke with Dr.Candice Smith-[facility];  in regards to patient care prior to admission per NP request for reccomendations. Dr. Tamala Julian reports patient broke contract and she would not be prescribing narcotics once discharged from inpatient services. Reccomended pain management clinic through orthopedic surgeon after D/C and lidocaine patches.   Above information given to NP and MD.
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
## 2015-01-07 NOTE — BHH Group Notes (Signed)
[place] LCSW Group Therapy  01/07/2015 6:26 PM  Type of Therapy:  Group Therapy  Participation Level:  Did Not Attend, invited.  Was w MD.   Beverely Pace 01/07/2015, 6:26 PM

## 2015-01-07 NOTE — Progress Notes (Signed)
Per NP and patient request patient signed consent form to obtain information on pain management medication prior to admission.
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
## 2015-01-07 NOTE — Clinical Social Work Note (Signed)
Phone call from Charlesetta Ivory [phone]) -states he is father of patient's daughter.  Expressed frustration that "someone" has called their 57 yo daughter, states duaghter has been asked to come to [facility], bring patient's clothes and medications and transport patient home.  Father requests that pt not be allowed to contact daughter unless firm discharge plans are in place.  CSW explained that no information can be given to anyone about patient and did not confirm/deny patient is currently hospitalized.  Undersigned did not contact daughter - patient declined to provide any information re family contact and requested that no SPE be completed, requested that daughter not be contacted as she is a minor and unable to assume adult responsibility for patient.  AC notified as Osborn Coho states that he has previously spoken w man named IT trainer at [facility].  Edwyna Shell, LCSW Clinical Social Worker

## 2015-01-07 NOTE — Tx Team (Signed)
Initial Interdisciplinary Treatment Plan   PATIENT STRESSORS: Financial difficulties Health problems Medication change or noncompliance   PATIENT STRENGTHS: Average or above average intelligence General fund of knowledge Motivation for treatment/growth   PROBLEM LIST: Problem List/Patient Goals Date to be addressed Date deferred Reason deferred Estimated date of resolution  I want to get help for depression and anxiety                                                       DISCHARGE CRITERIA:  Ability to meet basic life and health needs Improved stabilization in mood, thinking, and/or behavior Verbal commitment to aftercare and medication compliance  PRELIMINARY DISCHARGE PLAN: Attend aftercare/continuing care group Outpatient therapy  PATIENT/FAMIILY INVOLVEMENT: This treatment plan has been presented to and reviewed with the patient, Nichole Gibbs, and/or family member, .  The patient and family have been given the opportunity to ask questions and make suggestions.  Migdalia Dk 01/07/2015, 1:20 AM

## 2015-01-07 NOTE — Plan of Care (Signed)
Problem: Diagnosis: Increased Risk For Suicide Attempt Goal: LTG-Patient Will Report Improved Mood and Deny Suicidal LTG (by discharge) Patient will report improved mood and deny suicidal ideation.  Outcome: Not Progressing Patient denies suicidal ideation at this time.

## 2015-01-07 NOTE — Progress Notes (Signed)
Adult Psychoeducational Group Note  Date:  01/07/2015 Time:  4:12 PM  Group Topic/Focus:    Participation Level:  Did Not Attend  Participation Quality:    Affect:   Cognitive:   Insight:   Engagement in Group:   Modes of Intervention:    Additional Comments:  Pt was asleep.   Dionne Bucy 01/07/2015, 4:12 PM
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
## 2015-01-07 NOTE — Progress Notes (Signed)
D:Per patient self inventory form patient reports she slept good last night with the use of sleep medication. She reports fair appetite, low energy level, and good concentration. She reports depression 0/10, hopelessness 0/10, anxiety 0/10- all on 0-10 scale, 10 being the worse. She reports physical pain location right knee, and back. She reports her goal for today is to :"manage stress, manage pain, find ways to improve health naturally." Gait unsteady.   A:Special checks q 15 mins in place for safety. Medication administered per MD order (see eMAR). Wheelchair provided per MD order. Encouragement and support provided.  R:Safety maintained. Compliant with medication regimen. No falls at this time. Will continue to monitor.

## 2015-01-07 NOTE — Progress Notes (Signed)
This Probation officer received call from a female who identified himself as Kennieth Francois, father of the patient's daughter [phone]). Hollyn gave verbal consent to tell Mr. Ronnald Ramp that she is here. Concha Norway RN-BC

## 2015-01-07 NOTE — Clinical Social Work Note (Signed)
CSW spoke w Hamilton Capri, patient's Producer, television/film/video, at patient's request - worker is contacting both landlords to see if patient can move directly from one unit to another, not having a gap where she fears she will be homeless as she anticipates.  Edwyna Shell, LCSW Clinical Social Worker

## 2015-01-08 LAB — GABAPENTIN LEVEL: GABAPENTIN LVL: 2.6 ug/mL

## 2015-01-08 LAB — GLUCOSE, CAPILLARY: GLUCOSE-CAPILLARY: 114 mg/dL — AB (ref 65–99)

## 2015-01-08 NOTE — Progress Notes (Signed)
D Pt. Denies SI and HI, no complaints of discomfort. Pt. Is receiving  Oxy 12 hr. Release   A Writer offered support and encouragement, discussed coping skills with pt.   R Pt. Remains safe on the unit.  The medication is relieving the pt.'s pain.
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
## 2015-01-08 NOTE — Progress Notes (Signed)
[facility] MD Progress Note  01/08/2015 2:12 PM Nichole Gibbs  MRN:  [id number] Subjective:  Pt interviewed. Chart reviewed. Pt reports feeling better, and feels more hopeful, has support, and cared for. She reports poor sleep, due to another disruptive/loud pt on hall. Appetite good. Tolerating meds well. Pt has been attending groups. Pt denies SI/HI/AVH.  Principal Problem: MDD (major depressive disorder), recurrent episode, severe Diagnosis:   Patient Active Problem List   Diagnosis Date Noted  . MDD (major depressive disorder), recurrent episode, severe [F33.2] 01/07/2015  . Chronic pain syndrome [G89.4] 01/07/2015  . S/P knee surgery [Z98.89] 04/03/2014  . Unspecified constipation [K59.00] 10/14/2013  . Halitosis [R19.6] 10/14/2013  . MITRAL REGURGITATION [I08.0] 04/15/2009  . HYPERTENSION, UNSPECIFIED [I10] 04/15/2009  . MITRAL VALVE PROLAPSE, HX OF [Z86.79] 04/15/2009  . DSORD BIPOLAR I, DEPRESSED WITH PSYCHOSIS [F31.9] 10/27/2006  . ANXIETY [F41.1] 10/27/2006  . TOBACCO ABUSE [Z72.0] 10/27/2006  . GERD [K21.9] 10/27/2006  . OSTEOARTHRITIS, HANDS, BILATERAL [M19.049] 10/27/2006  . SYNDROME, ROTATOR CUFF NOS [M75.50] 10/27/2006   Total Time spent with patient: 20 minutes   Past Medical History:  Past Medical History  Diagnosis Date  . Hypertension   . Mitral valve prolapse     cardiologist-   dr Burt Knack  . Anxiety   . GERD (gastroesophageal reflux disease)   . Bipolar 1 disorder   . Osteoarthritis   . Rotator cuff syndrome     right side  . Chest pain     denies at this time 10/7  . Anemia   . Depression   . Right patella fracture   . Moderate mitral regurgitation   . Memory loss     pt states mild memory loss    Past Surgical History  Procedure Laterality Date  . Cesarean section    . Tonsillectomy  09-07-2003  . Cauterization post tonsillectomy  09-25-2003  . Anterior cervical decomp/discectomy fusion  05-19-2009    C4 --  C7  and C5 corpectomy  .  Re-exploration anterior cervical wound and evacuation hematoma  05-19-2009  . Cardiovascular stress test  11-20-2012  dr cooper    normal perfusion study/  no ischemia/  ef 62%  . Transthoracic echocardiogram  01-07-2014    grade II diastolic dysfunction/  ef 55-60%/  mild late systolic mitral valve prolapse of anterior leaflet/  moderate MR  . Patellectomy Right 04/03/2014    Procedure: PATELLECTOMY;  Surgeon: Sydnee Cabal, MD;  Location: [facility];  Service: Orthopedics;  Laterality: Right;   Family History:  Family History  Problem Relation Age of Onset  . Heart disease Mother   . Heart attack Father   . Colon polyps Father   . Skin cancer Father   . Esophageal cancer Maternal Uncle   . Other Maternal Aunt     stomcah polyps  . Bone cancer Paternal Grandfather   . Uterine cancer Paternal Grandmother   . Liver cancer Paternal Uncle   . Cirrhosis Paternal Uncle    Social History:  History  Alcohol Use  . 0.6 oz/week  . 1 Cans of beer per week    Comment: occasional beer     History  Drug Use No    History   Social History  . Marital Status: Divorced    Spouse Name: N/A  . Number of Children: 3  . Years of Education: N/A   Occupational History  . DISABILITY    Social History Main Topics  . Smoking status: Former  Smoker -- 1.00 packs/day for 30 years    Types: Cigarettes  . Smokeless tobacco: Never Used     Comment: uses hooka 1 ounce/2 weeks  . Alcohol Use: 0.6 oz/week    1 Cans of beer per week     Comment: occasional beer  . Drug Use: No  . Sexual Activity: Not on file   Other Topics Concern  . None   Social History Narrative   Additional History:    Sleep: Poor  Appetite:  Fair   Assessment:   Musculoskeletal: Strength & Muscle Tone: abnormal Gait & Station: unsteady Patient leans: N/A   Psychiatric Specialty Exam: Physical Exam  ROS  Blood pressure 120/61, pulse 72, temperature 98.1 F (36.7 C), temperature source  Oral, resp. rate 16, height 5' 2.5" (1.588 m), weight 60.782 kg (134 lb).Body mass index is 24.1 kg/(m^2).  General Appearance: Fairly Groomed  Engineer, water::  Minimal  Speech:  Normal Rate  Volume:  Decreased  Mood:  Anxious  Affect:  Congruent, Depressed and Tearful  Thought Process:  Coherent and Goal Directed  Orientation:  Full (Time, Place, and Person)  Thought Content:  Rumination  Suicidal Thoughts:  No  Homicidal Thoughts:  No  Memory:  Immediate;   Fair Recent;   Fair Remote;   Fair  Judgement:  Fair  Insight:  Fair  Psychomotor Activity:  Decreased  Concentration:  Poor  Recall:  [employer] of Knowledge:Fair  Language: Fair  Akathisia:  Negative  Handed:  Right  AIMS (if indicated):     Assets:  Communication Skills Desire for Improvement Housing Social Support Talents/Skills  ADL's:  Intact  Cognition: WNL  Sleep:  Number of Hours: 2.25     Current Medications: Current Facility-Administered Medications  Medication Dose Route Frequency Provider Last Rate Last Dose  . alum & mag hydroxide-simeth (MAALOX/MYLANTA) 200-200-20 MG/5ML suspension 30 mL  30 mL Oral Q4H PRN Laverle Hobby, PA-C   30 mL at 01/07/15 1810  . amLODipine (NORVASC) tablet 5 mg  5 mg Oral Daily Laverle Hobby, PA-C   5 mg at 01/08/15 7014  . diclofenac sodium (VOLTAREN) 1 % transdermal gel 4 g  4 g Topical QID Laverle Hobby, PA-C   4 g at 01/08/15 1256  . fluticasone (FLONASE) 50 MCG/ACT nasal spray 2 spray  2 spray Each Nare Daily Laverle Hobby, PA-C   2 spray at 01/08/15 [phone]  . gabapentin (NEURONTIN) capsule 300 mg  300 mg Oral TID Shuvon B Rankin, NP   300 mg at 01/08/15 1256  . hydrOXYzine (ATARAX/VISTARIL) tablet 25 mg  25 mg Oral Q6H PRN Laverle Hobby, PA-C   25 mg at 01/08/15 0830  . lamoTRIgine (LAMICTAL) tablet 150 mg  150 mg Oral QHS Laverle Hobby, PA-C   150 mg at 01/07/15 2222  . losartan (COZAAR) tablet 100 mg  100 mg Oral Daily PRN Laverle Hobby, PA-C      .  magnesium hydroxide (MILK OF MAGNESIA) suspension 30 mL  30 mL Oral Daily PRN Laverle Hobby, PA-C   30 mL at 01/08/15 0240  . OxyCODONE (OXYCONTIN) 12 hr tablet 10 mg  10 mg Oral Q12H Shuvon B Rankin, NP   10 mg at 01/08/15 0826  . pantoprazole (PROTONIX) EC tablet 40 mg  40 mg Oral Daily Laverle Hobby, PA-C   40 mg at 01/08/15 0827  . traZODone (DESYREL) tablet 50 mg  50 mg Oral QHS,MR X 1  Laverle Hobby, PA-C   50 mg at 01/07/15 0158    Lab Results:  Results for orders placed or performed during the hospital encounter of 01/07/15 (from the past 48 hour(s))  TSH     Status: None   Collection Time: 01/07/15  6:22 AM  Result Value Ref Range   TSH 0.609 0.350 - 4.500 uIU/mL    Comment: Performed at [facility]    Physical Findings: AIMS: Facial and Oral Movements Muscles of Facial Expression: None, normal Lips and Perioral Area: None, normal Jaw: None, normal Tongue: None, normal,Extremity Movements Upper (arms, wrists, hands, fingers): None, normal Lower (legs, knees, ankles, toes): None, normal, Trunk Movements Neck, shoulders, hips: None, normal, Overall Severity Severity of abnormal movements (highest score from questions above): None, normal Incapacitation due to abnormal movements: None, normal Patient's awareness of abnormal movements (rate only patient's report): No Awareness, Dental Status Current problems with teeth and/or dentures?: No Does patient usually wear dentures?: No  CIWA:  CIWA-Ar Total: 1 COWS:  COWS Total Score: 3  Treatment Plan Summary: Daily contact with patient to assess and evaluate symptoms and progress in treatment, Medication management, Plan will continue current medication regimen for now.  and monitor pain (improving).   Medical Decision Making:  Established Problem, Stable/Improving (1), Review of Psycho-Social Stressors (1) and Review of Medication Regimen & Side Effects (2)     Nichole Gibbs 01/08/2015, 2:12 PM
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
## 2015-01-08 NOTE — Progress Notes (Signed)
Psychoeducational Group Note  Date:  01/08/2015 Time:  2120  Group Topic/Focus:  Wrap-Up Group:   The focus of this group is to help patients review their daily goal of treatment and discuss progress on daily workbooks.  Participation Level: Did Not Attend  Participation Quality:  Not Applicable  Affect:  Not Applicable  Cognitive:  Not Applicable  Insight:  Not Applicable  Engagement in Group: Not Applicable  Additional Comments:  The patient did not attend group this evening and remained asleep in her bedroom.   Archie Balboa S 01/08/2015, 9:20 PM
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
## 2015-01-09 DIAGNOSIS — F333 Major depressive disorder, recurrent, severe with psychotic symptoms: Principal | ICD-10-CM

## 2015-01-09 MED ORDER — LAMOTRIGINE 25 MG PO TABS
ORAL_TABLET | ORAL | Status: AC
  Filled 2015-01-09: qty 1

## 2015-01-09 MED ORDER — RISPERIDONE 0.5 MG PO TABS
0.5000 mg | ORAL_TABLET | Freq: Every day | ORAL | Status: DC
  Administered 2015-01-09 – 2015-01-12 (×4): 0.5 mg via ORAL
  Filled 2015-01-09 (×4): qty 1
  Filled 2015-01-09 (×2): qty 3
  Filled 2015-01-09: qty 1

## 2015-01-09 NOTE — Progress Notes (Signed)
The focus of this group is to help patients review their daily goal of treatment and discuss progress on daily workbooks. Pt attended the evening group session and responded to all discussion prompts from the [facility]. Pt shared that today was a good day on the unit, the highlight of which was exerting control over her mood and actions. Nichole Gibbs mentioned that several things happened on the unit today that might have ordinarily upset her, but that she chose not to react that way and kept her cool. Pt was given praise for this self-control. She also mentioned being distrustful of certain members of the Nursing Staff here concerning her medications, particularly new medications. The Writer encouraged the Pt to share all medications concerns with her RN and physicians. Pt reported having no additional needs from Nursing Staff this evening. Pt's affect was appropriate.

## 2015-01-09 NOTE — Progress Notes (Signed)
Patient given back brace from home, per MD order.

## 2015-01-09 NOTE — [facility] Group Notes (Signed)
[facility] Group Notes:  (Nursing/MHT/Case Management/Adjunct)  Date:  01/09/2015  Time:  2:21 PM  Type of Therapy:  Nurse Education  Participation Level:  Active  Participation Quality:  Appropriate, Sharing and Supportive  Affect:  Appropriate  Cognitive:  Alert and Appropriate  Insight:  Appropriate  Engagement in Group:  Engaged  Modes of Intervention:  Discussion, Education and Socialization  Summary of Progress/Problems: Patient actively participated in open discussion of positive coping skills.   Forrestine Him 01/09/2015, 2:21 PM

## 2015-01-09 NOTE — Progress Notes (Signed)
D: Per patient self inventory form patient reports she slept poorly last night with the use of sleep medication. She reports a good appetite, low energy level, poor concentration. She rates depression 0/10, hopelessness 0/10, anxiety 0/10- all on 0-10 scale, 10 being the worse. She denies SI/HI. Denies AVH. She c/o generalized pain. She reports her goal is to sleep better at night. Presents with paranoia. Paranoid thoughts in regards to her neighbors. Attending group on the unit. Active during group. Noted socializing with peers throughout the shift.   A:Special checks q 15 mins in place for safety. Medication administered per MD order(see eMAR). Encouragement and support provided.  R: Safety maintained. Will continue to monitor.

## 2015-01-09 NOTE — [facility] Group Notes (Signed)
[facility] Group Notes:  (Clinical Social Work)  01/09/2015  11:00-12:00PM  Summary of Progress/Problems:   The main focus of today's process group was to discuss patients' feelings related to being hospitalized, as well as the difference between "being" and "having" a mental health diagnosis.  It was agreed in general by the group that it would be preferable to avoid future hospitalizations, and we discussed means of doing that.  As a follow-up, problems with adhering to medication recommendations were discussed.  The patient expressed her primary feeling about being hospitalized is that she is safe and is getting needed help.  She spoke a lot during group, at times tangentially, but with awareness that she was doing so, tried to regulate herself.  Type of Therapy:  Group Therapy - Process  Participation Level:  Active  Participation Quality:  Appropriate, Attentive, Sharing and Supportive  Affect:  Blunted  Cognitive:  Alert and Appropriate  Insight:  Engaged  Engagement in Therapy:  Engaged  Modes of Intervention:  Exploration, Discussion  Selmer Dominion, LCSW 01/09/2015, 12:43 PM
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
## 2015-01-09 NOTE — Progress Notes (Signed)
Patient ID: Nichole Gibbs, female   DOB: 05-17-1956, 59 y.o.   MRN: [id number]  D: Patient pleasant on approach tonight. Lying down tonight. Reports not sleeping well last night and will probably get some better rest tonight. Patient aware of available medication. Reports mood better than earlier. Contracts for safety on the unit. Interacting well with her peers.  A: Staff will monitor on q 15 minute checks, follow treatment plan, and give meds as ordered. R: Cooperative on the unit.

## 2015-01-10 MED ORDER — AMLODIPINE BESYLATE 2.5 MG PO TABS
2.5000 mg | ORAL_TABLET | Freq: Every day | ORAL | Status: DC
  Administered 2015-01-11 – 2015-01-13 (×3): 2.5 mg via ORAL
  Filled 2015-01-10 (×5): qty 1

## 2015-01-10 MED ORDER — CHLORHEXIDINE GLUCONATE 0.12 % MT SOLN
15.0000 mL | Freq: Two times a day (BID) | OROMUCOSAL | Status: DC
  Administered 2015-01-10 – 2015-01-13 (×6): 15 mL via OROMUCOSAL
  Filled 2015-01-10 (×10): qty 15

## 2015-01-10 NOTE — Plan of Care (Signed)
Problem: Ineffective individual coping Goal: LTG: Patient will report a decrease in negative feelings Outcome: Progressing Pt stated she was feeling much better today  Goal: STG: Patient will remain free from self harm Outcome: Progressing Pt safe on the unit

## 2015-01-10 NOTE — [facility] Group Notes (Signed)
[facility] Group Notes:  (Nursing/MHT/Case Management/Adjunct)  Date:  01/10/2015  Time:  10:16 AM  Type of Therapy:  Psychoeducational Skills  Participation Level:  Did Not Attend  Participation Quality:  Did Not Attend  Affect:  Did Not Attend  Cognitive:  Did Not Attend  Insight:  None  Engagement in Group:  Did Not Attend  Modes of Intervention:  Did Not Attend  Summary of Progress/Problems: Pt did not attend patient self inventory group.  Benancio Deeds Shanta 01/10/2015, 10:16 AM

## 2015-01-10 NOTE — Progress Notes (Signed)
Patient ID: Nichole Gibbs, female   DOB: January 31, 1956, 59 y.o.   MRN: [id number] [facility] MD Progress Note  01/10/2015 11:46 AM Nichole Gibbs  MRN:  [id number] Subjective:   Patient states "I'm in my room because people make fun of me for having bad breath. I had some blurred vision this morning. I am just so sensitive to medications. I'm trying to get myself together."   Objective:   Pt seen and chart is reviewed. She is active and engaged during group therapy. Patient reports ongoing depressive symptoms. The patient talks less about delusions today during conversation. However, nursing staff have reported that the patient continues to talk about elaborate plans that the neighbors have made to harass her. She continues to express concerns that her daughter's father is controlling her and not allowing daughter to live with patient. Patient has no insight into psychotic symptoms. She was started on a low dose of Risperdal last night to address this. However, the patient remains very vigilant about medications questioning their purpose and potential side effects. Patient appears to be isolating in her room some due to concerns about "the bad breath that I have lost several jobs over." Yesterday the patient appeared to be more active on the unit.   Principal Problem: Severe recurrent major depressive disorder with psychotic features Diagnosis:   Patient Active Problem List   Diagnosis Date Noted  . Severe recurrent major depressive disorder with psychotic features [F33.3] 01/07/2015  . Chronic pain syndrome [G89.4] 01/07/2015  . S/P knee surgery [Z98.89] 04/03/2014  . Unspecified constipation [K59.00] 10/14/2013  . Halitosis [R19.6] 10/14/2013  . MITRAL REGURGITATION [I08.0] 04/15/2009  . HYPERTENSION, UNSPECIFIED [I10] 04/15/2009  . MITRAL VALVE PROLAPSE, HX OF [Z86.79] 04/15/2009  . DSORD BIPOLAR I, DEPRESSED WITH PSYCHOSIS [F31.9] 10/27/2006  . ANXIETY [F41.1] 10/27/2006  .  TOBACCO ABUSE [Z72.0] 10/27/2006  . GERD [K21.9] 10/27/2006  . OSTEOARTHRITIS, HANDS, BILATERAL [M19.049] 10/27/2006  . SYNDROME, ROTATOR CUFF NOS [M75.50] 10/27/2006   Total Time spent with patient: 20 minutes  Past Medical History:  Past Medical History  Diagnosis Date  . Hypertension   . Mitral valve prolapse     cardiologist-   dr Burt Knack  . Anxiety   . GERD (gastroesophageal reflux disease)   . Bipolar 1 disorder   . Osteoarthritis   . Rotator cuff syndrome     right side  . Chest pain     denies at this time 10/7  . Anemia   . Depression   . Right patella fracture   . Moderate mitral regurgitation   . Memory loss     pt states mild memory loss    Past Surgical History  Procedure Laterality Date  . Cesarean section    . Tonsillectomy  09-07-2003  . Cauterization post tonsillectomy  09-25-2003  . Anterior cervical decomp/discectomy fusion  05-19-2009    C4 --  C7  and C5 corpectomy  . Re-exploration anterior cervical wound and evacuation hematoma  05-19-2009  . Cardiovascular stress test  11-20-2012  dr cooper    normal perfusion study/  no ischemia/  ef 62%  . Transthoracic echocardiogram  01-07-2014    grade II diastolic dysfunction/  ef 55-60%/  mild late systolic mitral valve prolapse of anterior leaflet/  moderate MR  . Patellectomy Right 04/03/2014    Procedure: PATELLECTOMY;  Surgeon: Sydnee Cabal, MD;  Location: [facility];  Service: Orthopedics;  Laterality: Right;   Family History:  Family History  Problem  Relation Age of Onset  . Heart disease Mother   . Heart attack Father   . Colon polyps Father   . Skin cancer Father   . Esophageal cancer Maternal Uncle   . Other Maternal Aunt     stomcah polyps  . Bone cancer Paternal Grandfather   . Uterine cancer Paternal Grandmother   . Liver cancer Paternal Uncle   . Cirrhosis Paternal Uncle    Social History:  History  Alcohol Use  . 0.6 oz/week  . 1 Cans of beer per week     Comment: occasional beer     History  Drug Use No    History   Social History  . Marital Status: Divorced    Spouse Name: N/A  . Number of Children: 3  . Years of Education: N/A   Occupational History  . DISABILITY    Social History Main Topics  . Smoking status: Former Smoker -- 1.00 packs/day for 30 years    Types: Cigarettes  . Smokeless tobacco: Never Used     Comment: uses hooka 1 ounce/2 weeks  . Alcohol Use: 0.6 oz/week    1 Cans of beer per week     Comment: occasional beer  . Drug Use: No  . Sexual Activity: Not on file   Other Topics Concern  . None   Social History Narrative   Additional History:    Sleep: Fair  Appetite:  Fair  Musculoskeletal: Strength & Muscle Tone: abnormal Gait & Station: unsteady walks with a cane Patient leans: N/A  Psychiatric Specialty Exam: Physical Exam  Review of Systems  Constitutional: Negative.   HENT: Negative.   Eyes: Negative.   Respiratory: Negative.   Cardiovascular: Negative.   Gastrointestinal: Negative.   Genitourinary: Negative.   Musculoskeletal: Positive for back pain and joint pain.  Skin: Negative.   Neurological: Negative.   Endo/Heme/Allergies: Negative.   Psychiatric/Behavioral: Positive for depression. Negative for suicidal ideas, hallucinations, memory loss and substance abuse. The patient is nervous/anxious and has insomnia.     Blood pressure 101/49, pulse 66, temperature 98.5 F (36.9 C), temperature source Oral, resp. rate 16, height 5' 2.5" (1.588 m), weight 60.782 kg (134 lb).Body mass index is 24.1 kg/(m^2).  General Appearance: Fairly Groomed  Engineer, water::  Fair  Speech:  Normal Rate  Volume:  Normal  Mood:  Anxious  Affect:  Depressed  Thought Process:  Coherent and Goal Directed  Orientation:  Full (Time, Place, and Person)  Thought Content:  Delusions, Paranoid Ideation and Rumination  Suicidal Thoughts:  No  Homicidal Thoughts:  No  Memory:  Immediate;   Fair Recent;    Fair Remote;   Fair  Judgement:  Fair  Insight:  Fair  Psychomotor Activity:  Decreased  Concentration:  Poor  Recall:  [employer] of Knowledge:Fair  Language: Fair  Akathisia:  Negative  Handed:  Right  AIMS (if indicated):     Assets:  Communication Skills Desire for Improvement Housing Social Support Talents/Skills  ADL's:  Intact  Cognition: WNL  Sleep:  Number of Hours: 4.5   Current Medications: Current Facility-Administered Medications  Medication Dose Route Frequency Provider Last Rate Last Dose  . alum & mag hydroxide-simeth (MAALOX/MYLANTA) 200-200-20 MG/5ML suspension 30 mL  30 mL Oral Q4H PRN Laverle Hobby, PA-C   30 mL at 01/07/15 1810  . [START ON 01/11/2015] amLODipine (NORVASC) tablet 2.5 mg  2.5 mg Oral Daily Niel Hummer, NP      .  diclofenac sodium (VOLTAREN) 1 % transdermal gel 4 g  4 g Topical QID Laverle Hobby, PA-C   4 g at 01/10/15 0930  . fluticasone (FLONASE) 50 MCG/ACT nasal spray 2 spray  2 spray Each Nare Daily Laverle Hobby, PA-C   2 spray at 01/10/15 3570  . gabapentin (NEURONTIN) capsule 300 mg  300 mg Oral TID Shuvon B Rankin, NP   300 mg at 01/10/15 0920  . hydrOXYzine (ATARAX/VISTARIL) tablet 25 mg  25 mg Oral Q6H PRN Laverle Hobby, PA-C   25 mg at 01/09/15 2249  . lamoTRIgine (LAMICTAL) tablet 150 mg  150 mg Oral QHS Laverle Hobby, PA-C   150 mg at 01/09/15 2246  . losartan (COZAAR) tablet 100 mg  100 mg Oral Daily PRN Laverle Hobby, PA-C      . magnesium hydroxide (MILK OF MAGNESIA) suspension 30 mL  30 mL Oral Daily PRN Laverle Hobby, PA-C   30 mL at 01/08/15 0240  . OxyCODONE (OXYCONTIN) 12 hr tablet 10 mg  10 mg Oral Q12H Shuvon B Rankin, NP   10 mg at 01/10/15 0920  . pantoprazole (PROTONIX) EC tablet 40 mg  40 mg Oral Daily Laverle Hobby, PA-C   40 mg at 01/10/15 0858  . risperiDONE (RISPERDAL) tablet 0.5 mg  0.5 mg Oral QHS Niel Hummer, NP   0.5 mg at 01/09/15 2246  . traZODone (DESYREL) tablet 50 mg  50 mg Oral QHS,MR X  1 Laverle Hobby, PA-C   50 mg at 01/09/15 2246    Lab Results:  Results for orders placed or performed during the hospital encounter of 01/07/15 (from the past 48 hour(s))  Glucose, capillary     Status: Abnormal   Collection Time: 01/08/15  5:03 PM  Result Value Ref Range   Glucose-Capillary 114 (H) 65 - 99 mg/dL    Physical Findings: AIMS: Facial and Oral Movements Muscles of Facial Expression: None, normal Lips and Perioral Area: None, normal Jaw: None, normal Tongue: None, normal,Extremity Movements Upper (arms, wrists, hands, fingers): None, normal Lower (legs, knees, ankles, toes): None, normal, Trunk Movements Neck, shoulders, hips: None, normal, Overall Severity Severity of abnormal movements (highest score from questions above): None, normal Incapacitation due to abnormal movements: None, normal Patient's awareness of abnormal movements (rate only patient's report): No Awareness, Dental Status Current problems with teeth and/or dentures?: No Does patient usually wear dentures?: No  CIWA:  CIWA-Ar Total: 1 COWS:  COWS Total Score: 3  Treatment Plan Summary: Daily contact with patient to assess and evaluate symptoms and progress in treatment, Medication management, Plan will continue current medication regimen for now.  and monitor pain (improving).  Continue crisis management and stabilization.  Medication management:  -Continue Lamictal 150 mg at hs for mood control -Continue Neurontin 300 mg TID for neuropathic pain. -Continue Risperdal 0.5 mg hs for psychosis and to help with sleep.  Encouraged patient to attend groups and participate in group counseling sessions and activities.  Discharge plan in progress.  Continue current treatment plan.  Address health issues: Vitals reviewed and stable. Start Peridex mouth wash twice daily per patient request for improve dental hygiene. Decrease Norvasc to 2.5 daily due to hypotension and complaints of dizziness.   Medical  Decision Making:  Established Problem, Stable/Improving (1), Review of Psycho-Social Stressors (1) and Review of Medication Regimen & Side Effects (2)  DAVIS, LAURA, NP-C 01/10/2015, 11:46 AM I agree with assessment and plan Geralyn Flash A. Sabra Heck, M.D.

## 2015-01-10 NOTE — [facility] Group Notes (Signed)
[place] Group Notes:  (Clinical Social Work)  01/10/2015  [place] Group Notes:  (Clinical Social Work)  01/10/2015  11:00AM-12:00PM  Summary of Progress/Problems:  The main focus of today's process group was to listen to a variety of genres of music and to identify that different types of music provoke different responses.  The patient then was able to identify personally what was soothing for them, as well as energizing.  Handouts were used to record feelings evoked, as well as how patient can personally use this knowledge in sleep habits, with depression, and with other symptoms.  The patient expressed understanding of concepts, as well as knowledge of how each type of music affected her and how this can be used at home as a wellness/recovery tool.  She made cogent remarks about her feelings/thoughts with several songs, but did ask that nobody make fun of her when she cried, and was skeptical when CSW said we would not laugh at her.  Type of Therapy:  Music Therapy   Participation Level:  Active  Participation Quality:  Attentive and Sharing  Affect:  Blunted  Cognitive:  Oriented  Insight:  Engaged  Engagement in Therapy:  Engaged  Modes of Intervention:   Activity, Exploration  Selmer Dominion, LCSW 01/10/2015

## 2015-01-10 NOTE — Progress Notes (Signed)
D: Pt denies SI/HI/AVH. Pt is pleasant and cooperative. Pt stated she had a rough start to the morning, but got better as the day went on. Pt stated she still hasn't talked to her ex about her daughter, but wants to talk to him as soon as possible.   A: Pt was offered support and encouragement. Pt was given scheduled medications. Pt was encourage to attend groups. Q 15 minute checks were done for safety.    R:Pt attends groups and interacts well with peers and staff. Pt is taking medication. Pt has no complaints at this time .Pt receptive to treatment and safety maintained on unit.

## 2015-01-10 NOTE — Progress Notes (Signed)
D) BP 101/49, P 65.  Pt. C/o fatigue, blurred vision, general malaise.  A) Hydration encouraged.  Orthostatic position changes taught, and reviewed. NP consulted.  Norvasc held.  Discussed use of pain meds with regard to BP. R) Neurontin and Oxycodone given per NP instructions and as ordered. Will continue to monitor.

## 2015-01-10 NOTE — Progress Notes (Signed)
D: Pt denies SI/HI/AVH. Pt is pleasant and cooperative. Pt concerned that her daughter living with child 59 father and he is controlling not allowing the daughter to live her life. Pt states she feels much better , she has been socializing and not isolating. Pt stated she would like help finding Support group and would like to do Out patient therapy when she leaves.   A: Pt was offered support and encouragement. Pt was given scheduled medications. Pt was encourage to attend groups. Q 15 minute checks were done for safety.   R:Pt attends groups and interacts well with peers and staff. Pt is taking medication. Pt has no complaints at this time .Pt receptive to treatment and safety maintained on unit.

## 2015-01-10 NOTE — Plan of Care (Signed)
Problem: Ineffective individual coping Goal: LTG: Patient will report a decrease in negative feelings Outcome: Progressing Pt stated she was feeling better and hopeful   Problem: Diagnosis: Increased Risk For Suicide Attempt Goal: LTG-Patient Will Report Improvement in Psychotic Symptoms LTG (by discharge) : Patient will report improvement in psychotic symptoms.  Outcome: Progressing Pt denies AVH at this time

## 2015-01-10 NOTE — Progress Notes (Signed)
[place] Group Notes:  (Nursing/MHT/Case Management/Adjunct)  Date:  01/10/2015  Time:  9:19 PM  Type of Therapy:  Psychoeducational Skills  Participation Level:  Minimal  Participation Quality:  Attentive  Affect:  Labile  Cognitive:  Appropriate  Insight:  Improving  Engagement in Group:  Developing/Improving  Modes of Intervention:  Education  Summary of Progress/Problems: The patient mentioned that she had a good day overall and that she was pleased to "be around good people". No additional details were provided about her day. In terms of the theme for the day, her support system will be comprised of her daughter and her support groups.   Archie Balboa S 01/10/2015, 9:19 PM

## 2015-01-11 MED ORDER — METRONIDAZOLE 500 MG PO TABS
500.0000 mg | ORAL_TABLET | Freq: Two times a day (BID) | ORAL | Status: DC
  Administered 2015-01-11 – 2015-01-13 (×4): 500 mg via ORAL
  Filled 2015-01-11 (×2): qty 1
  Filled 2015-01-11: qty 10
  Filled 2015-01-11 (×2): qty 1
  Filled 2015-01-11: qty 10
  Filled 2015-01-11: qty 2
  Filled 2015-01-11: qty 10
  Filled 2015-01-11 (×2): qty 1
  Filled 2015-01-11: qty 10

## 2015-01-11 MED ORDER — MICONAZOLE NITRATE 2 % VA CREA
1.0000 | TOPICAL_CREAM | Freq: Every day | VAGINAL | Status: DC
  Administered 2015-01-11 – 2015-01-12 (×2): 1 via VAGINAL
  Filled 2015-01-11 (×2): qty 45

## 2015-01-11 MED ORDER — HYDROXYZINE HCL 25 MG PO TABS
25.0000 mg | ORAL_TABLET | Freq: Four times a day (QID) | ORAL | Status: DC | PRN
  Administered 2015-01-11 – 2015-01-12 (×3): 25 mg via ORAL
  Filled 2015-01-11 (×3): qty 1

## 2015-01-11 NOTE — [facility] Group Notes (Signed)
[facility] LCSW Aftercare Discharge Planning Group Note   01/11/2015 5:36 PM  Participation Quality:  Engaged  Mood/Affect:  Depressed  Depression Rating:  denies  Anxiety Rating:  9  Thoughts of Suicide:  No Will you contract for safety?   NA  Current AVH:  No  Plan for Discharge/Comments:  "I still need a boost.  I'm worried about my daughter.  She is with her father, and will not go to school if she stays with him.  But she needs too.  She has too much potential to waste it.  I've been crying about it."  Denies depression, but rates anxiety over this this situation very high.  "Can I go take care of some things outside of here and come back?"  Transportation Means:   Supports:  [place], Barbaraann Rondo B

## 2015-01-11 NOTE — Progress Notes (Signed)
D: Pt denies SI/HI/AVH. Pt is pleasant and cooperative. Pt stated she had an anxiety attack earlier today, pt still concerned about dream the other night.   A: Pt was offered support and encouragement. Pt was given scheduled medications. Pt was encourage to attend groups. Q 15 minute checks were done for safety.    R:Pt attends groups and interacts well with peers and staff. Pt is taking medication. Pt has no complaints.Pt receptive to treatment and safety maintained on unit.

## 2015-01-11 NOTE — Plan of Care (Signed)
Problem: Ineffective individual coping Goal: STG: Patient will remain free from self harm Outcome: Progressing Patient has remained free from self harm at this time.
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
## 2015-01-11 NOTE — Progress Notes (Signed)
The focus of this group is to help patients review their daily goal of treatment and discuss progress on daily workbooks. Pt attended the evening group session and responded to all discussion prompts from the [facility]. Pt shared that today was a good day on the unit, the highlight of which was feeling productive both in her hospital care and outside affairs. "I had a good talk with my Education officer, museum and doctor today. Outside the hospital, my daughter is taking care of a lot of things for me." Pt reported feeling ready to discharge home and continue her treatment in an outpatient setting. Nichole Gibbs also inquired about obtaining her medical record and was encouraged to speak about this with the Medical Records Department upon discharge. Pt reported having no additional needs from Nursing Staff this evening. Pt's affect was appropriate.

## 2015-01-11 NOTE — Plan of Care (Signed)
Problem: Diagnosis: Increased Risk For Suicide Attempt Goal: STG-Patient Will Attend All Groups On The Unit Outcome: Progressing Patient noted attending groups on the unit

## 2015-01-11 NOTE — Progress Notes (Signed)
Patient presents with increased anxiety. Reports she has had a change in feelings since this morning. "Everything has changed, I need to get things worked out on the outside." Reports worry in regards to her daughter. Antianxiety medication given with large cup of water. Patient encouraged to engage on the unit. She reports understanding. Will follow up on medication effectiveness.
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
## 2015-01-11 NOTE — Progress Notes (Signed)
D: Per patient self inventory form patient reports she slept good last night with the use of sleep medication. She reports a good appetite, low energy level, poor concentration. She rates depression 0/10, hopelessness 0/10, anxiety 0/10- all on 0-10 scale, 10 being the worse. She denies SI/HI. She denies AVH. She c/o generalized chronic pain 8/10. Questionable attention seeking behavior. Noted socializing with peers. Attending groups on the unit. Ambulating with cane assistance. She reports to [place] that she feels better, but "still has a long way to go." Patient reports she wants long term care  A:Special checks q 15 mins in place for safety. Medication administered per MD order (see eMAR.) Encouragement and support provided. Social worker notified of above information.  R:Safety maintained. Patient complaint with medication regimen. Reports relief from pain medication. No falls noted and/or reported so far this shift. Will continue to monitor.
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
## 2015-01-11 NOTE — Care Management Utilization Note (Signed)
   Per State Regulation 482.30  This chart was reviewed for necessity with respect to the patient's Admission/ Duration of stay.  Next review date:  01/14/15  Skipper Cliche RN, BSN
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
## 2015-01-12 NOTE — Progress Notes (Signed)
Patient ID: Nichole Gibbs, female   DOB: 03/05/1956, 59 y.o.   MRN: [id number] D: Patient denies SI/HI and auditory and visual hallucinations. Patient is anxious and paranoid.  Extremely focused on her medications and what they are prescribed for. Making lists of information regarding them. Complained of anxiety and requested medication.  A: Patient given emotional support from RN. Patient given medications per MD orders. Patient encouraged to attend groups and unit activities. Patient encouraged to come to staff with any questions or concerns.  R: Patient remains cooperative and appropriate. Will continue to monitor patient for safety.

## 2015-01-12 NOTE — BHH Group Notes (Signed)
[facility] Group Notes:  (Nursing/MHT/Case Management/Adjunct)  Date:  01/12/2015  Time:  10:23 AM  Type of Therapy:  Nurse Education  Participation Level:  Active  Participation Quality:  Appropriate  Affect:  Appropriate  Cognitive:  Alert  Insight:  Good  Engagement in Group:  Engaged  Modes of Intervention:  Discussion and Education  Summary of Progress/Problems:Progressing. Good insight.  Debbrah Alar 01/12/2015, 10:23 AM

## 2015-01-12 NOTE — Plan of Care (Signed)
Problem: Ineffective individual coping Goal: STG: Patient will remain free from self harm Outcome: Progressing Pt safe on the unit     

## 2015-01-12 NOTE — [facility]HH Group Notes (Signed)
[facility] LCSW Group Therapy  01/12/2015 1:15 pm  Type of Therapy: Process Group Therapy  Participation Level:  Active  Participation Quality:  Appropriate  Affect:  Flat  Cognitive:  Oriented  Insight:  Improving  Engagement in Group:  Limited  Engagement in Therapy:  Limited  Modes of Intervention:  Activity, Clarification, Education, Problem-solving and Support  Summary of Progress/Problems: Today's group addressed the issue of overcoming obstacles.  Patients were asked to identify their biggest obstacle post d/c that stands in the way of their on-going success, and then problem solve as to how to manage this.  Nichole Gibbs took one break, but returned a short time later.  Minimal interaction, but responded when questioned directly.  Talked about loneliness and accompanying despondence.  But brightened considerably when talking about cooking with her daughter. She rewards herself by taking long hot baths.  Nichole Gibbs 01/12/2015   3:07 PM

## 2015-01-12 NOTE — Progress Notes (Signed)
The focus of this group is to help patients review their daily goal of treatment and discuss progress on daily workbooks. Pt attended the evening group session and responded to all discussion prompts from the [place]. Pt shared that today was a generally good day on the unit for no reason in particular. Pt did reference having several good conversations with her daughter on the phone. "She's been a big help to me while I've been in here." Pt reported having no additional needs from Nursing Staff this evening beyond prayer. Nichole Gibbs mentioned her future goals as finding a way to achieve happiness and freedom. Pt's affect was appropriate.

## 2015-01-13 DIAGNOSIS — F332 Major depressive disorder, recurrent severe without psychotic features: Secondary | ICD-10-CM | POA: Insufficient documentation

## 2015-01-13 MED ORDER — HYDROXYZINE HCL 50 MG PO TABS
50.0000 mg | ORAL_TABLET | Freq: Every evening | ORAL | Status: DC | PRN
  Administered 2015-01-13: 50 mg via ORAL
  Filled 2015-01-13: qty 1
  Filled 2015-01-13 (×3): qty 6
  Filled 2015-01-13 (×2): qty 1
  Filled 2015-01-13: qty 6
  Filled 2015-01-13: qty 1

## 2015-01-13 MED ORDER — LOSARTAN POTASSIUM 100 MG PO TABS: 100.0000 mg | ORAL_TABLET | Freq: Every day | ORAL | Status: DC | PRN

## 2015-01-13 MED ORDER — PANTOPRAZOLE SODIUM 40 MG PO TBEC: 40.0000 mg | DELAYED_RELEASE_TABLET | Freq: Every day | ORAL | Status: DC

## 2015-01-13 MED ORDER — RISPERIDONE 0.5 MG PO TABS: 0.5000 mg | ORAL_TABLET | Freq: Every day | ORAL | Status: DC

## 2015-01-13 MED ORDER — METRONIDAZOLE 500 MG PO TABS: 500.0000 mg | ORAL_TABLET | Freq: Two times a day (BID) | ORAL | Status: DC

## 2015-01-13 MED ORDER — LAMOTRIGINE 150 MG PO TABS: 150.0000 mg | ORAL_TABLET | Freq: Every day | ORAL | Status: DC

## 2015-01-13 MED ORDER — AMLODIPINE BESYLATE 2.5 MG PO TABS: 2.5000 mg | ORAL_TABLET | Freq: Every day | ORAL | Status: DC

## 2015-01-13 MED ORDER — GABAPENTIN 300 MG PO CAPS: 300.0000 mg | ORAL_CAPSULE | Freq: Three times a day (TID) | ORAL | Status: DC

## 2015-01-13 MED ORDER — HYDROXYZINE HCL 50 MG PO TABS: 50.0000 mg | ORAL_TABLET | Freq: Every evening | ORAL | Status: DC | PRN

## 2015-01-13 NOTE — Clinical Social Work Note (Signed)
At pt's request, called ex with her yesterday re: ride home.  He stated he is on fixed income, has been coming to [place] from [place] daily to take care of housing issues related to pt, and is unable to come tomorrow.  I stated that we could help with travel on our end, but pt was insistent and things quickly went down hill from there.  She accused him of abandonment, and he accused her of drinking alcohol regularly, demonstrating paranoia and cussing him out, all of which she denied, and made her own list of complaints against him.  Ultimately, her daughter spoke up who was in the background and stated she would come pick up pt and stay with her at her house.  Pt has no other place to go but with ex, and he is clear he is not willing to try that again.
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
## 2015-01-13 NOTE — Progress Notes (Signed)
  [facility] Adult Case Management Discharge Plan :  Will you be returning to the same living situation after discharge:  Yes,  home At discharge, do you have transportation home?: Yes,  daughter Do you have the ability to pay for your medications: Yes,  insurance  Release of information consent forms completed and in the chart;  Patient's signature needed at discharge.  Patient to Follow up at: Follow-up Information    Follow up with [facility] On 03/10/2015.   Why:  Medications management on 03/10/15 at 3 PM w Lattie Haw.   Contact information:   [address],  [place], [place] [address] Phone: [phone] Fax: [phone]       Follow up with [facility] on 01/22/2015.   Why:  Appt scheduled w Celesta Gentile for 10 AM on Friday 01/22/15 - please call provider to cancel or reschedule   Contact information:   [address],  [place], [place] [address] Phone: [phone] Fax: [phone]        Patient denies SI/HI: Yes,  yes    Safety Planning and Suicide Prevention discussed: Yes,  yes  Have you used any form of tobacco in the last 30 days? (Cigarettes, Smokeless Tobacco, Cigars, and/or Pipes): No  Has patient been referred to the Quitline?: Yes, faxed on 01/13/15  Trish Mage 01/13/2015, 1:36 PM

## 2015-01-13 NOTE — Plan of Care (Signed)
Problem: Ineffective individual coping Goal: LTG: Patient will report a decrease in negative feelings Outcome: Progressing Pt was feeling better today, and pt stated she was feeling more hopeful about the future.   Problem: Diagnosis: Increased Risk For Suicide Attempt Goal: LTG-Patient Will Report Improvement in Psychotic Symptoms LTG (by discharge) : Patient will report improvement in psychotic symptoms.  Outcome: Progressing Pt denies AVH at this time

## 2015-01-13 NOTE — Progress Notes (Signed)
Patient ID: Nichole Gibbs, female   DOB: 04-11-1956, 59 y.o.   MRN: [id number] D: Patient denies SI/HI and auditory and visual hallucinations. Patient less anxious today. Less focused on medications. Anticipating discharge today.  A: Patient given emotional support from RN. Patient given medications per MD orders.   R: Patient remains cooperative and appropriate. Will continue to monitor patient for safety.
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
## 2015-01-13 NOTE — Tx Team (Signed)
Interdisciplinary Treatment Plan Update (Adult)  Date:  01/13/2015   Time Reviewed:  12:08 PM   Progress in Treatment: Attending groups: Yes. Participating in groups:  Yes. Taking medication as prescribed:  Yes. Tolerating medication:  Yes. Family/Significant othe contact made:  Yes Patient understands diagnosis:  Yes   Discussing patient identified problems/goals with staff:  Yes, see initial care plan. Medical problems stabilized or resolved:  Yes. Denies suicidal/homicidal ideation: Yes. Issues/concerns per patient self-inventory:  No. Other:  New problem(s) identified:  Discharge Plan or Barriers: return home, follow up outpt  Reason for Continuation of Hospitalization:   Comments:  Estimated length of stay: D/C today  New goal(s):  Review of initial/current patient goals per problem list:     Attendees: Patient:  01/13/2015 12:08 PM   Family:   01/13/2015 12:08 PM   Physician:  Michiel Sites, MD 01/13/2015 12:08 PM   Nursing:   Eben Burow, RN 01/13/2015 12:08 PM   CSW:    Roque Lias, LCSW   01/13/2015 12:08 PM   Other:  01/13/2015 12:08 PM   Other:   01/13/2015 12:08 PM   Other:  Lars Pinks, Nurse CM 01/13/2015 12:08 PM   Other:  Lucinda Dell, Beverly Sessions TCT 01/13/2015 12:08 PM   Other:  Norberto Sorenson, [place]  01/13/2015 12:08 PM   Other:  01/13/2015 12:08 PM   Other:  01/13/2015 12:08 PM   Other:  01/13/2015 12:08 PM   Other:  01/13/2015 12:08 PM   Other:  01/13/2015 12:08 PM   Other:   01/13/2015 12:08 PM    Scribe for Treatment Team:   Trish Mage, 01/13/2015 12:08 PM

## 2015-01-13 NOTE — Progress Notes (Signed)
D: Pt denies SI/HI/AVH. Pt is pleasant and cooperative. Pt stated she felt pretty good today. Pt stated she felt she was not ready to leave yet.   A: Pt was offered support and encouragement. Pt was given scheduled medications. Pt was encourage to attend groups. Q 15 minute checks were done for safety.   R:Pt attends groups and interacts well with peers and staff. Pt is taking medication. Pt has no complaints at this time .Pt receptive to treatment and safety maintained on unit.

## 2015-01-18 ENCOUNTER — Other Ambulatory Visit (HOSPITAL_COMMUNITY): Payer: Self-pay | Admitting: Psychiatry

## 2015-01-19 DIAGNOSIS — G8929 Other chronic pain: Secondary | ICD-10-CM | POA: Diagnosis not present

## 2015-01-19 DIAGNOSIS — F329 Major depressive disorder, single episode, unspecified: Secondary | ICD-10-CM | POA: Diagnosis not present

## 2015-01-19 DIAGNOSIS — R196 Halitosis: Secondary | ICD-10-CM | POA: Diagnosis not present

## 2015-01-22 DIAGNOSIS — F319 Bipolar disorder, unspecified: Secondary | ICD-10-CM | POA: Diagnosis not present

## 2015-02-10 DIAGNOSIS — M545 Low back pain: Secondary | ICD-10-CM | POA: Diagnosis not present

## 2015-02-10 DIAGNOSIS — G894 Chronic pain syndrome: Secondary | ICD-10-CM | POA: Diagnosis not present

## 2015-02-10 DIAGNOSIS — Z79899 Other long term (current) drug therapy: Secondary | ICD-10-CM | POA: Diagnosis not present

## 2015-02-10 DIAGNOSIS — M25569 Pain in unspecified knee: Secondary | ICD-10-CM | POA: Diagnosis not present

## 2015-02-12 DIAGNOSIS — F319 Bipolar disorder, unspecified: Secondary | ICD-10-CM | POA: Diagnosis not present

## 2015-02-15 ENCOUNTER — Other Ambulatory Visit (HOSPITAL_COMMUNITY): Payer: Self-pay | Admitting: Psychiatry

## 2015-03-04 DIAGNOSIS — M25561 Pain in right knee: Secondary | ICD-10-CM | POA: Diagnosis not present

## 2015-03-04 DIAGNOSIS — M25562 Pain in left knee: Secondary | ICD-10-CM | POA: Diagnosis not present

## 2015-03-17 DIAGNOSIS — G894 Chronic pain syndrome: Secondary | ICD-10-CM | POA: Diagnosis not present

## 2015-03-17 DIAGNOSIS — M25569 Pain in unspecified knee: Secondary | ICD-10-CM | POA: Diagnosis not present

## 2015-03-17 DIAGNOSIS — F319 Bipolar disorder, unspecified: Secondary | ICD-10-CM | POA: Diagnosis not present

## 2015-03-17 DIAGNOSIS — M545 Low back pain: Secondary | ICD-10-CM | POA: Diagnosis not present

## 2015-03-17 DIAGNOSIS — Z79899 Other long term (current) drug therapy: Secondary | ICD-10-CM | POA: Diagnosis not present

## 2015-03-17 DIAGNOSIS — F333 Major depressive disorder, recurrent, severe with psychotic symptoms: Secondary | ICD-10-CM | POA: Diagnosis not present

## 2015-04-12 DIAGNOSIS — M5137 Other intervertebral disc degeneration, lumbosacral region: Secondary | ICD-10-CM | POA: Diagnosis not present

## 2015-04-12 DIAGNOSIS — M47817 Spondylosis without myelopathy or radiculopathy, lumbosacral region: Secondary | ICD-10-CM | POA: Diagnosis not present

## 2015-04-12 DIAGNOSIS — M4696 Unspecified inflammatory spondylopathy, lumbar region: Secondary | ICD-10-CM | POA: Diagnosis not present

## 2015-04-12 DIAGNOSIS — Z79899 Other long term (current) drug therapy: Secondary | ICD-10-CM | POA: Diagnosis not present

## 2015-04-12 DIAGNOSIS — G894 Chronic pain syndrome: Secondary | ICD-10-CM | POA: Diagnosis not present

## 2015-04-15 DIAGNOSIS — F319 Bipolar disorder, unspecified: Secondary | ICD-10-CM | POA: Diagnosis not present

## 2015-04-21 ENCOUNTER — Encounter (HOSPITAL_COMMUNITY): Payer: Self-pay | Admitting: Emergency Medicine

## 2015-04-21 ENCOUNTER — Emergency Department (HOSPITAL_COMMUNITY)
Admission: EM | Admit: 2015-04-21 | Discharge: 2015-04-23 | Disposition: A | Discharge status: Other Acute Inpatient Hospital | Payer: Medicare Other | Attending: Emergency Medicine | Admitting: Emergency Medicine

## 2015-04-21 ENCOUNTER — Ambulatory Visit (HOSPITAL_COMMUNITY)
Admission: RE | Admit: 2015-04-21 | Discharge: 2015-04-21 | Disposition: A | Discharge status: Home / Self Care | Payer: Medicare Other | Attending: Psychiatry | Admitting: Psychiatry

## 2015-04-21 DIAGNOSIS — Z79899 Other long term (current) drug therapy: Secondary | ICD-10-CM | POA: Insufficient documentation

## 2015-04-21 DIAGNOSIS — Z87891 Personal history of nicotine dependence: Secondary | ICD-10-CM | POA: Insufficient documentation

## 2015-04-21 DIAGNOSIS — F600 Paranoid personality disorder: Secondary | ICD-10-CM | POA: Diagnosis not present

## 2015-04-21 DIAGNOSIS — I10 Essential (primary) hypertension: Secondary | ICD-10-CM | POA: Diagnosis not present

## 2015-04-21 DIAGNOSIS — Z862 Personal history of diseases of the blood and blood-forming organs and certain disorders involving the immune mechanism: Secondary | ICD-10-CM | POA: Insufficient documentation

## 2015-04-21 DIAGNOSIS — F1310 Sedative, hypnotic or anxiolytic abuse, uncomplicated: Secondary | ICD-10-CM | POA: Diagnosis not present

## 2015-04-21 DIAGNOSIS — K219 Gastro-esophageal reflux disease without esophagitis: Secondary | ICD-10-CM | POA: Insufficient documentation

## 2015-04-21 DIAGNOSIS — Z8739 Personal history of other diseases of the musculoskeletal system and connective tissue: Secondary | ICD-10-CM | POA: Insufficient documentation

## 2015-04-21 DIAGNOSIS — Z8781 Personal history of (healed) traumatic fracture: Secondary | ICD-10-CM | POA: Insufficient documentation

## 2015-04-21 DIAGNOSIS — F419 Anxiety disorder, unspecified: Secondary | ICD-10-CM | POA: Diagnosis not present

## 2015-04-21 DIAGNOSIS — F1110 Opioid abuse, uncomplicated: Secondary | ICD-10-CM | POA: Insufficient documentation

## 2015-04-21 DIAGNOSIS — F3130 Bipolar disorder, current episode depressed, mild or moderate severity, unspecified: Secondary | ICD-10-CM | POA: Diagnosis not present

## 2015-04-21 DIAGNOSIS — F333 Major depressive disorder, recurrent, severe with psychotic symptoms: Secondary | ICD-10-CM | POA: Diagnosis not present

## 2015-04-21 DIAGNOSIS — F22 Delusional disorders: Secondary | ICD-10-CM

## 2015-04-21 DIAGNOSIS — Z008 Encounter for other general examination: Secondary | ICD-10-CM | POA: Diagnosis present

## 2015-04-21 LAB — COMPREHENSIVE METABOLIC PANEL
ALK PHOS: 61 U/L (ref 38–126)
ALT: 16 U/L (ref 14–54)
ANION GAP: 7 (ref 5–15)
AST: 23 U/L (ref 15–41)
Albumin: 4.9 g/dL (ref 3.5–5.0)
BILIRUBIN TOTAL: 0.5 mg/dL (ref 0.3–1.2)
BUN: 10 mg/dL (ref 6–20)
CO2: 28 mmol/L (ref 22–32)
Calcium: 9.7 mg/dL (ref 8.9–10.3)
Chloride: 105 mmol/L (ref 101–111)
Creatinine, Ser: 0.81 mg/dL (ref 0.44–1.00)
GFR calc Af Amer: >60 mL/min (ref >60)
GLUCOSE: 87 mg/dL (ref 65–99)
Potassium: 4.2 mmol/L (ref 3.5–5.1)
Sodium: 140 mmol/L (ref 135–145)
TOTAL PROTEIN: 7.8 g/dL (ref 6.5–8.1)

## 2015-04-21 LAB — CBC
HEMATOCRIT: 37.5 % (ref 36.0–46.0)
Hemoglobin: 12.4 g/dL (ref 12.0–15.0)
MCH: 29.5 pg (ref 26.0–34.0)
MCHC: 33.1 g/dL (ref 30.0–36.0)
MCV: 89.1 fL (ref 78.0–100.0)
Platelets: 263 10*3/uL (ref 150–400)
RBC: 4.21 MIL/uL (ref 3.87–5.11)
RDW: 15.2 % (ref 11.5–15.5)
WBC: 5.2 10*3/uL (ref 4.0–10.5)

## 2015-04-21 LAB — SALICYLATE LEVEL: Salicylate Lvl: <4 mg/dL (ref 2.8–30.0)

## 2015-04-21 LAB — ETHANOL: Alcohol, Ethyl (B): <5 mg/dL (ref <5)

## 2015-04-21 LAB — ACETAMINOPHEN LEVEL: Acetaminophen (Tylenol), Serum: <10 ug/mL — ABNORMAL LOW (ref 10–30)

## 2015-04-21 MED ORDER — LAMOTRIGINE 150 MG PO TABS
150.0000 mg | ORAL_TABLET | Freq: Every day | ORAL | Status: DC
  Administered 2015-04-21 – 2015-04-22 (×2): 150 mg via ORAL
  Filled 2015-04-21 (×5): qty 1

## 2015-04-21 MED ORDER — GABAPENTIN 300 MG PO CAPS
300.0000 mg | ORAL_CAPSULE | Freq: Three times a day (TID) | ORAL | Status: DC
  Administered 2015-04-21 – 2015-04-23 (×3): 300 mg via ORAL
  Filled 2015-04-21 (×3): qty 1

## 2015-04-21 MED ORDER — HYDROXYZINE HCL 25 MG PO TABS
50.0000 mg | ORAL_TABLET | Freq: Every evening | ORAL | Status: DC | PRN
  Administered 2015-04-21 – 2015-04-22 (×2): 50 mg via ORAL
  Filled 2015-04-21 (×2): qty 2

## 2015-04-21 MED ORDER — LOSARTAN POTASSIUM 50 MG PO TABS
100.0000 mg | ORAL_TABLET | Freq: Every day | ORAL | Status: DC | PRN
  Administered 2015-04-21: 100 mg via ORAL
  Filled 2015-04-21: qty 2

## 2015-04-21 MED ORDER — ONDANSETRON HCL 4 MG PO TABS: 4.0000 mg | ORAL_TABLET | Freq: Three times a day (TID) | ORAL | Status: DC | PRN

## 2015-04-21 MED ORDER — ACETAMINOPHEN 325 MG PO TABS: 650.0000 mg | ORAL_TABLET | ORAL | Status: DC | PRN

## 2015-04-21 MED ORDER — ZOLPIDEM TARTRATE 5 MG PO TABS: 5.0000 mg | ORAL_TABLET | Freq: Every evening | ORAL | Status: DC | PRN

## 2015-04-21 MED ORDER — AMLODIPINE BESYLATE 2.5 MG PO TABS
2.5000 mg | ORAL_TABLET | Freq: Every day | ORAL | Status: DC
  Administered 2015-04-21 – 2015-04-23 (×2): 2.5 mg via ORAL
  Filled 2015-04-21 (×5): qty 1

## 2015-04-21 MED ORDER — RISPERIDONE 0.5 MG PO TABS
0.5000 mg | ORAL_TABLET | Freq: Every day | ORAL | Status: DC
  Administered 2015-04-21 – 2015-04-22 (×2): 0.5 mg via ORAL
  Filled 2015-04-21 (×2): qty 1

## 2015-04-21 MED ORDER — PANTOPRAZOLE SODIUM 40 MG PO TBEC
40.0000 mg | DELAYED_RELEASE_TABLET | Freq: Every day | ORAL | Status: DC
  Administered 2015-04-23: 40 mg via ORAL
  Filled 2015-04-21 (×2): qty 1

## 2015-04-21 MED ORDER — LORAZEPAM 1 MG PO TABS
1.0000 mg | ORAL_TABLET | Freq: Three times a day (TID) | ORAL | Status: DC | PRN
  Administered 2015-04-22 (×2): 1 mg via ORAL
  Filled 2015-04-21 (×3): qty 1

## 2015-04-21 MED ORDER — NICOTINE 21 MG/24HR TD PT24
21.0000 mg | MEDICATED_PATCH | Freq: Every day | TRANSDERMAL | Status: DC
  Administered 2015-04-23: 21 mg via TRANSDERMAL
  Filled 2015-04-21 (×2): qty 1
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
## 2015-04-21 NOTE — ED Notes (Signed)
Pt states she came from [facility], was told they did not have a bed for her and to come here.
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
## 2015-04-22 DIAGNOSIS — F333 Major depressive disorder, recurrent, severe with psychotic symptoms: Secondary | ICD-10-CM

## 2015-04-22 DIAGNOSIS — F22 Delusional disorders: Secondary | ICD-10-CM | POA: Diagnosis not present

## 2015-04-22 LAB — RAPID URINE DRUG SCREEN, HOSP PERFORMED
Amphetamines: NOT DETECTED
BENZODIAZEPINES: POSITIVE — AB
Barbiturates: NOT DETECTED
Cocaine: NOT DETECTED
Opiates: POSITIVE — AB
Tetrahydrocannabinol: NOT DETECTED

## 2015-04-22 NOTE — BH Assessment (Signed)
Assessment Note  Nichole Gibbs is an 59 y.o. female who presented to [facility] as a walk in with suicidal ideations.    Patient discussed a diagnosis of Bipolar disorder and a 7 year history of psychotropic medication and outpatient therapy at [facility].  Patient was admitted to inpatient treatment this past July at  [facility]  after an attempted overdose.  Patient was tearful as she discussed feeling suicidal at that time and and taking pills.  She reported that she had changed her mind and let her daughter know and she was taken to the ED.  She stated that she felt ashamed to have to discuss this past attempt today.     Patient discussed sadness, crying, anxiety, panic, anger, irritability and problems with sleep. She denied any homicidal ideations, hallucinations, legal problems and drug use.  She reported that she will drink a few beers every couple of days when her daughter goes to stay with her father.    Patient stated that she did not want to hurt her daughter by committing suicide but that she felt like she was getting to the place where she did not want to be here anymore. She had the one inpatient treatment at [facility] in July and has been attending outpatient services with the same therapist for the past 7 years.  When she was released from [facility] in July she began to have nightmares that scared her and she attributed these nightmares to a new medication that she had been given for sleep.  She could not provide the name of the medication but stated that she stopped taking it two weeks after she left the hospital.  Patient stated that she did not feel safe going home today.   Consulted with Heloise Purpura who recommended inpatient after being medically cleared.     Diagnosis: 296.52 Bipolar I disorder, Current or most recent episode depressed, Moderate   Past Medical History:  Past Medical History  Diagnosis Date  . Hypertension   . Mitral valve prolapse     cardiologist-   dr Burt Knack  . Anxiety    . GERD (gastroesophageal reflux disease)   . Bipolar 1 disorder ([place])   . Osteoarthritis   . Rotator cuff syndrome     right side  . Chest pain     denies at this time 10/7  . Anemia   . Depression   . Right patella fracture   . Moderate mitral regurgitation   . Memory loss     pt states mild memory loss    Past Surgical History  Procedure Laterality Date  . Cesarean section    . Tonsillectomy  09-07-2003  . Cauterization post tonsillectomy  09-25-2003  . Anterior cervical decomp/discectomy fusion  05-19-2009    C4 --  C7  and C5 corpectomy  . Re-exploration anterior cervical wound and evacuation hematoma  05-19-2009  . Cardiovascular stress test  11-20-2012  dr cooper    normal perfusion study/  no ischemia/  ef 62%  . Transthoracic echocardiogram  01-07-2014    grade II diastolic dysfunction/  ef 55-60%/  mild late systolic mitral valve prolapse of anterior leaflet/  moderate MR  . Patellectomy Right 04/03/2014    Procedure: PATELLECTOMY;  Surgeon: Sydnee Cabal, MD;  Location: [facility];  Service: Orthopedics;  Laterality: Right;    Family History:  Family History  Problem Relation Age of Onset  . Heart disease Mother   . Heart attack Father   . Colon  polyps Father   . Skin cancer Father   . Esophageal cancer Maternal Uncle   . Other Maternal Aunt     stomcah polyps  . Bone cancer Paternal Grandfather   . Uterine cancer Paternal Grandmother   . Liver cancer Paternal Uncle   . Cirrhosis Paternal Uncle     Social History:  reports that she has quit smoking. Her smoking use included Cigarettes. She has a 30 pack-year smoking history. She has never used smokeless tobacco. She reports that she drinks about 0.6 oz of alcohol per week. She reports that she does not use illicit drugs.  Additional Social History:  Alcohol / Drug Use Pain Medications:  (see medical chart) Prescriptions:  (see medical chart) Over the Counter:  (see medical  chart) History of alcohol / drug use?: Yes Longest period of sobriety (when/how long):  (2 months) Negative Consequences of Use:  (states none) Withdrawal Symptoms:  (states none) Substance #1 Name of Substance 1:  (alcohol/beer) 1 - Age of First Use:  (in her teens) 1 - Amount (size/oz):  (couple of beers a few times a week) 1 - Duration:  (adult life) 1 - Last Use / Amount:  (couple of days ago)  CIWA: CIWA-Ar BP: (!) 92/49 mmHg Pulse Rate: 63 COWS:    Allergies:  Allergies  Allergen Reactions  . Tizanidine     Pt felt paranoid, homicidal, sweating, could not sleep   . Acetaminophen     Upsets stomach   . Ibuprofen Other (See Comments)    Pt states it causes stomach pains d/t hx of ulcers  . Lactose Intolerance (Gi) Other (See Comments)    Gas/Bloating/upset stomach    Home Medications:  (Not in a hospital admission)  OB/GYN Status:  No LMP recorded. Patient is postmenopausal.  General Assessment Data Admission Status: Voluntary           Risk to self with the past 6 months Is patient at risk for suicide?: No, but patient needs Medical Clearance Substance abuse history and/or treatment for substance abuse?: No        Mental Status Report Motor Activity: Unremarkable                            Advance Directives (For Healthcare) Does patient have an advance directive?: No Would patient like information on creating an advanced directive?: No - patient declined information          Disposition:     On Site Evaluation by:   Reviewed with Physician:    Carlean Jews 04/22/2015 4:14 PM

## 2015-04-22 NOTE — Progress Notes (Signed)
ED CM contacted [facility] at [phone] confirming pt pcp as candace smith

## 2015-04-22 NOTE — ED Notes (Signed)
Patient reports frustrations with some of current medications.  Patient requesting clonazepam.  States she takes this every night.  Patient also states she takes hydrocodone daily and is requesting this medication for the morning.  Explained that this will have to be done by psychiatry.  Patient voiced understanding.

## 2015-04-22 NOTE — ED Notes (Signed)
Akintayo, MD, made aware that patient wants hydrocodone 10 mg prescription and pt refuses to take her other medication until she receives hydrocodone.

## 2015-04-22 NOTE — Consult Note (Signed)
[place] Psychiatry Consult   Reason for Consult:  Psychosis  Referring Physician:  EDP Patient Identification: Nichole Gibbs MRN:  [id number] Principal Diagnosis: Severe recurrent major depressive disorder with psychotic features [facility]) Diagnosis:   Patient Active Problem List   Diagnosis Date Noted  . Severe recurrent major depressive disorder with psychotic features ([place]) [F33.3] 01/07/2015    Priority: High  . Major depressive disorder, recurrent, severe without psychotic features ([place]) [F33.2]   . Chronic pain syndrome [G89.4] 01/07/2015  . S/P knee surgery [Z98.890] 04/03/2014  . Unspecified constipation [K59.00] 10/14/2013  . Halitosis [R19.6] 10/14/2013  . MITRAL REGURGITATION [I08.0] 04/15/2009  . HYPERTENSION, UNSPECIFIED [I10] 04/15/2009  . MITRAL VALVE PROLAPSE, HX OF [Z86.79] 04/15/2009  . DSORD BIPOLAR I, DEPRESSED WITH PSYCHOSIS [F31.9] 10/27/2006  . ANXIETY [F41.1] 10/27/2006  . TOBACCO ABUSE [F17.200] 10/27/2006  . GERD [K21.9] 10/27/2006  . OSTEOARTHRITIS, HANDS, BILATERAL [M19.049] 10/27/2006  . SYNDROME, ROTATOR CUFF NOS [M71.9, V76.160] 10/27/2006    Total Time spent with patient: 45 minutes  Subjective:   Nichole Gibbs is a 59 y.o. female patient admitted with depression with psychosis.  HPI:   59 y.o. female who presented to [facility] as a walk in with suicidal ideations.   Patient discussed a diagnosis of Bipolar disorder and a 7 year history of psychotropic medication and outpatient therapy at [facility]. Patient was admitted to inpatient treatment this past July at [facility] after an attempted overdose. Patient was tearful as she discussed feeling suicidal at that time and and taking pills. She reported that she had changed her mind and let her daughter know and she was taken to the ED. She stated that she felt ashamed to have to discuss this past attempt today.   Patient discussed sadness, crying, anxiety, panic, anger,  irritability and problems with sleep. She denied any homicidal ideations, hallucinations, legal problems and drug use. She reported that she will drink a few beers every couple of days when her daughter goes to stay with her father.   Patient stated that she did not want to hurt her daughter by committing suicide but that she felt like she was getting to the place where she did not want to be here anymore. She had the one inpatient treatment at [facility] in July and has been attending outpatient services with the same therapist for the past 7 years. When she was released from [facility] in July she began to have nightmares that scared her and she attributed these nightmares to a new medication that she had been given for sleep. She could not provide the name of the medication but stated that she stopped taking it two weeks after she left the hospital. Patient stated that she did not feel safe going home today.   Today:  Patient continues to have paranoia with suicidal ideations, inpatient hospitalization will be needed.  Past Psychiatric History: depression  Risk to Self: Is patient at risk for suicide?: No, but patient needs Medical Clearance Risk to Others:   Prior Inpatient Therapy:   Prior Outpatient Therapy:    Past Medical History:  Past Medical History  Diagnosis Date  . Hypertension   . Mitral valve prolapse     cardiologist-   dr Burt Knack  . Anxiety   . GERD (gastroesophageal reflux disease)   . Bipolar 1 disorder ([place])   . Osteoarthritis   . Rotator cuff syndrome     right side  . Chest pain     denies at this  time 10/7  . Anemia   . Depression   . Right patella fracture   . Moderate mitral regurgitation   . Memory loss     pt states mild memory loss    Past Surgical History  Procedure Laterality Date  . Cesarean section    . Tonsillectomy  09-07-2003  . Cauterization post tonsillectomy  09-25-2003  . Anterior cervical decomp/discectomy fusion  05-19-2009    C4 --  C7  and C5  corpectomy  . Re-exploration anterior cervical wound and evacuation hematoma  05-19-2009  . Cardiovascular stress test  11-20-2012  dr cooper    normal perfusion study/  no ischemia/  ef 62%  . Transthoracic echocardiogram  01-07-2014    grade II diastolic dysfunction/  ef 55-60%/  mild late systolic mitral valve prolapse of anterior leaflet/  moderate MR  . Patellectomy Right 04/03/2014    Procedure: PATELLECTOMY;  Surgeon: Sydnee Cabal, MD;  Location: [facility];  Service: Orthopedics;  Laterality: Right;   Family History:  Family History  Problem Relation Age of Onset  . Heart disease Mother   . Heart attack Father   . Colon polyps Father   . Skin cancer Father   . Esophageal cancer Maternal Uncle   . Other Maternal Aunt     stomcah polyps  . Bone cancer Paternal Grandfather   . Uterine cancer Paternal Grandmother   . Liver cancer Paternal Uncle   . Cirrhosis Paternal Uncle    Family Psychiatric  History: depression  Social History:  History  Alcohol Use  . 0.6 oz/week  . 1 Cans of beer per week    Comment: occasional beer     History  Drug Use No    Social History   Social History  . Marital Status: Divorced    Spouse Name: N/A  . Number of Children: 3  . Years of Education: N/A   Occupational History  . DISABILITY    Social History Main Topics  . Smoking status: Former Smoker -- 1.00 packs/day for 30 years    Types: Cigarettes  . Smokeless tobacco: Never Used     Comment: uses hooka 1 ounce/2 weeks  . Alcohol Use: 0.6 oz/week    1 Cans of beer per week     Comment: occasional beer  . Drug Use: No  . Sexual Activity: Not Asked   Other Topics Concern  . None   Social History Narrative   Additional Social History:    Pain Medications:  (see medical chart) Prescriptions:  (see medical chart) Over the Counter:  (see medical chart) History of alcohol / drug use?: Yes Longest period of sobriety (when/how long):  (2 months) Negative  Consequences of Use:  (states none) Withdrawal Symptoms:  (states none) Name of Substance 1:  (alcohol/beer) 1 - Age of First Use:  (in her teens) 1 - Amount (size/oz):  (couple of beers a few times a week) 1 - Duration:  (adult life) 1 - Last Use / Amount:  (couple of days ago)                   Allergies:   Allergies  Allergen Reactions  . Tizanidine     Pt felt paranoid, homicidal, sweating, could not sleep   . Acetaminophen     Upsets stomach   . Ibuprofen Other (See Comments)    Pt states it causes stomach pains d/t hx of ulcers  . Lactose Intolerance (Gi) Other (See Comments)  Gas/Bloating/upset stomach    Labs:  Results for orders placed or performed during the hospital encounter of 04/21/15 (from the past 48 hour(s))  Comprehensive metabolic panel     Status: None   Collection Time: 04/21/15  6:55 PM  Result Value Ref Range   Sodium 140 135 - 145 mmol/L   Potassium 4.2 3.5 - 5.1 mmol/L   Chloride 105 101 - 111 mmol/L   CO2 28 22 - 32 mmol/L   Glucose, Bld 87 65 - 99 mg/dL   BUN 10 6 - 20 mg/dL   Creatinine, Ser 0.81 0.44 - 1.00 mg/dL   Calcium 9.7 8.9 - 10.3 mg/dL   Total Protein 7.8 6.5 - 8.1 g/dL   Albumin 4.9 3.5 - 5.0 g/dL   AST 23 15 - 41 U/L   ALT 16 14 - 54 U/L   Alkaline Phosphatase 61 38 - 126 U/L   Total Bilirubin 0.5 0.3 - 1.2 mg/dL   GFR calc non Af Amer >60 >60 mL/min   GFR calc Af Amer >60 >60 mL/min    Comment: (NOTE) The eGFR has been calculated using the CKD EPI equation. This calculation has not been validated in all clinical situations. eGFR's persistently <60 mL/min signify possible Chronic Kidney Disease.    Anion gap 7 5 - 15  Ethanol (ETOH)     Status: None   Collection Time: 04/21/15  6:55 PM  Result Value Ref Range   Alcohol, Ethyl (B) <5 <5 mg/dL    Comment:        LOWEST DETECTABLE LIMIT FOR SERUM ALCOHOL IS 5 mg/dL FOR MEDICAL PURPOSES ONLY   Salicylate level     Status: None   Collection Time: 04/21/15  6:55 PM   Result Value Ref Range   Salicylate Lvl <9.6 2.8 - 30.0 mg/dL  Acetaminophen level     Status: Abnormal   Collection Time: 04/21/15  6:55 PM  Result Value Ref Range   Acetaminophen (Tylenol), Serum <10 (L) 10 - 30 ug/mL    Comment:        THERAPEUTIC CONCENTRATIONS VARY SIGNIFICANTLY. A RANGE OF 10-30 ug/mL MAY BE AN EFFECTIVE CONCENTRATION FOR MANY PATIENTS. HOWEVER, SOME ARE BEST TREATED AT CONCENTRATIONS OUTSIDE THIS RANGE. ACETAMINOPHEN CONCENTRATIONS >150 ug/mL AT 4 HOURS AFTER INGESTION AND >50 ug/mL AT 12 HOURS AFTER INGESTION ARE OFTEN ASSOCIATED WITH TOXIC REACTIONS.   CBC     Status: None   Collection Time: 04/21/15  6:55 PM  Result Value Ref Range   WBC 5.2 4.0 - 10.5 K/uL   RBC 4.21 3.87 - 5.11 MIL/uL   Hemoglobin 12.4 12.0 - 15.0 g/dL   HCT 37.5 36.0 - 46.0 %   MCV 89.1 78.0 - 100.0 fL   MCH 29.5 26.0 - 34.0 pg   MCHC 33.1 30.0 - 36.0 g/dL   RDW 15.2 11.5 - 15.5 %   Platelets 263 150 - 400 K/uL  Urine rapid drug screen (hosp performed) (Not at [facility])     Status: Abnormal   Collection Time: 04/22/15  8:46 AM  Result Value Ref Range   Opiates POSITIVE (A) NONE DETECTED   Cocaine NONE DETECTED NONE DETECTED   Benzodiazepines POSITIVE (A) NONE DETECTED   Amphetamines NONE DETECTED NONE DETECTED   Tetrahydrocannabinol NONE DETECTED NONE DETECTED   Barbiturates NONE DETECTED NONE DETECTED    Comment:        DRUG SCREEN FOR MEDICAL PURPOSES ONLY.  IF CONFIRMATION IS NEEDED FOR ANY PURPOSE, NOTIFY LAB WITHIN  5 DAYS.        LOWEST DETECTABLE LIMITS FOR URINE DRUG SCREEN Drug Class       Cutoff (ng/mL) Amphetamine      1000 Barbiturate      200 Benzodiazepine   378 Tricyclics       588 Opiates          300 Cocaine          300 THC              50     Current Facility-Administered Medications  Medication Dose Route Frequency Provider Last Rate Last Dose  . acetaminophen (TYLENOL) tablet 650 mg  650 mg Oral Q4H PRN Evelina Bucy, MD      . amLODipine  (NORVASC) tablet 2.5 mg  2.5 mg Oral Daily Evelina Bucy, MD   2.5 mg at 04/21/15 2221  . gabapentin (NEURONTIN) capsule 300 mg  300 mg Oral TID Evelina Bucy, MD   300 mg at 04/21/15 2155  . hydrOXYzine (ATARAX/VISTARIL) tablet 50 mg  50 mg Oral QHS,MR X 1 Evelina Bucy, MD   50 mg at 04/21/15 2220  . lamoTRIgine (LAMICTAL) tablet 150 mg  150 mg Oral QHS Evelina Bucy, MD   150 mg at 04/21/15 2221  . LORazepam (ATIVAN) tablet 1 mg  1 mg Oral Q8H PRN Evelina Bucy, MD   1 mg at 04/22/15 1401  . losartan (COZAAR) tablet 100 mg  100 mg Oral Daily PRN Evelina Bucy, MD   100 mg at 04/21/15 2220  . nicotine (NICODERM CQ - dosed in mg/24 hours) patch 21 mg  21 mg Transdermal Daily Evelina Bucy, MD   21 mg at 04/21/15 2326  . ondansetron (ZOFRAN) tablet 4 mg  4 mg Oral Q8H PRN Evelina Bucy, MD      . pantoprazole (PROTONIX) EC tablet 40 mg  40 mg Oral Daily Evelina Bucy, MD   40 mg at 04/21/15 2326  . risperiDONE (RISPERDAL) tablet 0.5 mg  0.5 mg Oral QHS Evelina Bucy, MD   0.5 mg at 04/21/15 2156  . zolpidem (AMBIEN) tablet 5 mg  5 mg Oral QHS PRN Evelina Bucy, MD       Current Outpatient Prescriptions  Medication Sig Dispense Refill  . amLODipine (NORVASC) 5 MG tablet Take 5 mg by mouth daily.  1  . Ascorbic Acid (VITAMIN C) 1000 MG tablet Take 1,000 mg by mouth daily.    . chlorhexidine (PERIDEX) 0.12 % solution Rinse mouth BID  0  . clonazePAM (KLONOPIN) 1 MG tablet Take 1 mg by mouth 4 (four) times daily as needed for anxiety.   3  . fluticasone (FLONASE) 50 MCG/ACT nasal spray Place 1 spray into both nostrils daily.    Marland Kitchen gabapentin (NEURONTIN) 300 MG capsule Take 1 capsule (300 mg total) by mouth 3 (three) times daily. (Patient taking differently: Take 300 mg by mouth 2 (two) times daily. ) 90 capsule 0  . HYDROcodone-acetaminophen (NORCO) 10-325 MG tablet Take 1 tablet by mouth 4 (four) times daily as needed for moderate pain.   0  . lamoTRIgine (LAMICTAL) 150 MG tablet Take 1 tablet (150 mg total)  by mouth at bedtime. 30 tablet 0  . losartan (COZAAR) 100 MG tablet Take 1 tablet (100 mg total) by mouth daily as needed (for blood pressue greater 180/60).    Marland Kitchen MAGNESIUM PO Take 1 tablet by mouth every evening.    . pantoprazole (PROTONIX) 40 MG tablet Take 1 tablet (40 mg  total) by mouth daily. 30 tablet 0  . risperiDONE (RISPERDAL) 0.5 MG tablet Take 1 tablet (0.5 mg total) by mouth at bedtime. 30 tablet 0  . hydrOXYzine (ATARAX/VISTARIL) 50 MG tablet Take 1 tablet (50 mg total) by mouth at bedtime and may repeat dose one time if needed. (Patient not taking: Reported on 04/21/2015) 30 tablet 0  . metroNIDAZOLE (FLAGYL) 500 MG tablet Take 1 tablet (500 mg total) by mouth every 12 (twelve) hours. (Patient not taking: Reported on 04/21/2015)      Musculoskeletal: Strength & Muscle Tone: within normal limits Gait & Station: normal Patient leans: N/A  Psychiatric Specialty Exam: Review of Systems  Constitutional: Negative.   HENT: Negative.   Eyes: Negative.   Respiratory: Negative.   Cardiovascular: Negative.   Gastrointestinal: Negative.   Genitourinary: Negative.   Musculoskeletal: Negative.   Skin: Negative.   Neurological: Negative.   Endo/Heme/Allergies: Negative.   Psychiatric/Behavioral: Positive for depression, suicidal ideas and hallucinations.    Blood pressure 92/49, pulse 63, temperature 98.1 F (36.7 C), temperature source Oral, resp. rate 18, SpO2 99 %.There is no weight on file to calculate BMI.  General Appearance: Casual  Eye Contact::  Fair  Speech:  Normal Rate  Volume:  Normal  Mood:  Depressed  Affect:  Blunt  Thought Process:  Coherent  Orientation:  Full (Time, Place, and Person)  Thought Content:  Hallucinations: Auditory Visual and Paranoid Ideation  Suicidal Thoughts:  No  Homicidal Thoughts:  No  Memory:  Immediate;   Fair Recent;   Fair Remote;   Fair  Judgement:  Impaired  Insight:  Fair  Psychomotor Activity:  Decreased  Concentration:   Fair  Recall:  [employer] of Knowledge:Fair  Language: Fair  Akathisia:  No  Handed:  Right  AIMS (if indicated):     Assets:  Housing Leisure Time Physical Health Resilience Social Support  ADL's:  Intact  Cognition: WNL  Sleep:      Treatment Plan Summary: Daily contact with patient to assess and evaluate symptoms and progress in treatment, Medication management and Plan major depressive disorder, recurrent, severe, with psychosis: -Crisis stabilization  -Medication management:  Lamictal 150 mg daily for mood stabilization restarted along with Ativan 1 mg every six hours PRN anxiety.  Risperdal 0.5 mg BID for paranoia started -Individual counseling  Disposition: Recommend psychiatric Inpatient admission when medically cleared.  Waylan Boga PMH-NP 04/22/2015 4:18 PM Patient seen face-to-face for psychiatric evaluation, chart reviewed and case discussed with the physician extender and developed treatment plan. Reviewed the information documented and agree with the treatment plan. Corena Pilgrim, MD
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
## 2015-04-23 DIAGNOSIS — G8929 Other chronic pain: Secondary | ICD-10-CM | POA: Diagnosis present

## 2015-04-23 DIAGNOSIS — M1990 Unspecified osteoarthritis, unspecified site: Secondary | ICD-10-CM | POA: Diagnosis present

## 2015-04-23 DIAGNOSIS — F315 Bipolar disorder, current episode depressed, severe, with psychotic features: Secondary | ICD-10-CM | POA: Diagnosis present

## 2015-04-23 DIAGNOSIS — I10 Essential (primary) hypertension: Secondary | ICD-10-CM | POA: Diagnosis present

## 2015-04-23 DIAGNOSIS — F22 Delusional disorders: Secondary | ICD-10-CM | POA: Diagnosis not present

## 2015-04-23 DIAGNOSIS — K219 Gastro-esophageal reflux disease without esophagitis: Secondary | ICD-10-CM | POA: Diagnosis present

## 2015-04-23 DIAGNOSIS — M549 Dorsalgia, unspecified: Secondary | ICD-10-CM | POA: Diagnosis present

## 2015-04-23 MED ORDER — HYDROCODONE-ACETAMINOPHEN 5-325 MG PO TABS
1.0000 | ORAL_TABLET | Freq: Once | ORAL | Status: AC
  Administered 2015-04-23: 1 via ORAL
  Filled 2015-04-23: qty 1

## 2015-04-23 NOTE — BH Assessment (Signed)
Per [place] at [facility] pt has been accepted to the services of Dr. Wilber Oliphant. Nurse to nurse report # [phone]. Pt can be transported after 9 am to [facility] unit. Informed Dr. Thomasene Lot and pt's nurse of disposition.

## 2015-04-23 NOTE — ED Notes (Signed)
POC update.  Smoking cessation education done.

## 2015-04-23 NOTE — ED Notes (Signed)
Attempted phone report to [place].  Asked to wait 15 Minutes.

## 2015-04-23 NOTE — BHH Counselor (Signed)
04/23/15 Referral sent to [place], Rosana Hoes, Mikel Cella, [place], [place], [place], [place], [place], [place], [place], [place], and [place] K. Tarek Cravens, LCAS-A, LPC-A, [facility]  Counselor 04/23/2015 2:01 AM

## 2015-05-12 DIAGNOSIS — M47817 Spondylosis without myelopathy or radiculopathy, lumbosacral region: Secondary | ICD-10-CM | POA: Diagnosis not present

## 2015-05-12 DIAGNOSIS — M4696 Unspecified inflammatory spondylopathy, lumbar region: Secondary | ICD-10-CM | POA: Diagnosis not present

## 2015-05-12 DIAGNOSIS — Z79899 Other long term (current) drug therapy: Secondary | ICD-10-CM | POA: Diagnosis not present

## 2015-05-12 DIAGNOSIS — G894 Chronic pain syndrome: Secondary | ICD-10-CM | POA: Diagnosis not present

## 2015-05-12 DIAGNOSIS — M5137 Other intervertebral disc degeneration, lumbosacral region: Secondary | ICD-10-CM | POA: Diagnosis not present

## 2015-05-13 DIAGNOSIS — F319 Bipolar disorder, unspecified: Secondary | ICD-10-CM | POA: Diagnosis not present

## 2015-05-25 DIAGNOSIS — F3340 Major depressive disorder, recurrent, in remission, unspecified: Secondary | ICD-10-CM | POA: Diagnosis not present

## 2015-06-03 DIAGNOSIS — R196 Halitosis: Secondary | ICD-10-CM | POA: Diagnosis not present

## 2015-06-03 DIAGNOSIS — K219 Gastro-esophageal reflux disease without esophagitis: Secondary | ICD-10-CM | POA: Diagnosis not present

## 2015-06-08 DIAGNOSIS — D492 Neoplasm of unspecified behavior of bone, soft tissue, and skin: Secondary | ICD-10-CM | POA: Diagnosis not present

## 2015-06-08 DIAGNOSIS — H01006 Unspecified blepharitis left eye, unspecified eyelid: Secondary | ICD-10-CM | POA: Diagnosis not present

## 2015-06-09 DIAGNOSIS — M4696 Unspecified inflammatory spondylopathy, lumbar region: Secondary | ICD-10-CM | POA: Diagnosis not present

## 2015-06-09 DIAGNOSIS — M1288 Other specific arthropathies, not elsewhere classified, other specified site: Secondary | ICD-10-CM | POA: Diagnosis not present

## 2015-06-09 DIAGNOSIS — M5137 Other intervertebral disc degeneration, lumbosacral region: Secondary | ICD-10-CM | POA: Diagnosis not present

## 2015-06-09 DIAGNOSIS — G894 Chronic pain syndrome: Secondary | ICD-10-CM | POA: Diagnosis not present

## 2015-06-09 DIAGNOSIS — M47817 Spondylosis without myelopathy or radiculopathy, lumbosacral region: Secondary | ICD-10-CM | POA: Diagnosis not present

## 2015-06-09 DIAGNOSIS — Z79899 Other long term (current) drug therapy: Secondary | ICD-10-CM | POA: Diagnosis not present

## 2015-06-14 ENCOUNTER — Other Ambulatory Visit: Payer: Self-pay

## 2015-06-14 DIAGNOSIS — L820 Inflamed seborrheic keratosis: Secondary | ICD-10-CM | POA: Diagnosis not present

## 2015-07-01 ENCOUNTER — Encounter: Payer: Self-pay | Admitting: Cardiology

## 2015-07-01 ENCOUNTER — Encounter: Payer: Medicare Other | Admitting: Cardiology

## 2015-07-01 NOTE — Progress Notes (Signed)
This encounter was created in error - please disregard.

## 2015-07-09 DIAGNOSIS — M47817 Spondylosis without myelopathy or radiculopathy, lumbosacral region: Secondary | ICD-10-CM | POA: Diagnosis not present

## 2015-07-09 DIAGNOSIS — M25569 Pain in unspecified knee: Secondary | ICD-10-CM | POA: Diagnosis not present

## 2015-07-09 DIAGNOSIS — Z79899 Other long term (current) drug therapy: Secondary | ICD-10-CM | POA: Diagnosis not present

## 2015-07-09 DIAGNOSIS — G894 Chronic pain syndrome: Secondary | ICD-10-CM | POA: Diagnosis not present

## 2015-07-12 DIAGNOSIS — F319 Bipolar disorder, unspecified: Secondary | ICD-10-CM | POA: Diagnosis not present

## 2015-07-20 ENCOUNTER — Ambulatory Visit (INDEPENDENT_AMBULATORY_CARE_PROVIDER_SITE_OTHER): Payer: Medicare Other | Admitting: Nurse Practitioner

## 2015-07-20 ENCOUNTER — Encounter: Payer: Self-pay | Admitting: Nurse Practitioner

## 2015-07-20 VITALS — BP 140/80 | HR 64 | Ht 62.0 in | Wt 144.8 lb

## 2015-07-20 DIAGNOSIS — I10 Essential (primary) hypertension: Secondary | ICD-10-CM

## 2015-07-20 DIAGNOSIS — I080 Rheumatic disorders of both mitral and aortic valves: Secondary | ICD-10-CM

## 2015-07-20 NOTE — Patient Instructions (Addendum)
We will be checking the following labs today - NONE  Fasting labs on day of echocardiogram - BMET, Lipids and HPF   Medication Instructions:    Continue with your current medicines.     Testing/Procedures To Be Arranged:  Echocardiogram  Follow-Up:   See Dr. Burt Knack in one year    Other Special Instructions:   No need for antibiotics at this time prior to dental procedures.     If you need a refill on your cardiac medications before your next appointment, please call your pharmacy.   Call the [place] office at [phone] if you have any questions, problems or concerns.

## 2015-07-20 NOTE — Progress Notes (Signed)
CARDIOLOGY OFFICE NOTE  Date:  07/20/2015    Nichole Gibbs Date of Birth: 1956-03-02 Medical Record [id number]  PCP:  Reginia Naas, MD  Cardiologist:  Burt Knack    Chief Complaint  Patient presents with  . Hypertension  . Cardiac Valve Problem    Follow up visit - seen for Dr. Burt Knack    History of Present Illness: Nichole Gibbs is a 60 y.o. female who presents today for a follow up visit. Seen for Dr. Burt Knack. She is followed for mitral valve prolapse with mitral regurgitation. She also has essential hypertension.   Last seen in July of 2015. Felt to be stable from our standpoint.   Admitted in July of 2016 with suicide attempt. In the ER back in October with severe major depressive disorder with psychotic features/suicidal ideations. Continues with close psyche follow up.   Comes back today. Here alone. Says she has done ok since last visit here - but did have some chest pain 3 nights ago - took aspirin and vinegar. Admits to extra stress. No real palpitations. Continues to have issues with anxiety. Asking about antibiotics. She has been exercising about 20 minutes on the elliptical without issue.   Past Medical History  Diagnosis Date  . Hypertension   . Mitral valve prolapse     cardiologist-   dr Burt Knack  . Anxiety   . GERD (gastroesophageal reflux disease)   . Bipolar 1 disorder ([place])   . Osteoarthritis   . Rotator cuff syndrome     right side  . Chest pain     denies at this time 10/7  . Anemia   . Depression   . Right patella fracture   . Moderate mitral regurgitation   . Memory loss     pt states mild memory loss    Past Surgical History  Procedure Laterality Date  . Cesarean section    . Tonsillectomy  09-07-2003  . Cauterization post tonsillectomy  09-25-2003  . Anterior cervical decomp/discectomy fusion  05-19-2009    C4 --  C7  and C5 corpectomy  . Re-exploration anterior cervical wound and evacuation hematoma  05-19-2009  .  Cardiovascular stress test  11-20-2012  dr cooper    normal perfusion study/  no ischemia/  ef 62%  . Transthoracic echocardiogram  01-07-2014    grade II diastolic dysfunction/  ef 55-60%/  mild late systolic mitral valve prolapse of anterior leaflet/  moderate MR  . Patellectomy Right 04/03/2014    Procedure: PATELLECTOMY;  Surgeon: Sydnee Cabal, MD;  Location: [facility];  Service: Orthopedics;  Laterality: Right;     Medications: Current Outpatient Prescriptions  Medication Sig Dispense Refill  . amLODipine (NORVASC) 5 MG tablet Take 5 mg by mouth daily.  1  . amoxicillin (AMOXIL) 500 MG capsule Take 500 mg by mouth as directed. Prior to dental procedure    . chlorhexidine (PERIDEX) 0.12 % solution Rinse mouth BID  0  . clonazePAM (KLONOPIN) 1 MG tablet Take 1 mg by mouth 4 (four) times daily as needed for anxiety.   3  . fluticasone (FLONASE) 50 MCG/ACT nasal spray Place 1 spray into both nostrils daily.    Marland Kitchen gabapentin (NEURONTIN) 300 MG capsule Take 1 capsule (300 mg total) by mouth 3 (three) times daily. (Patient taking differently: Take 300 mg by mouth 2 (two) times daily. ) 90 capsule 0  . HYDROcodone-acetaminophen (NORCO) 10-325 MG tablet Take 1 tablet by mouth 4 (four) times  daily as needed for moderate pain.   0  . lamoTRIgine (LAMICTAL) 150 MG tablet Take 1 tablet (150 mg total) by mouth at bedtime. 30 tablet 0  . MAGNESIUM PO Take 1 tablet by mouth every evening.    . pantoprazole (PROTONIX) 40 MG tablet Take 1 tablet (40 mg total) by mouth daily. 30 tablet 0  . risperiDONE (RISPERDAL) 0.5 MG tablet Take 1 tablet (0.5 mg total) by mouth at bedtime. 30 tablet 0   No current facility-administered medications for this visit.    Allergies: Allergies  Allergen Reactions  . Tizanidine     Pt felt paranoid, homicidal, sweating, could not sleep   . Acetaminophen     Upsets stomach   . Ibuprofen Other (See Comments)    Pt states it causes stomach pains d/t hx  of ulcers  . Lactose Intolerance (Gi) Other (See Comments)    Gas/Bloating/upset stomach    Social History: The patient  reports that she has quit smoking. Her smoking use included Cigarettes. She has a 30 pack-year smoking history. She has never used smokeless tobacco. She reports that she drinks about 0.6 oz of alcohol per week. She reports that she does not use illicit drugs.   Family History: The patient's family history includes Bone cancer in her paternal grandfather; Cirrhosis in her paternal uncle; Colon polyps in her father; Esophageal cancer in her maternal uncle; Heart attack in her father; Heart disease in her mother; Liver cancer in her paternal uncle; Other in her maternal aunt; Skin cancer in her father; Uterine cancer in her paternal grandmother.   Review of Systems: Please see the history of present illness.   Otherwise, the review of systems is positive for none.   All other systems are reviewed and negative.   Physical Exam: VS:  BP 140/80 mmHg  Pulse 64  Ht 5\' 2"  (1.575 m)  Wt 144 lb 12.8 oz (65.681 kg)  BMI 26.48 kg/m2 .  BMI Body mass index is 26.48 kg/(m^2).  Wt Readings from Last 3 Encounters:  07/20/15 144 lb 12.8 oz (65.681 kg)  07/01/15 143 lb 3.2 oz (64.955 kg)  01/07/15 134 lb (60.782 kg)    General: Pleasant. Affect little flat. Well developed, well nourished and in no acute distress.  HEENT: Normal. Neck: Supple, no JVD, carotid bruits, or masses noted.  Cardiac: Regular rate and rhythm. Outflow murmur and systolic murmur noted. No edema.  Respiratory:  Lungs are clear to auscultation bilaterally with normal work of breathing.  GI: Soft and nontender.  MS: No deformity or atrophy. Gait and ROM intact. Skin: Warm and dry. Color is normal.  Neuro:  Strength and sensation are intact and no gross focal deficits noted.  Psych: Alert, appropriate and with normal affect.   LABORATORY DATA:  EKG:  EKG is ordered today. This demonstrates NSR.  Lab  Results  Component Value Date   WBC 5.2 04/21/2015   HGB 12.4 04/21/2015   HCT 37.5 04/21/2015   PLT 263 04/21/2015   GLUCOSE 87 04/21/2015   ALT 16 04/21/2015   AST 23 04/21/2015   NA 140 04/21/2015   K 4.2 04/21/2015   CL 105 04/21/2015   CREATININE 0.81 04/21/2015   BUN 10 04/21/2015   CO2 28 04/21/2015   TSH 0.609 01/07/2015   INR 1.04 05/19/2009    BNP (last 3 results) No results for input(s): BNP in the last 8760 hours.  ProBNP (last 3 results) No results for input(s): PROBNP in the  last 8760 hours.   Other Studies Reviewed Today:  Echo Study Conclusions from 12/2013  - Left ventricle: The cavity size was normal. Systolic function was normal. The estimated ejection fraction was in the range of 55% to 60%. Wall motion was normal; there were no regional wall motion abnormalities. Features are consistent with a pseudonormal left ventricular filling pattern, with concomitant abnormal relaxation and increased filling pressure (grade 2 diastolic dysfunction). - Mitral valve: Mild, late systolicprolapse, involving the anterior leaflet. There was moderate regurgitation directed eccentrically and toward the septum.  Notes Recorded by Imogene Burn, PA-C on 11/27/2012 at 8:15 AM Overall Impression: Normal stress nuclear study. No evidence of ischemia.    LV Ejection Fraction: 62%. LV Wall Motion: NL LV Function; NL Wall Motion      Assessment/Plan: 1. Known valvular disease - needs echo updated  2. HTN - recheck BP by me is 120/80. I have left her on her current regimen.   3. Chest pain - EKG ok. Will proceed with echo.  4. General health maintenance - needs fasting labs on day of echo visit.   Current medicines are reviewed with the patient today.  The patient does not have concerns regarding medicines other than what has been noted above.  The following changes have been made:  See above.  Labs/ tests ordered today include:    Orders  Placed This Encounter  Procedures  . Basic metabolic panel  . Hepatic function panel  . Lipid panel  . EKG 12-Lead  . ECHOCARDIOGRAM COMPLETE     Disposition:   FU with Dr. Burt Knack in one year unless echo would demonstrate otherwise.  Patient is agreeable to this plan and will call if any problems develop in the interim.   Signed: Burtis Junes, RN, ANP-C 07/20/2015 9:25 AM  [facility] [address] [place], [place]  [address] Phone: [phone] Fax: [phone]

## 2015-07-21 DIAGNOSIS — I10 Essential (primary) hypertension: Secondary | ICD-10-CM | POA: Diagnosis not present

## 2015-07-21 DIAGNOSIS — F319 Bipolar disorder, unspecified: Secondary | ICD-10-CM | POA: Diagnosis not present

## 2015-07-21 DIAGNOSIS — F329 Major depressive disorder, single episode, unspecified: Secondary | ICD-10-CM | POA: Diagnosis not present

## 2015-07-21 DIAGNOSIS — G8929 Other chronic pain: Secondary | ICD-10-CM | POA: Diagnosis not present

## 2015-07-27 ENCOUNTER — Ambulatory Visit
Admission: RE | Admit: 2015-07-27 | Discharge: 2015-07-27 | Disposition: A | Discharge status: Home / Self Care | Payer: Medicare Other | Source: Ambulatory Visit | Attending: Anesthesiology | Admitting: Anesthesiology

## 2015-07-27 ENCOUNTER — Other Ambulatory Visit: Payer: Self-pay | Admitting: Anesthesiology

## 2015-07-27 DIAGNOSIS — M25561 Pain in right knee: Secondary | ICD-10-CM

## 2015-08-02 ENCOUNTER — Other Ambulatory Visit: Payer: Medicare Other

## 2015-08-02 ENCOUNTER — Ambulatory Visit (HOSPITAL_COMMUNITY): Payer: Medicare Other

## 2015-08-02 DIAGNOSIS — F319 Bipolar disorder, unspecified: Secondary | ICD-10-CM | POA: Diagnosis not present

## 2015-08-04 DIAGNOSIS — L309 Dermatitis, unspecified: Secondary | ICD-10-CM | POA: Diagnosis not present

## 2015-08-05 DIAGNOSIS — M47817 Spondylosis without myelopathy or radiculopathy, lumbosacral region: Secondary | ICD-10-CM | POA: Diagnosis not present

## 2015-08-05 DIAGNOSIS — Z79899 Other long term (current) drug therapy: Secondary | ICD-10-CM | POA: Diagnosis not present

## 2015-08-05 DIAGNOSIS — M25569 Pain in unspecified knee: Secondary | ICD-10-CM | POA: Diagnosis not present

## 2015-08-05 DIAGNOSIS — G894 Chronic pain syndrome: Secondary | ICD-10-CM | POA: Diagnosis not present

## 2015-08-24 DIAGNOSIS — R300 Dysuria: Secondary | ICD-10-CM | POA: Diagnosis not present

## 2015-08-24 DIAGNOSIS — I341 Nonrheumatic mitral (valve) prolapse: Secondary | ICD-10-CM | POA: Diagnosis not present

## 2015-08-24 DIAGNOSIS — R319 Hematuria, unspecified: Secondary | ICD-10-CM | POA: Diagnosis not present

## 2015-09-02 DIAGNOSIS — Z79899 Other long term (current) drug therapy: Secondary | ICD-10-CM | POA: Diagnosis not present

## 2015-09-02 DIAGNOSIS — M5137 Other intervertebral disc degeneration, lumbosacral region: Secondary | ICD-10-CM | POA: Diagnosis not present

## 2015-09-02 DIAGNOSIS — G894 Chronic pain syndrome: Secondary | ICD-10-CM | POA: Diagnosis not present

## 2015-09-02 DIAGNOSIS — M47817 Spondylosis without myelopathy or radiculopathy, lumbosacral region: Secondary | ICD-10-CM | POA: Diagnosis not present

## 2015-09-02 DIAGNOSIS — M25569 Pain in unspecified knee: Secondary | ICD-10-CM | POA: Diagnosis not present

## 2015-09-03 ENCOUNTER — Other Ambulatory Visit (INDEPENDENT_AMBULATORY_CARE_PROVIDER_SITE_OTHER): Payer: Medicare Other | Admitting: *Deleted

## 2015-09-03 ENCOUNTER — Other Ambulatory Visit: Payer: Self-pay

## 2015-09-03 ENCOUNTER — Ambulatory Visit (HOSPITAL_COMMUNITY): Payer: Medicare Other | Attending: Cardiology

## 2015-09-03 DIAGNOSIS — I080 Rheumatic disorders of both mitral and aortic valves: Secondary | ICD-10-CM

## 2015-09-03 DIAGNOSIS — I340 Nonrheumatic mitral (valve) insufficiency: Secondary | ICD-10-CM | POA: Diagnosis not present

## 2015-09-03 DIAGNOSIS — I119 Hypertensive heart disease without heart failure: Secondary | ICD-10-CM | POA: Diagnosis not present

## 2015-09-03 DIAGNOSIS — F319 Bipolar disorder, unspecified: Secondary | ICD-10-CM | POA: Diagnosis not present

## 2015-09-03 DIAGNOSIS — I10 Essential (primary) hypertension: Secondary | ICD-10-CM

## 2015-09-03 DIAGNOSIS — I313 Pericardial effusion (noninflammatory): Secondary | ICD-10-CM | POA: Diagnosis not present

## 2015-09-03 DIAGNOSIS — F419 Anxiety disorder, unspecified: Secondary | ICD-10-CM | POA: Diagnosis not present

## 2015-09-03 DIAGNOSIS — F411 Generalized anxiety disorder: Secondary | ICD-10-CM | POA: Diagnosis not present

## 2015-09-03 LAB — BASIC METABOLIC PANEL
BUN: 10 mg/dL (ref 7–25)
CO2: 28 mmol/L (ref 20–31)
Calcium: 9.7 mg/dL (ref 8.6–10.4)
Chloride: 104 mmol/L (ref 98–110)
Creat: 0.84 mg/dL (ref 0.50–1.05)
Glucose, Bld: 80 mg/dL (ref 65–99)
Potassium: 4.6 mmol/L (ref 3.5–5.3)
Sodium: 141 mmol/L (ref 135–146)

## 2015-09-03 LAB — ECHOCARDIOGRAM COMPLETE
AORTIC ROOT 2D: 27 mm
E decel time: 268 msec
E/e' ratio: 19.26
ESTIMATED CVP: 3 mm HG
FS: 30 % (ref 28–44)
IVS/LV PW RATIO, ED: 0.79
LA ID, A-P, ES: 37 mm
LA SIZE INDEX: 2.16 mm/m2
LA VOL 2D INDEX: 45 mL/m2
LA VOL 2D: 77 mL
LA diam end sys: 37 mm
LA vol index: 46.7 mL/m2
LA vol: 80 mL
LV PW d: 11.1 mm — AB (ref 0.6–1.1)
LV PW s: 11.1 mm
LV dias vol index: 35 mL/m2
LV dias vol: 60 mL (ref 46–106)
LV sys vol index: 13 mL/m2
LV sys vol: 60 mL — AB (ref 14–42)
LVIDD: 44.7 mm — AB (ref 3.5–6.0)
LVIDS: 31.2 mm — AB (ref 2.1–4.0)
LVOT MEAN VEL: 58.7 cm/s
LVOT SV INDEX: 40 mL/m2
LVOT SV: 69 mL
LVOT VTI: 21.9 cm
LVOT area: 3.14 cm2
LVOT diameter: 20 mm
LVOT peak vel: 95.2 cm/s
MV Dec: 268 ms
MV Peak grad: 5 mmHg
MV pk A vel: 120 cm/s
MV pk E vel: 114 cm/s
PISA EROA: 0.29 cm2
SV INDEX: 22.2 mL/m2
Simpson's disk: 63
Single Plane A4C EF: 59 %
Stroke v: 38 ml
TDI e' lateral: 5.92 cm/s
TDI e' medial: 8.44 cm/s
VTI: 208 cm

## 2015-09-03 LAB — LIPID PANEL
Cholesterol: 301 mg/dL — ABNORMAL HIGH (ref 125–200)
HDL: 158 mg/dL (ref >=46)
LDL Cholesterol: 127 mg/dL (ref <130)
Total CHOL/HDL Ratio: 1.9 Ratio (ref <=5.0)
Triglycerides: 79 mg/dL (ref <150)
VLDL: 16 mg/dL (ref <30)

## 2015-09-03 LAB — HEPATIC FUNCTION PANEL
ALT: 14 U/L (ref 6–29)
AST: 16 U/L (ref 10–35)
Albumin: 4.7 g/dL (ref 3.6–5.1)
Alkaline Phosphatase: 60 U/L (ref 33–130)
Bilirubin, Direct: 0.1 mg/dL (ref <=0.2)
Indirect Bilirubin: 0.3 mg/dL (ref 0.2–1.2)
Total Bilirubin: 0.4 mg/dL (ref 0.2–1.2)
Total Protein: 7.2 g/dL (ref 6.1–8.1)

## 2015-09-03 NOTE — Addendum Note (Signed)
Addended by: Eulis Foster on: 09/03/2015 08:54 AM   Modules accepted: Orders

## 2015-09-03 NOTE — Addendum Note (Signed)
Addended by: Eulis Foster on: 09/03/2015 09:24 AM   Modules accepted: Orders

## 2015-09-06 DIAGNOSIS — G8929 Other chronic pain: Secondary | ICD-10-CM | POA: Diagnosis not present

## 2015-09-06 DIAGNOSIS — Z720 Tobacco use: Secondary | ICD-10-CM | POA: Diagnosis not present

## 2015-09-06 DIAGNOSIS — I10 Essential (primary) hypertension: Secondary | ICD-10-CM | POA: Diagnosis not present

## 2015-09-06 DIAGNOSIS — Z1239 Encounter for other screening for malignant neoplasm of breast: Secondary | ICD-10-CM | POA: Diagnosis not present

## 2015-09-07 ENCOUNTER — Telehealth: Payer: Self-pay | Admitting: Nurse Practitioner

## 2015-09-07 NOTE — Telephone Encounter (Signed)
New Message  Darleen Crocker is the name of the nurse practitioner that the labs will need to be sent to. The number is [phone]. She states that she originally gave the wrong information. Please call if you may need to discuss further

## 2015-09-07 NOTE — Telephone Encounter (Signed)
Pt called back to give me correct information on NP, I called the office at [phone] to get fax number and faxing over labs to [phone] per pt's request.

## 2015-09-13 ENCOUNTER — Other Ambulatory Visit: Payer: Self-pay

## 2015-09-13 DIAGNOSIS — Z1231 Encounter for screening mammogram for malignant neoplasm of breast: Secondary | ICD-10-CM

## 2015-09-14 ENCOUNTER — Ambulatory Visit
Admission: RE | Admit: 2015-09-14 | Discharge: 2015-09-14 | Disposition: A | Discharge status: Home / Self Care | Payer: Medicare Other | Source: Ambulatory Visit

## 2015-09-14 DIAGNOSIS — Z1231 Encounter for screening mammogram for malignant neoplasm of breast: Secondary | ICD-10-CM

## 2015-09-20 DIAGNOSIS — F319 Bipolar disorder, unspecified: Secondary | ICD-10-CM | POA: Diagnosis not present

## 2015-09-22 DIAGNOSIS — F331 Major depressive disorder, recurrent, moderate: Secondary | ICD-10-CM | POA: Diagnosis not present

## 2015-09-29 DIAGNOSIS — F319 Bipolar disorder, unspecified: Secondary | ICD-10-CM | POA: Diagnosis not present

## 2015-09-30 DIAGNOSIS — M47817 Spondylosis without myelopathy or radiculopathy, lumbosacral region: Secondary | ICD-10-CM | POA: Diagnosis not present

## 2015-09-30 DIAGNOSIS — Z79891 Long term (current) use of opiate analgesic: Secondary | ICD-10-CM | POA: Diagnosis not present

## 2015-09-30 DIAGNOSIS — G894 Chronic pain syndrome: Secondary | ICD-10-CM | POA: Diagnosis not present

## 2015-09-30 DIAGNOSIS — Z79899 Other long term (current) drug therapy: Secondary | ICD-10-CM | POA: Diagnosis not present

## 2015-10-05 DIAGNOSIS — F319 Bipolar disorder, unspecified: Secondary | ICD-10-CM | POA: Diagnosis not present

## 2015-10-07 DIAGNOSIS — M25562 Pain in left knee: Secondary | ICD-10-CM | POA: Diagnosis not present

## 2015-10-07 DIAGNOSIS — M545 Low back pain: Secondary | ICD-10-CM | POA: Diagnosis not present

## 2015-10-07 DIAGNOSIS — M25561 Pain in right knee: Secondary | ICD-10-CM | POA: Diagnosis not present

## 2015-11-10 DIAGNOSIS — M545 Low back pain: Secondary | ICD-10-CM | POA: Diagnosis not present

## 2015-11-10 DIAGNOSIS — Z79891 Long term (current) use of opiate analgesic: Secondary | ICD-10-CM | POA: Diagnosis not present

## 2015-11-10 DIAGNOSIS — M792 Neuralgia and neuritis, unspecified: Secondary | ICD-10-CM | POA: Diagnosis not present

## 2015-11-10 DIAGNOSIS — G894 Chronic pain syndrome: Secondary | ICD-10-CM | POA: Diagnosis not present

## 2015-11-10 DIAGNOSIS — Z79899 Other long term (current) drug therapy: Secondary | ICD-10-CM | POA: Diagnosis not present

## 2015-11-10 DIAGNOSIS — M25569 Pain in unspecified knee: Secondary | ICD-10-CM | POA: Diagnosis not present

## 2015-11-20 IMAGING — DX DG KNEE COMPLETE 4+V*R*
4 series · 4 of 4 positions shown · non-contrast
Comparison: 03/27/2014.

CLINICAL DATA: Chronic right knee pain. Surgery 2 months prior.
Fall 3-4 days ago.

EXAM:
RIGHT KNEE - COMPLETE 4+ VIEW

[knee ap]
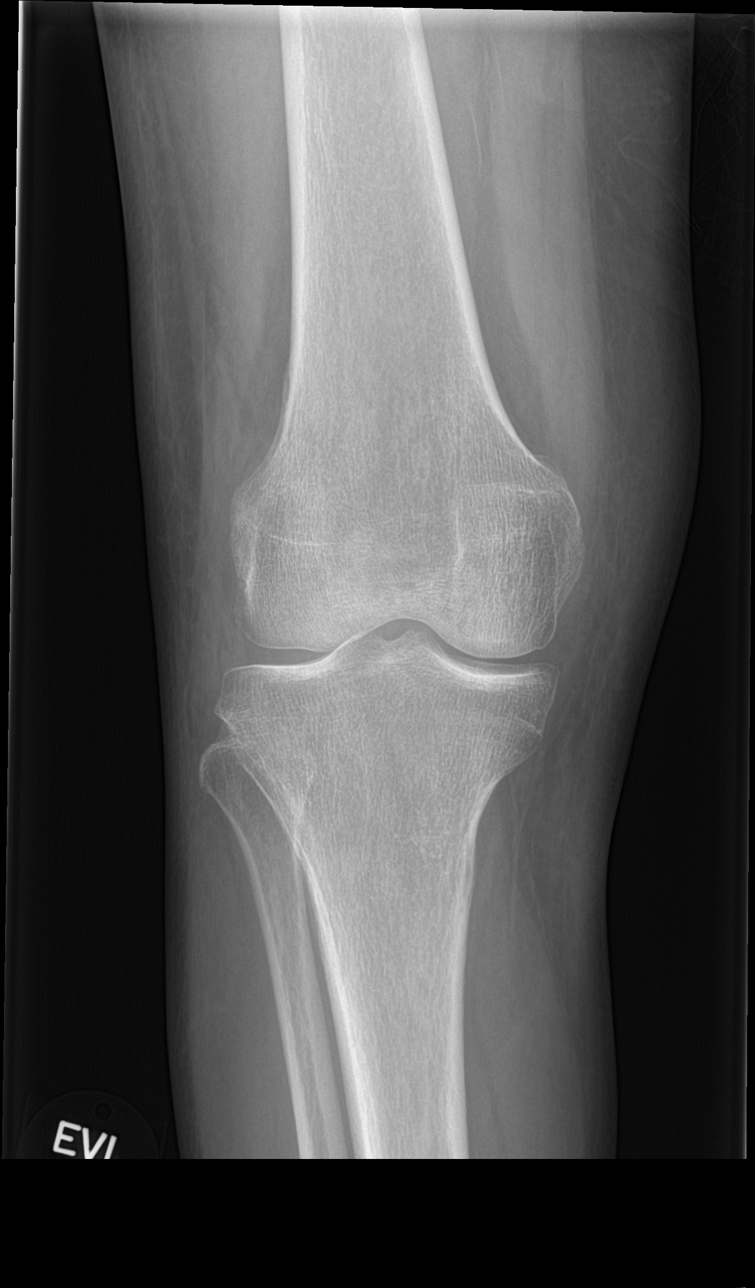

[knee obl (1 of 2)]
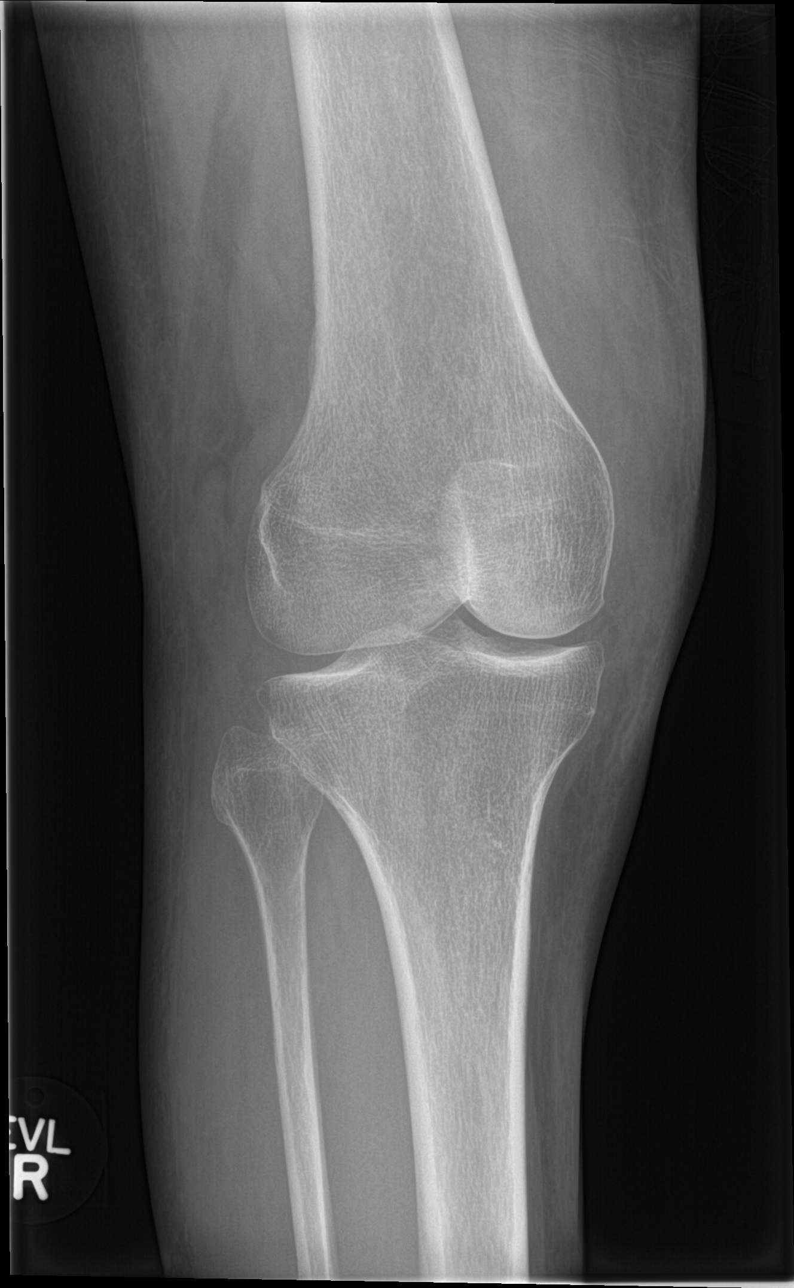

[knee obl (2 of 2)]
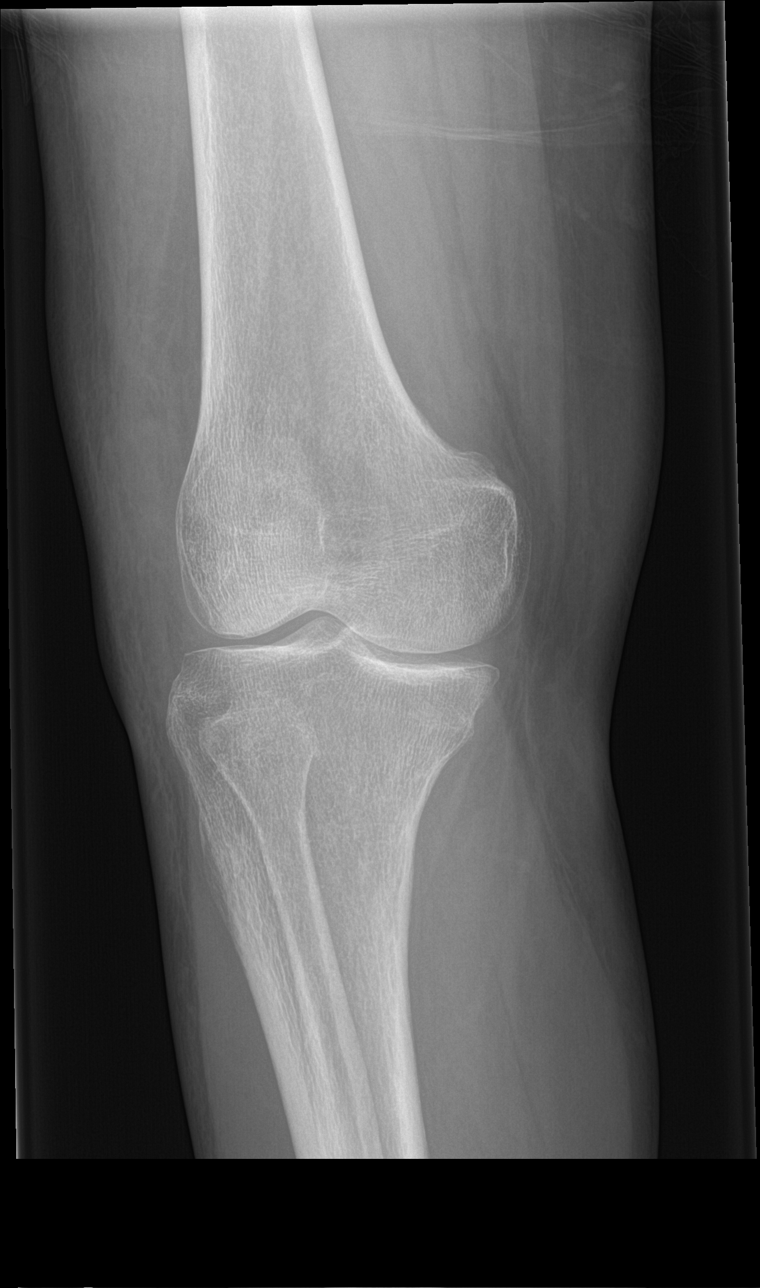

[knee lat]
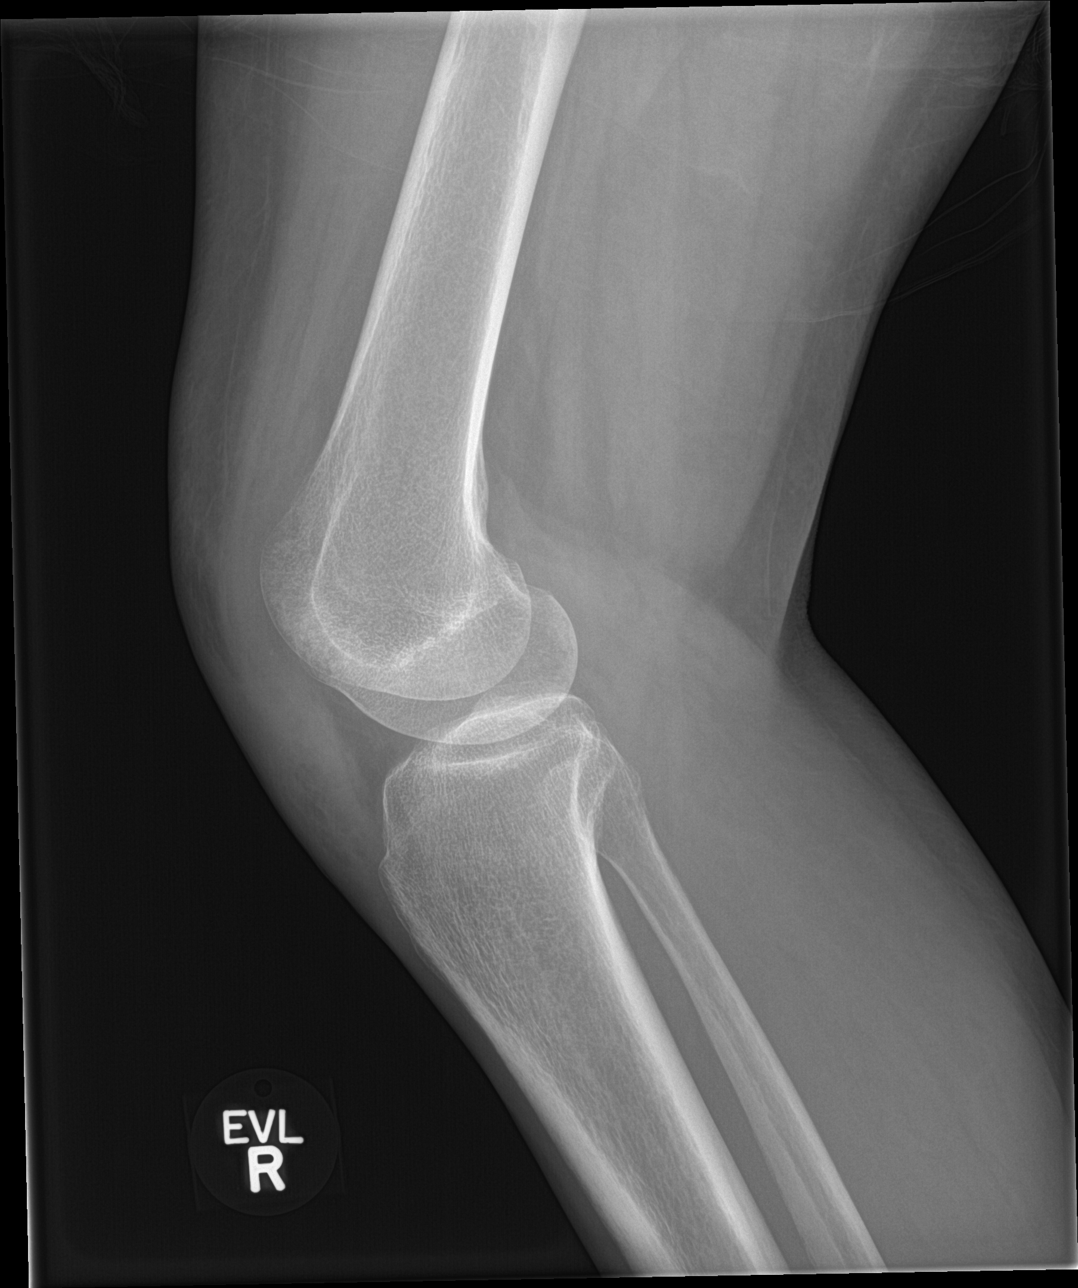

[4 of 4 positions shown; findings below may reference images not displayed]

FINDINGS: The patient is post patellar resection. Tibial femoral alignment is
maintained. There is mild soft tissue edema about the knee. No large
joint effusion. No radiopaque foreign body.
IMPRESSION: Post patellar resection. Mild soft tissue edema, no acute bony
abnormality.

## 2015-11-20 IMAGING — CR DG CHEST 2V
2 series · 2 of 2 positions shown · non-contrast
Comparison: 09/11/2012

CLINICAL DATA: Substernal mid chest pain for weeks. Shortness of
breath, dizziness.

EXAM:
CHEST  2 VIEW

[chest pa]
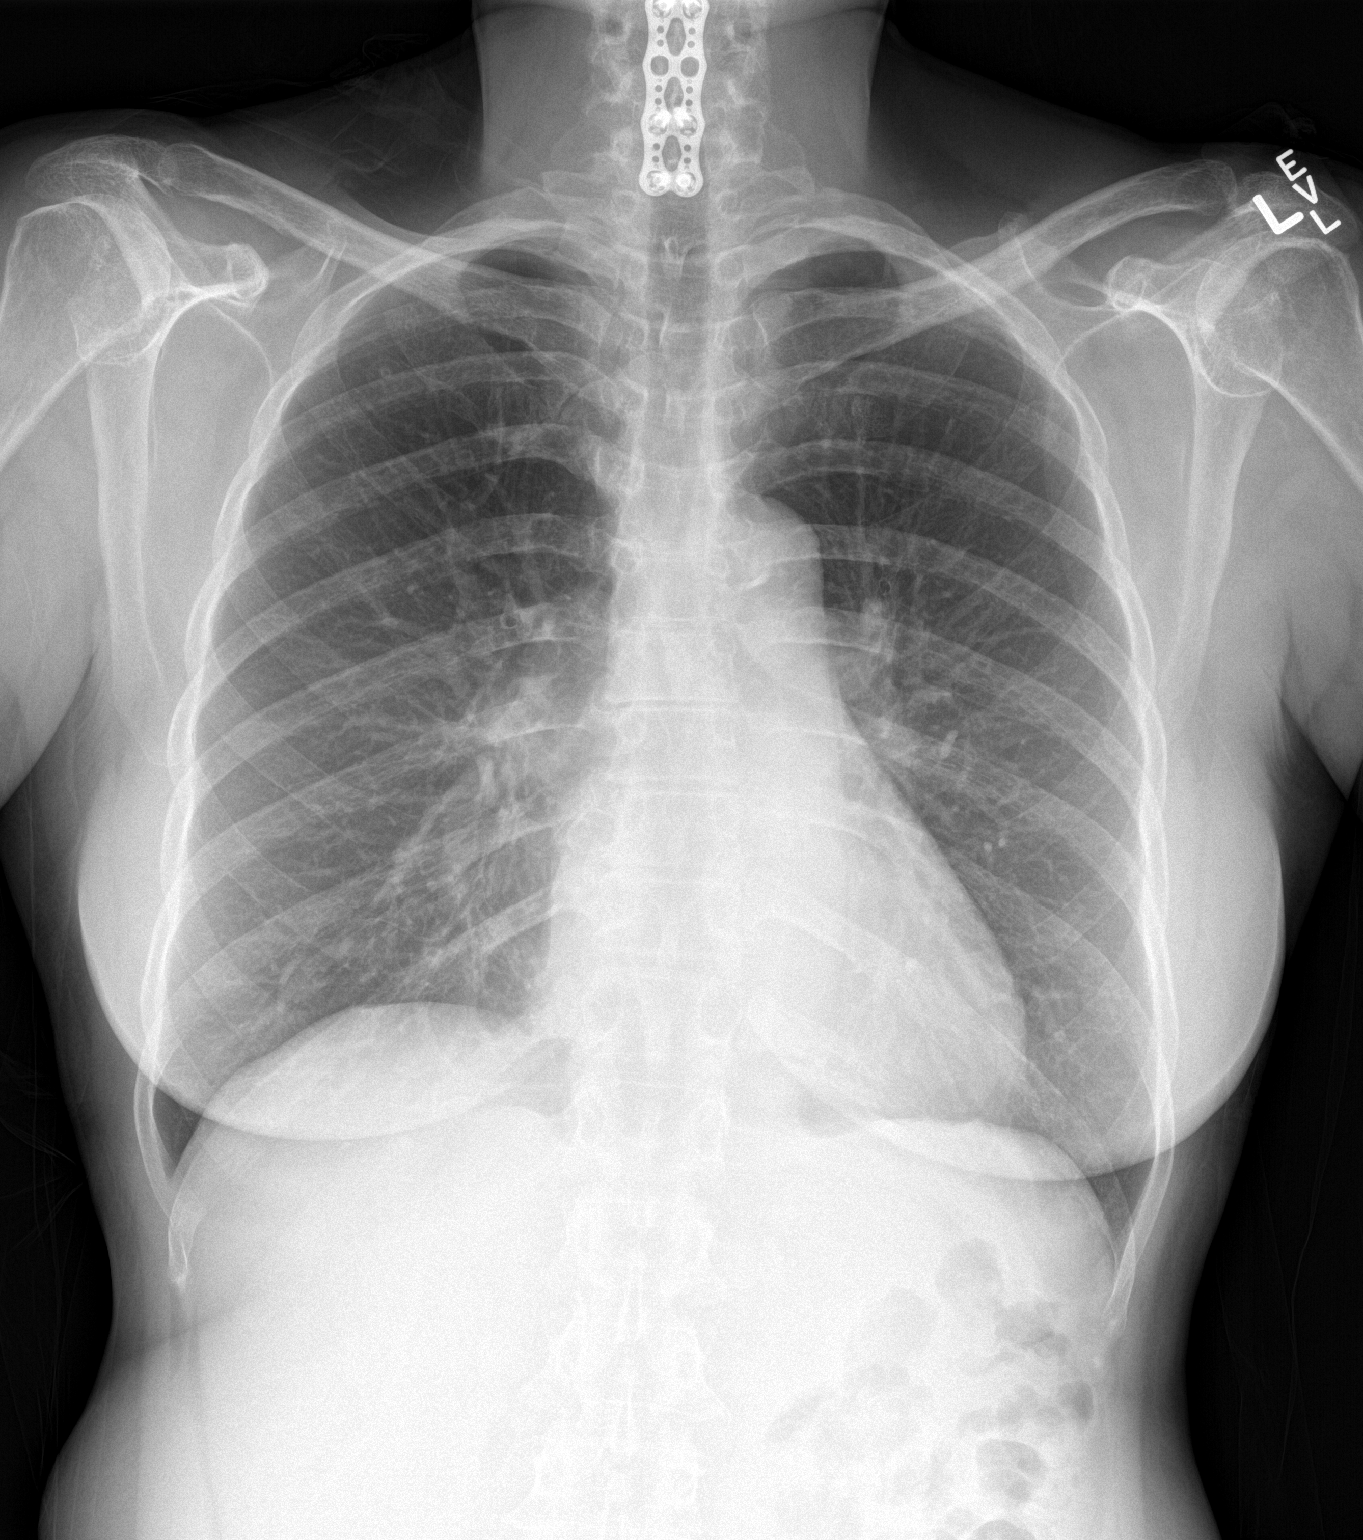

[chest lat]
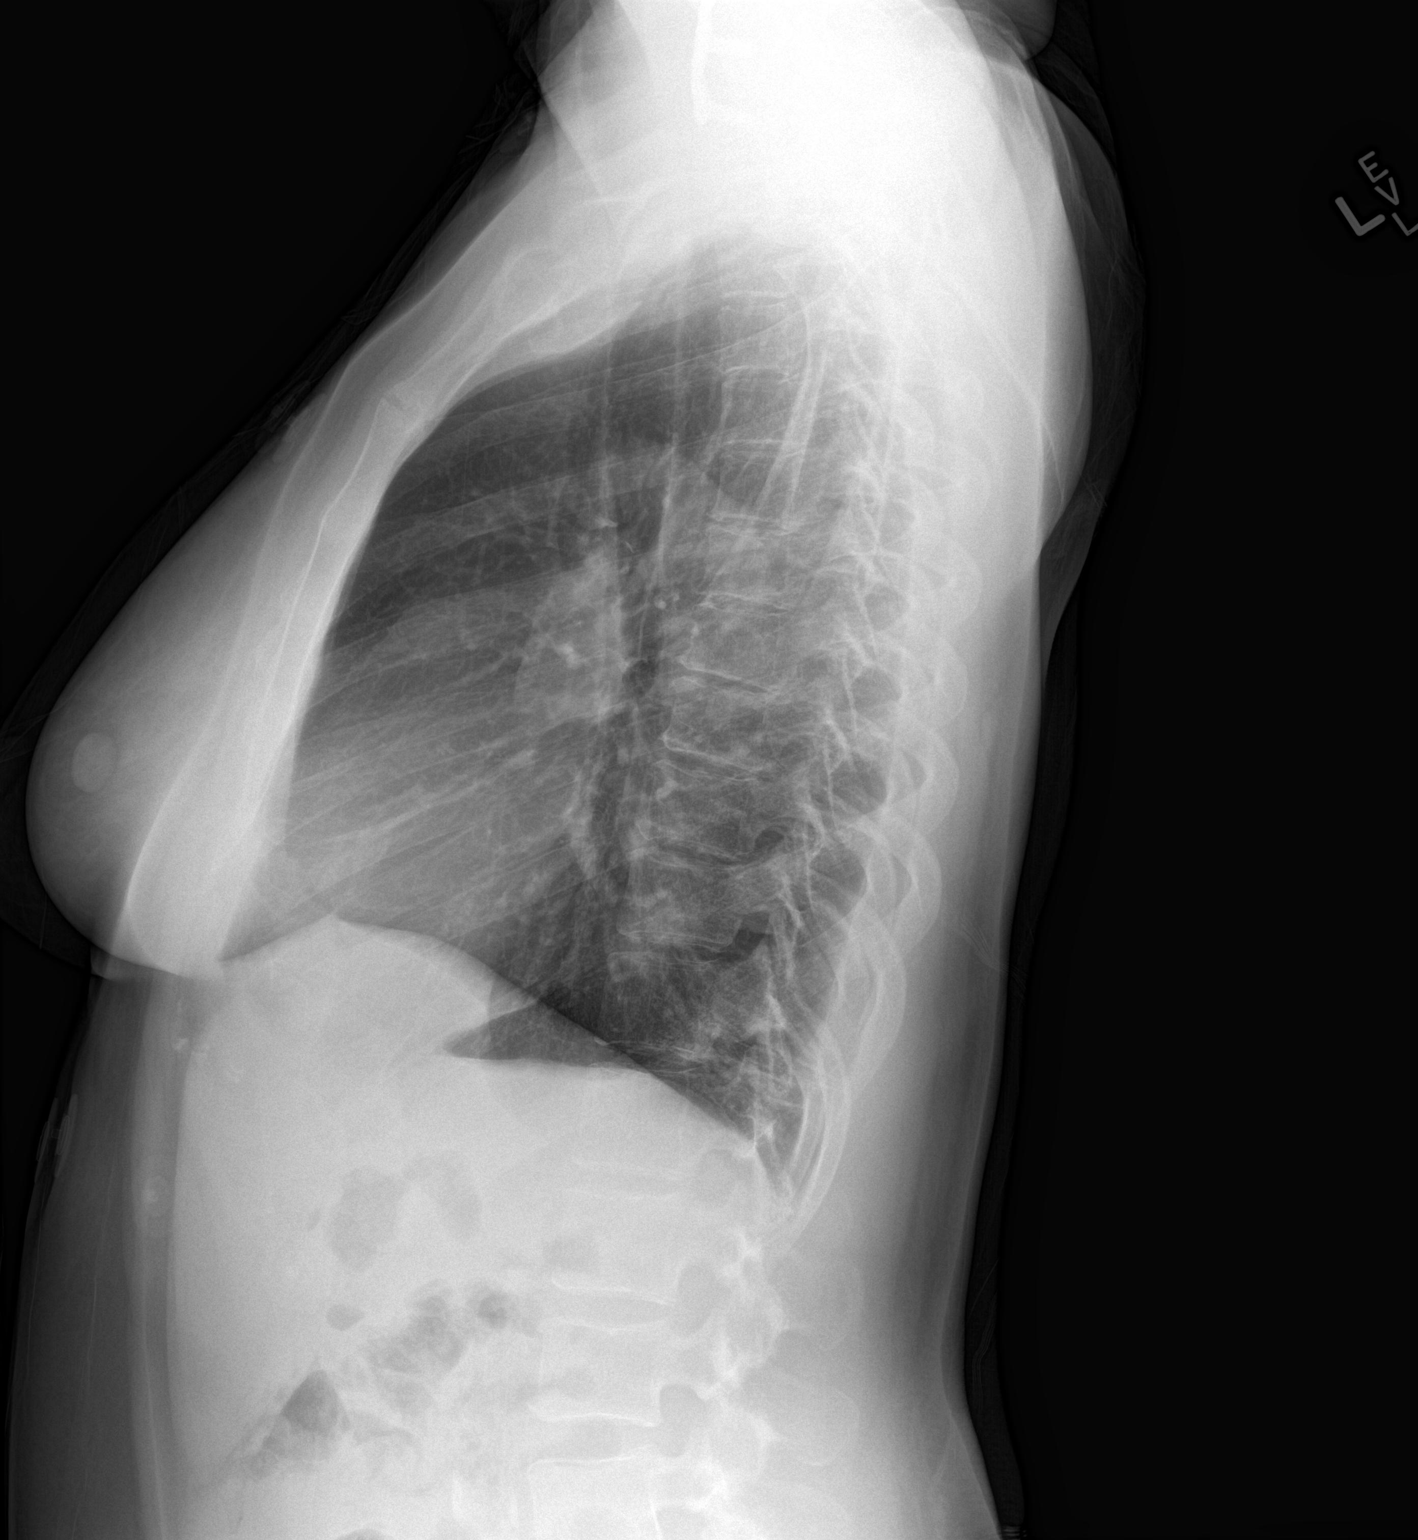

[2 of 2 positions shown; findings below may reference images not displayed]

FINDINGS: The heart size and mediastinal contours are within normal limits.
Both lungs are clear. The visualized skeletal structures are
unremarkable.
IMPRESSION: No active cardiopulmonary disease.

## 2015-12-08 DIAGNOSIS — H6123 Impacted cerumen, bilateral: Secondary | ICD-10-CM | POA: Diagnosis not present

## 2015-12-08 DIAGNOSIS — R05 Cough: Secondary | ICD-10-CM | POA: Diagnosis not present

## 2015-12-08 DIAGNOSIS — I10 Essential (primary) hypertension: Secondary | ICD-10-CM | POA: Diagnosis not present

## 2015-12-23 DIAGNOSIS — F319 Bipolar disorder, unspecified: Secondary | ICD-10-CM | POA: Diagnosis not present

## 2016-01-04 DIAGNOSIS — F319 Bipolar disorder, unspecified: Secondary | ICD-10-CM | POA: Diagnosis not present

## 2016-01-14 DIAGNOSIS — Z720 Tobacco use: Secondary | ICD-10-CM | POA: Diagnosis not present

## 2016-01-14 DIAGNOSIS — Z113 Encounter for screening for infections with a predominantly sexual mode of transmission: Secondary | ICD-10-CM | POA: Diagnosis not present

## 2016-01-14 DIAGNOSIS — N760 Acute vaginitis: Secondary | ICD-10-CM | POA: Diagnosis not present

## 2016-01-21 DIAGNOSIS — F319 Bipolar disorder, unspecified: Secondary | ICD-10-CM | POA: Diagnosis not present

## 2016-03-07 DIAGNOSIS — Z79891 Long term (current) use of opiate analgesic: Secondary | ICD-10-CM | POA: Diagnosis not present

## 2016-03-07 DIAGNOSIS — M159 Polyosteoarthritis, unspecified: Secondary | ICD-10-CM | POA: Diagnosis not present

## 2016-03-07 DIAGNOSIS — R531 Weakness: Secondary | ICD-10-CM | POA: Diagnosis not present

## 2016-03-07 DIAGNOSIS — R0789 Other chest pain: Secondary | ICD-10-CM | POA: Diagnosis not present

## 2016-03-07 DIAGNOSIS — M2550 Pain in unspecified joint: Secondary | ICD-10-CM | POA: Diagnosis not present

## 2016-04-05 DIAGNOSIS — L03115 Cellulitis of right lower limb: Secondary | ICD-10-CM | POA: Diagnosis not present

## 2016-04-26 DIAGNOSIS — F319 Bipolar disorder, unspecified: Secondary | ICD-10-CM | POA: Diagnosis not present

## 2016-05-03 DIAGNOSIS — I781 Nevus, non-neoplastic: Secondary | ICD-10-CM | POA: Diagnosis not present

## 2016-05-03 DIAGNOSIS — Z720 Tobacco use: Secondary | ICD-10-CM | POA: Diagnosis not present

## 2016-05-09 DIAGNOSIS — F3160 Bipolar disorder, current episode mixed, unspecified: Secondary | ICD-10-CM | POA: Diagnosis not present

## 2016-05-09 DIAGNOSIS — M5002 Cervical disc disorder with myelopathy, mid-cervical region, unspecified level: Secondary | ICD-10-CM | POA: Diagnosis not present

## 2016-05-09 DIAGNOSIS — M150 Primary generalized (osteo)arthritis: Secondary | ICD-10-CM | POA: Diagnosis not present

## 2016-05-09 DIAGNOSIS — M5000 Cervical disc disorder with myelopathy, unspecified cervical region: Secondary | ICD-10-CM | POA: Diagnosis not present

## 2016-05-09 DIAGNOSIS — Z0001 Encounter for general adult medical examination with abnormal findings: Secondary | ICD-10-CM | POA: Diagnosis not present

## 2016-05-09 DIAGNOSIS — M159 Polyosteoarthritis, unspecified: Secondary | ICD-10-CM | POA: Diagnosis not present

## 2016-05-31 DIAGNOSIS — F319 Bipolar disorder, unspecified: Secondary | ICD-10-CM | POA: Diagnosis not present

## 2016-06-08 DIAGNOSIS — F3160 Bipolar disorder, current episode mixed, unspecified: Secondary | ICD-10-CM | POA: Diagnosis not present

## 2016-06-08 DIAGNOSIS — M159 Polyosteoarthritis, unspecified: Secondary | ICD-10-CM | POA: Diagnosis not present

## 2016-06-08 DIAGNOSIS — M5000 Cervical disc disorder with myelopathy, unspecified cervical region: Secondary | ICD-10-CM | POA: Diagnosis not present

## 2016-06-08 DIAGNOSIS — Z0001 Encounter for general adult medical examination with abnormal findings: Secondary | ICD-10-CM | POA: Diagnosis not present

## 2016-07-12 DIAGNOSIS — F3160 Bipolar disorder, current episode mixed, unspecified: Secondary | ICD-10-CM | POA: Diagnosis not present

## 2016-07-12 DIAGNOSIS — M5000 Cervical disc disorder with myelopathy, unspecified cervical region: Secondary | ICD-10-CM | POA: Diagnosis not present

## 2016-07-12 DIAGNOSIS — M159 Polyosteoarthritis, unspecified: Secondary | ICD-10-CM | POA: Diagnosis not present

## 2016-07-12 DIAGNOSIS — Z0001 Encounter for general adult medical examination with abnormal findings: Secondary | ICD-10-CM | POA: Diagnosis not present

## 2016-07-18 DIAGNOSIS — F319 Bipolar disorder, unspecified: Secondary | ICD-10-CM | POA: Diagnosis not present

## 2016-08-09 DIAGNOSIS — D485 Neoplasm of uncertain behavior of skin: Secondary | ICD-10-CM | POA: Diagnosis not present

## 2016-08-09 DIAGNOSIS — L821 Other seborrheic keratosis: Secondary | ICD-10-CM | POA: Diagnosis not present

## 2016-08-16 DIAGNOSIS — Z0001 Encounter for general adult medical examination with abnormal findings: Secondary | ICD-10-CM | POA: Diagnosis not present

## 2016-08-16 DIAGNOSIS — M159 Polyosteoarthritis, unspecified: Secondary | ICD-10-CM | POA: Diagnosis not present

## 2016-08-16 DIAGNOSIS — F3160 Bipolar disorder, current episode mixed, unspecified: Secondary | ICD-10-CM | POA: Diagnosis not present

## 2016-08-16 DIAGNOSIS — M5000 Cervical disc disorder with myelopathy, unspecified cervical region: Secondary | ICD-10-CM | POA: Diagnosis not present

## 2016-09-13 DIAGNOSIS — M159 Polyosteoarthritis, unspecified: Secondary | ICD-10-CM | POA: Diagnosis not present

## 2016-09-13 DIAGNOSIS — Z79891 Long term (current) use of opiate analgesic: Secondary | ICD-10-CM | POA: Diagnosis not present

## 2016-09-13 DIAGNOSIS — M5000 Cervical disc disorder with myelopathy, unspecified cervical region: Secondary | ICD-10-CM | POA: Diagnosis not present

## 2016-09-13 DIAGNOSIS — F3160 Bipolar disorder, current episode mixed, unspecified: Secondary | ICD-10-CM | POA: Diagnosis not present

## 2016-10-04 DIAGNOSIS — F319 Bipolar disorder, unspecified: Secondary | ICD-10-CM | POA: Diagnosis not present

## 2016-11-01 DIAGNOSIS — N959 Unspecified menopausal and perimenopausal disorder: Secondary | ICD-10-CM | POA: Diagnosis not present

## 2016-11-01 DIAGNOSIS — R5383 Other fatigue: Secondary | ICD-10-CM | POA: Diagnosis not present

## 2016-11-14 DIAGNOSIS — Z79891 Long term (current) use of opiate analgesic: Secondary | ICD-10-CM | POA: Diagnosis not present

## 2017-02-13 DIAGNOSIS — F319 Bipolar disorder, unspecified: Secondary | ICD-10-CM | POA: Diagnosis not present

## 2017-03-28 DIAGNOSIS — F3181 Bipolar II disorder: Secondary | ICD-10-CM | POA: Diagnosis not present

## 2017-04-02 DIAGNOSIS — F3181 Bipolar II disorder: Secondary | ICD-10-CM | POA: Diagnosis not present

## 2017-04-26 DIAGNOSIS — Z79891 Long term (current) use of opiate analgesic: Secondary | ICD-10-CM | POA: Diagnosis not present

## 2017-04-26 DIAGNOSIS — F3181 Bipolar II disorder: Secondary | ICD-10-CM | POA: Diagnosis not present

## 2017-05-04 DIAGNOSIS — Z1231 Encounter for screening mammogram for malignant neoplasm of breast: Secondary | ICD-10-CM | POA: Diagnosis not present

## 2017-05-04 DIAGNOSIS — F319 Bipolar disorder, unspecified: Secondary | ICD-10-CM | POA: Diagnosis not present

## 2017-05-04 DIAGNOSIS — R7309 Other abnormal glucose: Secondary | ICD-10-CM | POA: Diagnosis not present

## 2017-05-04 DIAGNOSIS — Z87891 Personal history of nicotine dependence: Secondary | ICD-10-CM | POA: Diagnosis not present

## 2017-05-04 DIAGNOSIS — I341 Nonrheumatic mitral (valve) prolapse: Secondary | ICD-10-CM | POA: Diagnosis not present

## 2017-05-04 DIAGNOSIS — I10 Essential (primary) hypertension: Secondary | ICD-10-CM | POA: Diagnosis not present

## 2017-05-30 DIAGNOSIS — F3181 Bipolar II disorder: Secondary | ICD-10-CM | POA: Diagnosis not present

## 2017-09-27 DIAGNOSIS — M25561 Pain in right knee: Secondary | ICD-10-CM | POA: Diagnosis not present

## 2017-09-27 DIAGNOSIS — G8929 Other chronic pain: Secondary | ICD-10-CM | POA: Diagnosis not present

## 2017-09-27 DIAGNOSIS — M545 Low back pain: Secondary | ICD-10-CM | POA: Diagnosis not present

## 2017-09-27 DIAGNOSIS — Z720 Tobacco use: Secondary | ICD-10-CM | POA: Diagnosis not present

## 2017-10-08 DIAGNOSIS — M179 Osteoarthritis of knee, unspecified: Secondary | ICD-10-CM | POA: Diagnosis not present

## 2017-10-08 DIAGNOSIS — M5136 Other intervertebral disc degeneration, lumbar region: Secondary | ICD-10-CM | POA: Diagnosis not present

## 2017-10-08 DIAGNOSIS — G894 Chronic pain syndrome: Secondary | ICD-10-CM | POA: Diagnosis not present

## 2017-10-08 DIAGNOSIS — Z5181 Encounter for therapeutic drug level monitoring: Secondary | ICD-10-CM | POA: Diagnosis not present

## 2017-10-15 DIAGNOSIS — M545 Low back pain: Secondary | ICD-10-CM | POA: Diagnosis not present

## 2017-10-15 DIAGNOSIS — M25551 Pain in right hip: Secondary | ICD-10-CM | POA: Diagnosis not present

## 2017-10-15 DIAGNOSIS — M25561 Pain in right knee: Secondary | ICD-10-CM | POA: Diagnosis not present

## 2017-10-23 DIAGNOSIS — G894 Chronic pain syndrome: Secondary | ICD-10-CM | POA: Diagnosis not present

## 2017-10-23 DIAGNOSIS — M5136 Other intervertebral disc degeneration, lumbar region: Secondary | ICD-10-CM | POA: Diagnosis not present

## 2017-10-23 DIAGNOSIS — M179 Osteoarthritis of knee, unspecified: Secondary | ICD-10-CM | POA: Diagnosis not present

## 2017-10-23 DIAGNOSIS — Z5181 Encounter for therapeutic drug level monitoring: Secondary | ICD-10-CM | POA: Diagnosis not present

## 2017-10-25 DIAGNOSIS — G4700 Insomnia, unspecified: Secondary | ICD-10-CM | POA: Diagnosis not present

## 2017-10-25 DIAGNOSIS — F430 Acute stress reaction: Secondary | ICD-10-CM | POA: Diagnosis not present

## 2017-10-25 DIAGNOSIS — K219 Gastro-esophageal reflux disease without esophagitis: Secondary | ICD-10-CM | POA: Diagnosis not present

## 2017-10-25 DIAGNOSIS — R51 Headache: Secondary | ICD-10-CM | POA: Diagnosis not present

## 2017-10-26 DIAGNOSIS — F411 Generalized anxiety disorder: Secondary | ICD-10-CM | POA: Diagnosis not present

## 2017-10-26 DIAGNOSIS — F3181 Bipolar II disorder: Secondary | ICD-10-CM | POA: Diagnosis not present

## 2017-10-29 DIAGNOSIS — M545 Low back pain: Secondary | ICD-10-CM | POA: Diagnosis not present

## 2017-11-14 DIAGNOSIS — F310 Bipolar disorder, current episode hypomanic: Secondary | ICD-10-CM | POA: Diagnosis not present

## 2017-11-21 DIAGNOSIS — M5136 Other intervertebral disc degeneration, lumbar region: Secondary | ICD-10-CM | POA: Diagnosis not present

## 2017-11-21 DIAGNOSIS — Z5181 Encounter for therapeutic drug level monitoring: Secondary | ICD-10-CM | POA: Diagnosis not present

## 2017-11-21 DIAGNOSIS — M179 Osteoarthritis of knee, unspecified: Secondary | ICD-10-CM | POA: Diagnosis not present

## 2017-11-21 DIAGNOSIS — G894 Chronic pain syndrome: Secondary | ICD-10-CM | POA: Diagnosis not present

## 2017-12-06 DIAGNOSIS — F3181 Bipolar II disorder: Secondary | ICD-10-CM | POA: Diagnosis not present

## 2017-12-13 DIAGNOSIS — J00 Acute nasopharyngitis [common cold]: Secondary | ICD-10-CM | POA: Diagnosis not present

## 2017-12-18 DIAGNOSIS — G894 Chronic pain syndrome: Secondary | ICD-10-CM | POA: Diagnosis not present

## 2017-12-18 DIAGNOSIS — M179 Osteoarthritis of knee, unspecified: Secondary | ICD-10-CM | POA: Diagnosis not present

## 2017-12-18 DIAGNOSIS — Z5181 Encounter for therapeutic drug level monitoring: Secondary | ICD-10-CM | POA: Diagnosis not present

## 2017-12-18 DIAGNOSIS — M5136 Other intervertebral disc degeneration, lumbar region: Secondary | ICD-10-CM | POA: Diagnosis not present

## 2018-01-01 DIAGNOSIS — F17200 Nicotine dependence, unspecified, uncomplicated: Secondary | ICD-10-CM | POA: Diagnosis not present

## 2018-01-01 DIAGNOSIS — F310 Bipolar disorder, current episode hypomanic: Secondary | ICD-10-CM | POA: Diagnosis not present

## 2018-01-01 DIAGNOSIS — F4310 Post-traumatic stress disorder, unspecified: Secondary | ICD-10-CM | POA: Diagnosis not present

## 2018-01-07 DIAGNOSIS — J22 Unspecified acute lower respiratory infection: Secondary | ICD-10-CM | POA: Diagnosis not present

## 2018-01-10 DIAGNOSIS — F3181 Bipolar II disorder: Secondary | ICD-10-CM | POA: Diagnosis not present

## 2018-01-15 DIAGNOSIS — G894 Chronic pain syndrome: Secondary | ICD-10-CM | POA: Diagnosis not present

## 2018-01-15 DIAGNOSIS — Z5181 Encounter for therapeutic drug level monitoring: Secondary | ICD-10-CM | POA: Diagnosis not present

## 2018-01-15 DIAGNOSIS — M5136 Other intervertebral disc degeneration, lumbar region: Secondary | ICD-10-CM | POA: Diagnosis not present

## 2018-01-15 DIAGNOSIS — M179 Osteoarthritis of knee, unspecified: Secondary | ICD-10-CM | POA: Diagnosis not present

## 2018-01-23 DIAGNOSIS — F17200 Nicotine dependence, unspecified, uncomplicated: Secondary | ICD-10-CM | POA: Diagnosis not present

## 2018-01-23 DIAGNOSIS — F4310 Post-traumatic stress disorder, unspecified: Secondary | ICD-10-CM | POA: Diagnosis not present

## 2018-01-23 DIAGNOSIS — F310 Bipolar disorder, current episode hypomanic: Secondary | ICD-10-CM | POA: Diagnosis not present

## 2018-02-12 DIAGNOSIS — M179 Osteoarthritis of knee, unspecified: Secondary | ICD-10-CM | POA: Diagnosis not present

## 2018-02-12 DIAGNOSIS — Z5181 Encounter for therapeutic drug level monitoring: Secondary | ICD-10-CM | POA: Diagnosis not present

## 2018-02-12 DIAGNOSIS — G894 Chronic pain syndrome: Secondary | ICD-10-CM | POA: Diagnosis not present

## 2018-02-12 DIAGNOSIS — M5136 Other intervertebral disc degeneration, lumbar region: Secondary | ICD-10-CM | POA: Diagnosis not present

## 2018-02-19 DIAGNOSIS — F4310 Post-traumatic stress disorder, unspecified: Secondary | ICD-10-CM | POA: Diagnosis not present

## 2018-02-19 DIAGNOSIS — F310 Bipolar disorder, current episode hypomanic: Secondary | ICD-10-CM | POA: Diagnosis not present

## 2018-02-19 DIAGNOSIS — F17200 Nicotine dependence, unspecified, uncomplicated: Secondary | ICD-10-CM | POA: Diagnosis not present

## 2018-03-05 DIAGNOSIS — M542 Cervicalgia: Secondary | ICD-10-CM | POA: Diagnosis not present

## 2018-03-05 DIAGNOSIS — M549 Dorsalgia, unspecified: Secondary | ICD-10-CM | POA: Diagnosis not present

## 2018-03-12 DIAGNOSIS — M25512 Pain in left shoulder: Secondary | ICD-10-CM | POA: Diagnosis not present

## 2018-03-12 DIAGNOSIS — M542 Cervicalgia: Secondary | ICD-10-CM | POA: Diagnosis not present

## 2018-03-12 DIAGNOSIS — M545 Low back pain: Secondary | ICD-10-CM | POA: Diagnosis not present

## 2018-03-18 DIAGNOSIS — Z5181 Encounter for therapeutic drug level monitoring: Secondary | ICD-10-CM | POA: Diagnosis not present

## 2018-03-18 DIAGNOSIS — M5136 Other intervertebral disc degeneration, lumbar region: Secondary | ICD-10-CM | POA: Diagnosis not present

## 2018-03-18 DIAGNOSIS — M179 Osteoarthritis of knee, unspecified: Secondary | ICD-10-CM | POA: Diagnosis not present

## 2018-03-18 DIAGNOSIS — G894 Chronic pain syndrome: Secondary | ICD-10-CM | POA: Diagnosis not present

## 2018-04-01 ENCOUNTER — Ambulatory Visit (INDEPENDENT_AMBULATORY_CARE_PROVIDER_SITE_OTHER): Payer: Medicare Other | Admitting: Nurse Practitioner

## 2018-04-01 DIAGNOSIS — L03818 Cellulitis of other sites: Secondary | ICD-10-CM

## 2018-04-01 MED ORDER — CEPHALEXIN 500 MG PO CAPS: 500.0000 mg | ORAL_CAPSULE | Freq: Three times a day (TID) | ORAL | 0 refills | Status: AC

## 2018-04-01 NOTE — Progress Notes (Signed)
  Subjective:     Patient ID: Nichole Gibbs , female    DOB: [birth year] , 62 y.o.   MRN: [id number]   Here today due to having a bump to the middle of her breast over the last week.  She was having soreness to the area.  Opened last pm.        Past Medical History:  Diagnosis Date  . Anemia   . Anxiety   . Bipolar 1 disorder ([place])   . Chest pain    denies at this time 10/7  . Depression   . GERD (gastroesophageal reflux disease)   . Hypertension   . Memory loss    pt states mild memory loss  . Mitral valve prolapse    cardiologist-   dr Burt Knack  . Moderate mitral regurgitation   . Osteoarthritis   . Right patella fracture   . Rotator cuff syndrome    right side      Current Outpatient Medications:  .  amLODipine (NORVASC) 5 MG tablet, Take 5 mg by mouth daily., Disp: , Rfl: 1 .  clonazePAM (KLONOPIN) 1 MG tablet, Take 1 mg by mouth 4 (four) times daily as needed for anxiety. , Disp: , Rfl: 3 .  fluticasone (FLONASE) 50 MCG/ACT nasal spray, Place 1 spray into both nostrils daily., Disp: , Rfl:  .  gabapentin (NEURONTIN) 300 MG capsule, Take 1 capsule (300 mg total) by mouth 3 (three) times daily., Disp: 90 capsule, Rfl: 0 .  HYDROcodone-acetaminophen (NORCO) 10-325 MG tablet, Take 1 tablet by mouth 4 (four) times daily as needed for moderate pain. , Disp: , Rfl: 0 .  lamoTRIgine (LAMICTAL) 150 MG tablet, Take 1 tablet (150 mg total) by mouth at bedtime., Disp: 30 tablet, Rfl: 0 .  MAGNESIUM PO, Take 1 tablet by mouth every evening., Disp: , Rfl:  .  pantoprazole (PROTONIX) 40 MG tablet, Take 1 tablet (40 mg total) by mouth daily., Disp: 30 tablet, Rfl: 0   Review of Systems  Constitutional: Negative.   HENT: Negative.   Eyes: Negative.   Respiratory: Negative.   Cardiovascular: Negative.   Musculoskeletal: Negative.   Skin: Positive for wound.       Between breasts.       Objective:  Physical Exam  Constitutional: She is oriented to person, place, and time.  She appears well-developed and well-nourished.  Cardiovascular: Normal rate, regular rhythm, normal heart sounds and intact distal pulses.  Pulmonary/Chest: Effort normal and breath sounds normal. She exhibits no tenderness.  Neurological: She is alert and oriented to person, place, and time.  Skin: Skin is warm and dry. No rash noted.           Assessment And Plan:     1. Cellulitis of other specified site  Erythematous vesicle, firm.  Able to get small amount of exudate and sent for wound culture.    Will treat with cephalexin  Warm compresses 2-3 times per day  Clean with soap and water.  - cephALEXin (KEFLEX) 500 MG capsule; Take 1 capsule (500 mg total) by mouth 3 (three) times daily for 10 days.  Dispense: 30 capsule; Refill: 0 - Culture, Wound   Minette Brine, FNP

## 2018-04-01 NOTE — Patient Instructions (Addendum)
Cellulitis, Adult Cellulitis is a skin infection. The infected area is usually red and sore. This condition occurs most often in the arms and lower legs. It is very important to get treated for this condition. Follow these instructions at home:  Take over-the-counter and prescription medicines only as told by your doctor.  If you were prescribed an antibiotic medicine, take it as told by your doctor. Do not stop taking the antibiotic even if you start to feel better.  Drink enough fluid to keep your pee (urine) clear or pale yellow.  Do not touch or rub the infected area.  Raise (elevate) the infected area above the level of your heart while you are sitting or lying down.  Place warm or cold wet cloths (warm or cold compresses) on the infected area. Do this as told by your doctor.  Keep all follow-up visits as told by your doctor. This is important. These visits let your doctor make sure your infection is not getting worse. Contact a doctor if:  You have a fever.  Your symptoms do not get better after 1-2 days of treatment.  Your bone or joint under the infected area starts to hurt after the skin has healed.  Your infection comes back. This can happen in the same area or another area.  You have a swollen bump in the infected area.  You have new symptoms.  You feel ill and also have muscle aches and pains. Get help right away if:  Your symptoms get worse.  You feel very sleepy.  You throw up (vomit) or have watery poop (diarrhea) for a long time.  There are red streaks coming from the infected area.  Your red area gets larger.  Your red area turns darker. This information is not intended to replace advice given to you by your health care provider. Make sure you discuss any questions you have with your health care provider. Document Released: 11/29/2007 Document Revised: 11/18/2015 Document Reviewed: 04/21/2015 Elsevier Interactive Patient Education  2018 [place].    Warm compresses 3-4 times per day or can let warm water from shower run over  Take antibiotic until completely gone.  Cleanse with soap and water, apply neosporin and apply bandaid.

## 2018-04-03 ENCOUNTER — Telehealth: Payer: Self-pay | Admitting: Nurse Practitioner

## 2018-04-03 NOTE — Telephone Encounter (Signed)
I called the patient to schedule her AWV-S w/ [place] after 05/05/18 (Last AWV 05/03/17).  She said that she will call back tomorrow when she has her calendar with her. VDM (DD)

## 2018-04-04 LAB — WOUND CULTURE: ORGANISM ID, BACTERIA: NONE SEEN

## 2018-04-06 ENCOUNTER — Other Ambulatory Visit: Payer: Self-pay | Admitting: Nurse Practitioner

## 2018-04-15 DIAGNOSIS — M5136 Other intervertebral disc degeneration, lumbar region: Secondary | ICD-10-CM | POA: Diagnosis not present

## 2018-04-15 DIAGNOSIS — G894 Chronic pain syndrome: Secondary | ICD-10-CM | POA: Diagnosis not present

## 2018-04-15 DIAGNOSIS — Z5181 Encounter for therapeutic drug level monitoring: Secondary | ICD-10-CM | POA: Diagnosis not present

## 2018-04-15 DIAGNOSIS — M179 Osteoarthritis of knee, unspecified: Secondary | ICD-10-CM | POA: Diagnosis not present

## 2018-04-16 NOTE — Telephone Encounter (Signed)
I left a message letting the patient know that her yearly Medicare questionnaire has been scheduled on 05/08/18.  VDM (DD)

## 2018-04-23 DIAGNOSIS — F4310 Post-traumatic stress disorder, unspecified: Secondary | ICD-10-CM | POA: Diagnosis not present

## 2018-04-23 DIAGNOSIS — F17200 Nicotine dependence, unspecified, uncomplicated: Secondary | ICD-10-CM | POA: Diagnosis not present

## 2018-04-23 DIAGNOSIS — F310 Bipolar disorder, current episode hypomanic: Secondary | ICD-10-CM | POA: Diagnosis not present

## 2018-04-24 ENCOUNTER — Encounter: Payer: Self-pay | Admitting: Nurse Practitioner

## 2018-04-24 ENCOUNTER — Ambulatory Visit (INDEPENDENT_AMBULATORY_CARE_PROVIDER_SITE_OTHER): Payer: Medicare Other | Admitting: Nurse Practitioner

## 2018-04-24 VITALS — BP 122/70 | HR 75 | Temp 98.5°F | Ht 62.0 in | Wt 136.8 lb

## 2018-04-24 DIAGNOSIS — F329 Major depressive disorder, single episode, unspecified: Secondary | ICD-10-CM | POA: Diagnosis not present

## 2018-04-24 DIAGNOSIS — J329 Chronic sinusitis, unspecified: Secondary | ICD-10-CM | POA: Diagnosis not present

## 2018-04-24 DIAGNOSIS — R0789 Other chest pain: Secondary | ICD-10-CM

## 2018-04-24 DIAGNOSIS — F32A Depression, unspecified: Secondary | ICD-10-CM

## 2018-04-24 MED ORDER — AZELASTINE HCL 137 MCG/SPRAY NA SOLN: 2.0000 | Freq: Two times a day (BID) | NASAL | 3 refills | Status: AC

## 2018-04-24 MED ORDER — AMOXICILLIN-POT CLAVULANATE 875-125 MG PO TABS: 1.0000 | ORAL_TABLET | Freq: Two times a day (BID) | ORAL | 0 refills | Status: AC

## 2018-04-24 MED ORDER — ALBUTEROL SULFATE HFA 108 (90 BASE) MCG/ACT IN AERS: 2.0000 | INHALATION_SPRAY | Freq: Four times a day (QID) | RESPIRATORY_TRACT | 2 refills | Status: DC | PRN
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
## 2018-05-02 DIAGNOSIS — M179 Osteoarthritis of knee, unspecified: Secondary | ICD-10-CM | POA: Diagnosis not present

## 2018-05-02 DIAGNOSIS — Z5181 Encounter for therapeutic drug level monitoring: Secondary | ICD-10-CM | POA: Diagnosis not present

## 2018-05-02 DIAGNOSIS — G894 Chronic pain syndrome: Secondary | ICD-10-CM | POA: Diagnosis not present

## 2018-05-02 DIAGNOSIS — M5136 Other intervertebral disc degeneration, lumbar region: Secondary | ICD-10-CM | POA: Diagnosis not present

## 2018-05-06 DIAGNOSIS — K219 Gastro-esophageal reflux disease without esophagitis: Secondary | ICD-10-CM | POA: Diagnosis not present

## 2018-05-06 DIAGNOSIS — R196 Halitosis: Secondary | ICD-10-CM | POA: Diagnosis not present

## 2018-05-06 DIAGNOSIS — J329 Chronic sinusitis, unspecified: Secondary | ICD-10-CM | POA: Diagnosis not present

## 2018-05-06 DIAGNOSIS — R0981 Nasal congestion: Secondary | ICD-10-CM | POA: Diagnosis not present

## 2018-05-06 DIAGNOSIS — F314 Bipolar disorder, current episode depressed, severe, without psychotic features: Secondary | ICD-10-CM | POA: Diagnosis not present

## 2018-05-06 DIAGNOSIS — J310 Chronic rhinitis: Secondary | ICD-10-CM | POA: Diagnosis not present

## 2018-05-08 ENCOUNTER — Ambulatory Visit (INDEPENDENT_AMBULATORY_CARE_PROVIDER_SITE_OTHER): Payer: Medicare Other

## 2018-05-08 VITALS — BP 124/70 | HR 77 | Temp 98.1°F | Ht 62.0 in | Wt 134.2 lb

## 2018-05-08 DIAGNOSIS — Z1239 Encounter for other screening for malignant neoplasm of breast: Secondary | ICD-10-CM

## 2018-05-08 DIAGNOSIS — Z0000 Encounter for general adult medical examination without abnormal findings: Secondary | ICD-10-CM | POA: Diagnosis not present

## 2018-05-08 DIAGNOSIS — Z23 Encounter for immunization: Secondary | ICD-10-CM | POA: Diagnosis not present
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
## 2018-05-13 DIAGNOSIS — Z981 Arthrodesis status: Secondary | ICD-10-CM | POA: Diagnosis not present

## 2018-05-13 DIAGNOSIS — M542 Cervicalgia: Secondary | ICD-10-CM | POA: Diagnosis not present

## 2018-05-13 DIAGNOSIS — M549 Dorsalgia, unspecified: Secondary | ICD-10-CM | POA: Diagnosis not present

## 2018-05-14 DIAGNOSIS — F314 Bipolar disorder, current episode depressed, severe, without psychotic features: Secondary | ICD-10-CM | POA: Diagnosis not present

## 2018-05-15 ENCOUNTER — Encounter (HOSPITAL_COMMUNITY): Payer: Self-pay | Admitting: Emergency Medicine

## 2018-05-15 ENCOUNTER — Emergency Department (HOSPITAL_COMMUNITY): Payer: Medicare Other

## 2018-05-15 ENCOUNTER — Emergency Department (HOSPITAL_COMMUNITY)
Admission: EM | Admit: 2018-05-15 | Discharge: 2018-05-15 | Disposition: A | Discharge status: Home / Self Care | Payer: Medicare Other | Attending: Emergency Medicine | Admitting: Emergency Medicine

## 2018-05-15 DIAGNOSIS — R079 Chest pain, unspecified: Secondary | ICD-10-CM | POA: Diagnosis not present

## 2018-05-15 DIAGNOSIS — I959 Hypotension, unspecified: Secondary | ICD-10-CM | POA: Diagnosis not present

## 2018-05-15 DIAGNOSIS — R0789 Other chest pain: Secondary | ICD-10-CM | POA: Diagnosis not present

## 2018-05-15 DIAGNOSIS — R072 Precordial pain: Secondary | ICD-10-CM | POA: Diagnosis not present

## 2018-05-15 DIAGNOSIS — R0602 Shortness of breath: Secondary | ICD-10-CM | POA: Diagnosis not present

## 2018-05-15 DIAGNOSIS — F17210 Nicotine dependence, cigarettes, uncomplicated: Secondary | ICD-10-CM | POA: Insufficient documentation

## 2018-05-15 DIAGNOSIS — R0902 Hypoxemia: Secondary | ICD-10-CM | POA: Diagnosis not present

## 2018-05-15 DIAGNOSIS — Z79899 Other long term (current) drug therapy: Secondary | ICD-10-CM | POA: Diagnosis not present

## 2018-05-15 DIAGNOSIS — I10 Essential (primary) hypertension: Secondary | ICD-10-CM | POA: Diagnosis not present

## 2018-05-15 LAB — BASIC METABOLIC PANEL
ANION GAP: 9 (ref 5–15)
BUN: 19 mg/dL (ref 6–20)
CALCIUM: 9.6 mg/dL (ref 8.9–10.3)
CHLORIDE: 107 mmol/L (ref 98–111)
CO2: 21 mmol/L — AB (ref 22–32)
CREATININE: 0.82 mg/dL (ref 0.44–1.00)
GFR calc Af Amer: >60 mL/min (ref >60)
GFR calc non Af Amer: >60 mL/min (ref >60)
Glucose, Bld: 90 mg/dL (ref 70–99)
Potassium: 4.2 mmol/L (ref 3.5–5.1)
Sodium: 137 mmol/L (ref 135–145)

## 2018-05-15 LAB — CBC
HEMATOCRIT: 37.9 % (ref 36.0–46.0)
Hemoglobin: 11.8 g/dL — ABNORMAL LOW (ref 12.0–15.0)
MCH: 27.9 pg (ref 26.0–34.0)
MCHC: 31.1 g/dL (ref 30.0–36.0)
MCV: 89.6 fL (ref 80.0–100.0)
PLATELETS: 278 10*3/uL (ref 150–400)
RBC: 4.23 MIL/uL (ref 3.87–5.11)
RDW: 14.7 % (ref 11.5–15.5)
WBC: 9.2 10*3/uL (ref 4.0–10.5)
nRBC: 0 % (ref 0.0–0.2)

## 2018-05-15 LAB — I-STAT TROPONIN, ED
Troponin i, poc: 0 ng/mL (ref 0.00–0.08)
Troponin i, poc: 0 ng/mL (ref 0.00–0.08)

## 2018-05-15 MED ORDER — NITROGLYCERIN 0.4 MG SL SUBL: 0.4000 mg | SUBLINGUAL_TABLET | SUBLINGUAL | Status: DC | PRN

## 2018-05-15 NOTE — ED Provider Notes (Signed)
[facility] EMERGENCY DEPARTMENT Provider Note   CSN: [id number] Arrival date & time: 05/15/18  1047     History   Chief Complaint Chief Complaint  Patient presents with  . Chest Pain    HPI Nichole Gibbs is a 62 y.o. female who presents with chest pain/SOB. PMH significant for HTN, moderate mitral regurgitation, bipolar d/o, GERD. She states that she woke up early this morning with pain in her "side". She took an ASA and this eased off and she went back to sleep. Then she woke up again with severe back pain and then substernal chest pain with associated SOB. She tried to move around to get ready for work and this helped the pain but it did not go away. It was constant but would become severe at times and then ease off randomly. It would radiate to her neck at times. She decided to call EMS who gave her 2 nitro and this helped a lot. She currently has a residual soreness in the substernal area. She reports her mother had CAD when she was younger and her father had a stroke and died in his 58s. She has been under a lot of stress. No fever, cough, abdominal pain, leg swelling. She previously followed with cardiology but hasn't seen them in several years. Last echo showed EF 55%. Triage note mentions hx of MI which I do not see this in chart review. Had a low risk ST in 2014.   HPI  Past Medical History:  Diagnosis Date  . Anemia   . Anxiety   . Bipolar 1 disorder ([place])   . Chest pain    denies at this time 10/7  . Depression   . GERD (gastroesophageal reflux disease)   . Hypertension   . Memory loss    pt states mild memory loss  . Mitral valve prolapse    cardiologist-   dr Burt Knack  . Moderate mitral regurgitation   . Osteoarthritis   . Right patella fracture   . Rotator cuff syndrome    right side    Patient Active Problem List   Diagnosis Date Noted  . Paranoid ([place])   . Major depressive disorder, recurrent, severe without psychotic features  ([place])   . Severe recurrent major depressive disorder with psychotic features ([place]) 01/07/2015  . Chronic pain syndrome 01/07/2015  . S/P knee surgery 04/03/2014  . Unspecified constipation 10/14/2013  . Halitosis 10/14/2013  . MITRAL REGURGITATION 04/15/2009  . Essential hypertension 04/15/2009  . MITRAL VALVE PROLAPSE, HX OF 04/15/2009  . Bipolar I disorder ([place]) 10/27/2006  . Anxiety state 10/27/2006  . TOBACCO ABUSE 10/27/2006  . GERD 10/27/2006  . OSTEOARTHRITIS, HANDS, BILATERAL 10/27/2006  . SYNDROME, ROTATOR CUFF NOS 10/27/2006    Past Surgical History:  Procedure Laterality Date  . ANTERIOR CERVICAL DECOMP/DISCECTOMY FUSION  05-19-2009   C4 --  C7  and C5 corpectomy  . CARDIOVASCULAR STRESS TEST  11-20-2012  dr cooper   normal perfusion study/  no ischemia/  ef 62%  . CAUTERIZATION POST TONSILLECTOMY  09-25-2003  . CESAREAN SECTION    . PATELLECTOMY Right 04/03/2014   Procedure: PATELLECTOMY;  Surgeon: Sydnee Cabal, MD;  Location: [facility];  Service: Orthopedics;  Laterality: Right;  . RE-EXPLORATION ANTERIOR CERVICAL WOUND AND EVACUATION HEMATOMA  05-19-2009  . TONSILLECTOMY  09-07-2003  . TRANSTHORACIC ECHOCARDIOGRAM  01-07-2014   grade II diastolic dysfunction/  ef 55-60%/  mild late systolic mitral valve prolapse of anterior leaflet/  moderate MR     OB History   None      Home Medications    Prior to Admission medications   Medication Sig Start Date End Date Taking? Authorizing Provider  albuterol (PROVENTIL HFA;VENTOLIN HFA) 108 (90 Base) MCG/ACT inhaler Inhale 2 puffs into the lungs every 6 (six) hours as needed for wheezing or shortness of breath. 04/24/18   Minette Brine, FNP  amLODipine (NORVASC) 5 MG tablet Take 5 mg by mouth daily. 02/15/15   [provider]  Azelastine HCl 137 MCG/SPRAY SOLN Place 2 sprays into the nose 2 (two) times daily. 04/24/18   Minette Brine, FNP  buPROPion (WELLBUTRIN XL) 150 MG 24 hr tablet TK 1 T  PO QAM 04/06/18   [provider]  clonazePAM (KLONOPIN) 1 MG tablet Take 1 mg by mouth 4 (four) times daily as needed for anxiety.  04/17/15   [provider]  fluticasone (FLONASE) 50 MCG/ACT nasal spray SHAKE LIQUID AND USE 1 SPRAY IN EACH NOSTRIL EVERY DAY 04/10/18   Minette Brine, FNP  lamoTRIgine (LAMICTAL) 150 MG tablet lamotrigine 150 mg tablet    [provider]  pantoprazole (PROTONIX) 40 MG tablet Take 1 tablet (40 mg total) by mouth daily. Patient taking differently: Take 80 mg by mouth daily.  01/13/15   Kerrie Buffalo, NP    Family History Family History  Problem Relation Age of Onset  . Heart disease Mother   . Heart attack Father   . Colon polyps Father   . Skin cancer Father   . Esophageal cancer Maternal Uncle   . Other Maternal Aunt        stomcah polyps  . Bone cancer Paternal Grandfather   . Uterine cancer Paternal Grandmother   . Liver cancer Paternal Uncle   . Cirrhosis Paternal Uncle     Social History Social History   Tobacco Use  . Smoking status: Current Every Day Smoker    Packs/day: 0.50    Years: 30.00    Pack years: 15.00    Types: Cigarettes  . Smokeless tobacco: Never Used  Substance Use Topics  . Alcohol use: Not Currently  . Drug use: No     Allergies   Tizanidine; Acetaminophen; Ibuprofen; and Lactose intolerance (gi)   Review of Systems Review of Systems  Constitutional: Negative for fever.  Respiratory: Positive for shortness of breath. Negative for cough and wheezing.   Cardiovascular: Positive for chest pain. Negative for palpitations and leg swelling.  Gastrointestinal: Negative for abdominal pain, nausea and vomiting.  Musculoskeletal: Positive for neck pain.  Neurological: Negative for syncope.  All other systems reviewed and are negative.    Physical Exam Updated Vital Signs BP (!) 150/68   Pulse 70   Temp 99.1 F (37.3 C) (Oral)   Resp 14   Ht 5\' 2"  (1.575 m)   Wt 61.2 kg   SpO2  99%   BMI 24.69 kg/m   Physical Exam  Constitutional: She is oriented to person, place, and time. She appears well-developed and well-nourished. No distress.  Calm and cooperative  HENT:  Head: Normocephalic and atraumatic.  Eyes: Pupils are equal, round, and reactive to light. Conjunctivae are normal. Right eye exhibits no discharge. Left eye exhibits no discharge. No scleral icterus.  Neck: Normal range of motion.  Cardiovascular: Normal rate and regular rhythm.  Murmur heard. Pulmonary/Chest: Effort normal and breath sounds normal. No respiratory distress.  Abdominal: Soft. Bowel sounds are normal. She exhibits no distension. There is  no tenderness.  Musculoskeletal:  No peripheral edema  Neurological: She is alert and oriented to person, place, and time.  Skin: Skin is warm and dry.  Psychiatric: She has a normal mood and affect. Her behavior is normal.  Nursing note and vitals reviewed.    ED Treatments / Results  Labs (all labs ordered are listed, but only abnormal results are displayed) Labs Reviewed  BASIC METABOLIC PANEL - Abnormal; Notable for the following components:      Result Value   CO2 21 (*)    All other components within normal limits  CBC - Abnormal; Notable for the following components:   Hemoglobin 11.8 (*)    All other components within normal limits  I-STAT TROPONIN, ED    EKG EKG Interpretation  Date/Time:  Wednesday May 15 2018 10:53:16 EST Ventricular Rate:  80 PR Interval:    QRS Duration: 93 QT Interval:  378 QTC Calculation: 436 R Axis:   75 Text Interpretation:  Sinus rhythm Left atrial enlargement Probable left ventricular hypertrophy No significant change since last tracing Confirmed by Theotis Burrow [phone]) on 05/15/2018 1:36:19 PM   Radiology Dg Chest 2 View  Result Date: 05/15/2018 CLINICAL DATA:  Chest pain EXAM: CHEST - 2 VIEW COMPARISON:  June 12, 2014 FINDINGS: There is no edema or consolidation. There is mild  bibasilar atelectasis. Heart is borderline enlarged with pulmonary vascularity normal. No adenopathy. There is aortic atherosclerosis. There is postoperative change in the lower cervical region. IMPRESSION: No edema or consolidation. Bibasilar atelectasis. Borderline cardiac enlargement. There is aortic atherosclerosis. Aortic Atherosclerosis (ICD10-I70.0). Electronically Signed   By: Lowella Grip III M.D.   On: 05/15/2018 11:44    Procedures Procedures (including critical care time)  Medications Ordered in ED Medications - No data to display   Initial Impression / Assessment and Plan / ED Course  I have reviewed the triage vital signs and the nursing notes.  Pertinent labs & imaging results that were available during my care of the patient were reviewed by me and considered in my medical decision making (see chart for details).  62 year old female presents with chest pain. There are typical and atypical features. The pain that she was having earlier is gone but she has a residual "soreness" over the substernal area and states that her neck still hurts. EKG is SR with probably LVH. CXR shows borderline cardiomegaly and aortic atherosclerosis. Labs are normal. 1st troponin is normal. Chest pain r/o was discussed with patient since HEART score is 4. She states she doesn't want to be admitted because she needs to get back to her dog. She is agreeable to delta troponin. She was strongly encouraged to f/u with Cardiology and was also given strict return precautions.  Final Clinical Impressions(s) / ED Diagnoses   Final diagnoses:  Chest pain, unspecified type  SOB (shortness of breath)    ED Discharge Orders    None       Recardo Evangelist, PA-C 05/15/18 [place], Wenda Overland, MD 05/16/18 [phone]

## 2018-05-15 NOTE — ED Triage Notes (Signed)
Per [facility]: Patient to ED c/o central CP radiating to back onset while walking about one hour ago. Patient history MI (over 10 years ago per patient) and anxiety. She reports also feeling short of breath and lightheaded. Given 2 SL NTG by EMS with relief. Patient currently denies pain, shortness of breath, dizziness, nausea. Resp e/u, skin warm/dry.   EMS VS: 180/90, 130/70 after nitro, HR 76 sinus rhythm; 20g. PIV L wrist

## 2018-05-15 NOTE — Discharge Instructions (Signed)
Please follow up with cardiology Return if you are worsening

## 2018-05-15 NOTE — ED Notes (Signed)
Patient provided with juice and graham crackers. Reports feeling better.

## 2018-05-15 NOTE — ED Notes (Signed)
Pt verbalized understanding of d/c instructions and has no further questions, VSS, NAD.  

## 2018-05-17 DIAGNOSIS — F411 Generalized anxiety disorder: Secondary | ICD-10-CM | POA: Diagnosis not present

## 2018-05-17 DIAGNOSIS — F3181 Bipolar II disorder: Secondary | ICD-10-CM | POA: Diagnosis not present

## 2018-05-19 ENCOUNTER — Observation Stay (HOSPITAL_COMMUNITY)
Admission: EM | Admit: 2018-05-19 | Discharge: 2018-05-20 | Disposition: A | Discharge status: Home / Self Care | Payer: Medicare Other | Attending: Internal Medicine | Admitting: Internal Medicine

## 2018-05-19 ENCOUNTER — Emergency Department (HOSPITAL_COMMUNITY): Payer: Medicare Other

## 2018-05-19 DIAGNOSIS — I420 Dilated cardiomyopathy: Secondary | ICD-10-CM | POA: Diagnosis not present

## 2018-05-19 DIAGNOSIS — R0602 Shortness of breath: Secondary | ICD-10-CM | POA: Diagnosis not present

## 2018-05-19 DIAGNOSIS — I051 Rheumatic mitral insufficiency: Secondary | ICD-10-CM | POA: Insufficient documentation

## 2018-05-19 DIAGNOSIS — Z9889 Other specified postprocedural states: Secondary | ICD-10-CM | POA: Insufficient documentation

## 2018-05-19 DIAGNOSIS — F319 Bipolar disorder, unspecified: Secondary | ICD-10-CM | POA: Insufficient documentation

## 2018-05-19 DIAGNOSIS — F17210 Nicotine dependence, cigarettes, uncomplicated: Secondary | ICD-10-CM | POA: Insufficient documentation

## 2018-05-19 DIAGNOSIS — Z7951 Long term (current) use of inhaled steroids: Secondary | ICD-10-CM | POA: Insufficient documentation

## 2018-05-19 DIAGNOSIS — Z79899 Other long term (current) drug therapy: Secondary | ICD-10-CM | POA: Insufficient documentation

## 2018-05-19 DIAGNOSIS — K219 Gastro-esophageal reflux disease without esophagitis: Secondary | ICD-10-CM | POA: Insufficient documentation

## 2018-05-19 DIAGNOSIS — Z886 Allergy status to analgesic agent status: Secondary | ICD-10-CM | POA: Insufficient documentation

## 2018-05-19 DIAGNOSIS — R0789 Other chest pain: Secondary | ICD-10-CM | POA: Diagnosis not present

## 2018-05-19 DIAGNOSIS — R42 Dizziness and giddiness: Secondary | ICD-10-CM | POA: Insufficient documentation

## 2018-05-19 DIAGNOSIS — E739 Lactose intolerance, unspecified: Secondary | ICD-10-CM | POA: Diagnosis not present

## 2018-05-19 DIAGNOSIS — Z981 Arthrodesis status: Secondary | ICD-10-CM | POA: Diagnosis not present

## 2018-05-19 DIAGNOSIS — M1990 Unspecified osteoarthritis, unspecified site: Secondary | ICD-10-CM | POA: Diagnosis not present

## 2018-05-19 DIAGNOSIS — Z8249 Family history of ischemic heart disease and other diseases of the circulatory system: Secondary | ICD-10-CM | POA: Diagnosis not present

## 2018-05-19 DIAGNOSIS — J80 Acute respiratory distress syndrome: Secondary | ICD-10-CM | POA: Diagnosis not present

## 2018-05-19 DIAGNOSIS — I10 Essential (primary) hypertension: Secondary | ICD-10-CM | POA: Insufficient documentation

## 2018-05-19 DIAGNOSIS — F419 Anxiety disorder, unspecified: Secondary | ICD-10-CM | POA: Insufficient documentation

## 2018-05-19 DIAGNOSIS — R079 Chest pain, unspecified: Secondary | ICD-10-CM | POA: Diagnosis present

## 2018-05-19 LAB — CBC WITH DIFFERENTIAL/PLATELET
Abs Immature Granulocytes: 0.06 10*3/uL (ref 0.00–0.07)
BASOS PCT: 0 %
Basophils Absolute: 0 10*3/uL (ref 0.0–0.1)
EOS PCT: 1 %
Eosinophils Absolute: 0.1 10*3/uL (ref 0.0–0.5)
HCT: 36.3 % (ref 36.0–46.0)
HEMOGLOBIN: 11.6 g/dL — AB (ref 12.0–15.0)
Immature Granulocytes: 1 %
LYMPHS PCT: 22 %
Lymphs Abs: 1.8 10*3/uL (ref 0.7–4.0)
MCH: 28.7 pg (ref 26.0–34.0)
MCHC: 32 g/dL (ref 30.0–36.0)
MCV: 89.9 fL (ref 80.0–100.0)
MONO ABS: 0.9 10*3/uL (ref 0.1–1.0)
MONOS PCT: 11 %
Neutro Abs: 5.1 10*3/uL (ref 1.7–7.7)
Neutrophils Relative %: 65 %
Platelets: 271 10*3/uL (ref 150–400)
RBC: 4.04 MIL/uL (ref 3.87–5.11)
RDW: 15 % (ref 11.5–15.5)
WBC: 8 10*3/uL (ref 4.0–10.5)
nRBC: 0 % (ref 0.0–0.2)

## 2018-05-19 LAB — HEMOGLOBIN A1C
Hgb A1c MFr Bld: 5.4 % (ref 4.8–5.6)
Mean Plasma Glucose: 108.28 mg/dL

## 2018-05-19 LAB — I-STAT TROPONIN, ED
TROPONIN I, POC: 0 ng/mL (ref 0.00–0.08)
Troponin i, poc: 0 ng/mL (ref 0.00–0.08)

## 2018-05-19 LAB — COMPREHENSIVE METABOLIC PANEL
ALT: 17 U/L (ref 0–44)
ANION GAP: 13 (ref 5–15)
AST: 18 U/L (ref 15–41)
Albumin: 4.4 g/dL (ref 3.5–5.0)
Alkaline Phosphatase: 54 U/L (ref 38–126)
BILIRUBIN TOTAL: 0.6 mg/dL (ref 0.3–1.2)
BUN: 15 mg/dL (ref 6–20)
CALCIUM: 9.6 mg/dL (ref 8.9–10.3)
CO2: 23 mmol/L (ref 22–32)
Chloride: 102 mmol/L (ref 98–111)
Creatinine, Ser: 0.77 mg/dL (ref 0.44–1.00)
GFR calc Af Amer: >60 mL/min (ref >60)
Glucose, Bld: 92 mg/dL (ref 70–99)
POTASSIUM: 4.4 mmol/L (ref 3.5–5.1)
Sodium: 138 mmol/L (ref 135–145)
Total Protein: 7.5 g/dL (ref 6.5–8.1)

## 2018-05-19 LAB — LIPASE, BLOOD: LIPASE: 42 U/L (ref 11–51)

## 2018-05-19 MED ORDER — BUPROPION HCL ER (XL) 150 MG PO TB24
150.0000 mg | ORAL_TABLET | Freq: Every day | ORAL | Status: DC
  Administered 2018-05-20: 150 mg via ORAL
  Filled 2018-05-19 (×2): qty 1

## 2018-05-19 MED ORDER — ENOXAPARIN SODIUM 40 MG/0.4ML ~~LOC~~ SOLN
40.0000 mg | SUBCUTANEOUS | Status: DC
  Administered 2018-05-19: 40 mg via SUBCUTANEOUS
  Filled 2018-05-19: qty 0.4

## 2018-05-19 MED ORDER — AMLODIPINE BESYLATE 5 MG PO TABS
5.0000 mg | ORAL_TABLET | Freq: Every day | ORAL | Status: DC
  Administered 2018-05-19 – 2018-05-20 (×2): 5 mg via ORAL
  Filled 2018-05-19 (×2): qty 2

## 2018-05-19 MED ORDER — AZELASTINE HCL 137 MCG/SPRAY NA SOLN
2.0000 | Freq: Two times a day (BID) | NASAL | Status: DC | PRN
  Filled 2018-05-19: qty 30

## 2018-05-19 MED ORDER — PANTOPRAZOLE SODIUM 40 MG PO TBEC
40.0000 mg | DELAYED_RELEASE_TABLET | Freq: Every day | ORAL | Status: DC
  Administered 2018-05-19 – 2018-05-20 (×2): 40 mg via ORAL
  Filled 2018-05-19 (×2): qty 1

## 2018-05-19 MED ORDER — FLUTICASONE PROPIONATE 50 MCG/ACT NA SUSP
1.0000 | Freq: Every day | NASAL | Status: DC
  Administered 2018-05-19 – 2018-05-20 (×2): 1 via NASAL
  Filled 2018-05-19: qty 16

## 2018-05-19 MED ORDER — SENNOSIDES-DOCUSATE SODIUM 8.6-50 MG PO TABS
2.0000 | ORAL_TABLET | Freq: Once | ORAL | Status: AC
  Administered 2018-05-19: 2 via ORAL
  Filled 2018-05-19: qty 2

## 2018-05-19 MED ORDER — LAMOTRIGINE 150 MG PO TABS
150.0000 mg | ORAL_TABLET | Freq: Every day | ORAL | Status: DC
  Administered 2018-05-20: 150 mg via ORAL
  Filled 2018-05-19 (×2): qty 1

## 2018-05-19 MED ORDER — CLONAZEPAM 0.5 MG PO TABS: 1.0000 mg | ORAL_TABLET | Freq: Four times a day (QID) | ORAL | Status: DC | PRN

## 2018-05-19 MED ORDER — PANTOPRAZOLE SODIUM 40 MG PO TBEC: 40.0000 mg | DELAYED_RELEASE_TABLET | Freq: Every day | ORAL | Status: DC

## 2018-05-19 MED ORDER — AMLODIPINE BESYLATE 5 MG PO TABS: 5.0000 mg | ORAL_TABLET | Freq: Every day | ORAL | Status: DC

## 2018-05-19 MED ORDER — ALBUTEROL SULFATE (2.5 MG/3ML) 0.083% IN NEBU: 3.0000 mL | INHALATION_SOLUTION | Freq: Four times a day (QID) | RESPIRATORY_TRACT | Status: DC | PRN

## 2018-05-19 NOTE — ED Triage Notes (Signed)
To room via EMS.  Onset last night 11pm mid chest pain, dizziness, nausea.  Pain worse today.  Pt took ASA 81 mg X 4 this morning.   EMS gave Zofran 4mg  IV- nausea resolved.   Pt not c/o pain at this time.   Dizziness is improving.

## 2018-05-19 NOTE — ED Provider Notes (Signed)
Medical screening examination/treatment/procedure(s) were conducted as a shared visit with non-physician practitioner(s) and myself.  I personally evaluated the patient during the encounter.  EKG Interpretation  Date/Time:  Sunday May 19 2018 12:34:24 EST Ventricular Rate:  80 PR Interval:    QRS Duration: 90 QT Interval:  413 QTC Calculation: 477 R Axis:   73 Text Interpretation:  Sinus rhythm Biatrial enlargement Probable left ventricular hypertrophy Artifact No significant change since last tracing Confirmed by Fredia Sorrow [phone]) on 05/19/2018 3:35:33 PM   Patient seen by me along with physician assistant.  Patient with a history of intermittent chest pain.  Chest pain is kind of central radiates to the back.  Occurred while walking about an hour ago.  Patient with a past history of MI about 10 years ago.  Patient seen November 20 for this.  Did not want to be admitted at that time and was discharged home.  Patient's troponin was negative then.  Initial troponin here today is negative.  However patient states that in between the 20th and now she has had some days when she is been pain-free.  Just returned again today.  Therefore patient will require admission for chest pain rule out.  EKG without any acute changes.  Labs without any significant abnormality.  Chest x-ray without any acute findings.   Fredia Sorrow, MD 05/19/18 1640

## 2018-05-19 NOTE — H&P (Addendum)
History and Physical  Nichole Gibbs RAQ:[id number] DOB: 1956-04-19 DOA: 05/19/2018  Referring physician: EDP PCP: Minette Brine, FNP   Chief Complaint: Chest pain with dizziness  HPI: Nichole Gibbs is a 62 y.o. female   History of bipolar disorder, acid reflux, sinusitis, history of mitral valve prolapse with moderate mitral regurgitation followed by Dr. Burt Knack Presented to the ED with left-sided chest pain with associated dizziness.  He was in the ED for the same 4 days ago she declined hospitalization at the time.  She returned to the ED with similar symptoms.  Troponin is negative, EKG sinus rhythm with biatrial enlargement and possible LVH.  Patient reports chest pain started at rest, relieved by aspirin.  She does has history of GERD, she does not think her symptom is related to GERD this time.  ED course: Vital signs are stable, she is not orthostatic.  Troponin negative, creatinine within normal limits, hemoglobin 11.6 which is close to baseline.  Hospitalist called  for chest pain rule out. Also rule out arrhythmia due to reported dizziness.  Review of Systems:  Detail per HPI, Review of systems are otherwise negative  Past Medical History:  Diagnosis Date  . Anemia   . Anxiety   . Bipolar 1 disorder ([place])   . Chest pain    denies at this time 10/7  . Depression   . GERD (gastroesophageal reflux disease)   . Hypertension   . Memory loss    pt states mild memory loss  . Mitral valve prolapse    cardiologist-   dr Burt Knack  . Moderate mitral regurgitation   . Osteoarthritis   . Right patella fracture   . Rotator cuff syndrome    right side   Past Surgical History:  Procedure Laterality Date  . ANTERIOR CERVICAL DECOMP/DISCECTOMY FUSION  05-19-2009   C4 --  C7  and C5 corpectomy  . CARDIOVASCULAR STRESS TEST  11-20-2012  dr cooper   normal perfusion study/  no ischemia/  ef 62%  . CAUTERIZATION POST TONSILLECTOMY  09-25-2003  . CESAREAN SECTION    .  PATELLECTOMY Right 04/03/2014   Procedure: PATELLECTOMY;  Surgeon: Sydnee Cabal, MD;  Location: [facility];  Service: Orthopedics;  Laterality: Right;  . RE-EXPLORATION ANTERIOR CERVICAL WOUND AND EVACUATION HEMATOMA  05-19-2009  . TONSILLECTOMY  09-07-2003  . TRANSTHORACIC ECHOCARDIOGRAM  01-07-2014   grade II diastolic dysfunction/  ef 55-60%/  mild late systolic mitral valve prolapse of anterior leaflet/  moderate MR   Social History:  reports that she has been smoking cigarettes. She has a 15.00 pack-year smoking history. She has never used smokeless tobacco. She reports that she drank alcohol. She reports that she does not use drugs. Patient lives at home& is able to participate in activities of daily living independently , she works as a Educational psychologist  Allergies  Allergen Reactions  . Tizanidine     Pt felt paranoid, homicidal, sweating, could not sleep   . Acetaminophen     Upsets stomach   . Ibuprofen Other (See Comments)    Pt states it causes stomach pains d/t hx of ulcers  . Lactose Intolerance (Gi) Other (See Comments)    Gas/Bloating/upset stomach    Family History  Problem Relation Age of Onset  . Heart disease Mother   . Heart attack Father   . Colon polyps Father   . Skin cancer Father   . Esophageal cancer Maternal Uncle   . Other Maternal Aunt  stomcah polyps  . Bone cancer Paternal Grandfather   . Uterine cancer Paternal Grandmother   . Liver cancer Paternal Uncle   . Cirrhosis Paternal Uncle       Prior to Admission medications   Medication Sig Start Date End Date Taking? Authorizing Provider  albuterol (PROVENTIL HFA;VENTOLIN HFA) 108 (90 Base) MCG/ACT inhaler Inhale 2 puffs into the lungs every 6 (six) hours as needed for wheezing or shortness of breath. 04/24/18   Minette Brine, FNP  amLODipine (NORVASC) 5 MG tablet Take 5 mg by mouth daily. 02/15/15   [provider]  Azelastine HCl 137 MCG/SPRAY SOLN Place 2 sprays into the  nose 2 (two) times daily. 04/24/18   Minette Brine, FNP  buPROPion (WELLBUTRIN XL) 150 MG 24 hr tablet TK 1 T PO QAM 04/06/18   [provider]  clonazePAM (KLONOPIN) 1 MG tablet Take 1 mg by mouth 4 (four) times daily as needed for anxiety.  04/17/15   [provider]  fluticasone (FLONASE) 50 MCG/ACT nasal spray SHAKE LIQUID AND USE 1 SPRAY IN EACH NOSTRIL EVERY DAY 04/10/18   Minette Brine, FNP  lamoTRIgine (LAMICTAL) 150 MG tablet lamotrigine 150 mg tablet    [provider]  pantoprazole (PROTONIX) 40 MG tablet Take 1 tablet (40 mg total) by mouth daily. Patient taking differently: Take 80 mg by mouth daily.  01/13/15   Kerrie Buffalo, NP    Physical Exam: BP (!) 132/55 (BP Location: Right Arm)   Pulse 85   Temp 98.1 F (36.7 C) (Oral)   Resp 15   SpO2 98%   General:  NAD Eyes: PERRL ENT: unremarkable Neck: supple, no JVD Cardiovascular: RRR, soft 2/6 precordial murmur Respiratory: CTABL Abdomen: soft/NT/ND, positive bowel sounds Skin: no rash Musculoskeletal:  No edema Psychiatric: calm/cooperative Neurologic: no focal findings            Labs on Admission:  Basic Metabolic Panel: Recent Labs  Lab 05/15/18 1108 05/19/18 1255  NA 137 138  K 4.2 4.4  CL 107 102  CO2 21* 23  GLUCOSE 90 92  BUN 19 15  CREATININE 0.82 0.77  CALCIUM 9.6 9.6   Liver Function Tests: Recent Labs  Lab 05/19/18 1255  AST 18  ALT 17  ALKPHOS 54  BILITOT 0.6  PROT 7.5  ALBUMIN 4.4   Recent Labs  Lab 05/19/18 1255  LIPASE 42   No results for input(s): AMMONIA in the last 168 hours. CBC: Recent Labs  Lab 05/15/18 1108 05/19/18 1652  WBC 9.2 8.0  NEUTROABS  --  5.1  HGB 11.8* 11.6*  HCT 37.9 36.3  MCV 89.6 89.9  PLT 278 271   Cardiac Enzymes: No results for input(s): CKTOTAL, CKMB, CKMBINDEX, TROPONINI in the last 168 hours.  BNP (last 3 results) No results for input(s): BNP in the last 8760 hours.  ProBNP (last 3 results) No results  for input(s): PROBNP in the last 8760 hours.  CBG: No results for input(s): GLUCAP in the last 168 hours.  Radiological Exams on Admission: Dg Chest 2 View  Result Date: 05/19/2018 CLINICAL DATA:  Chest pain and shortness of breath EXAM: CHEST - 2 VIEW COMPARISON:  05/15/2018 FINDINGS: Mild chronic cardiomegaly, less prominent than before. Stable mild fine opacities at the bases attributed scarring or atelectasis. There is no edema, consolidation, effusion, or pneumothorax. IMPRESSION: Stable from prior.  No evidence of acute disease. Electronically Signed   By: Monte Fantasia M.D.   On: 05/19/2018 14:02  Assessment/Plan Present on Admission: . Chest pain  Chest pain rule out -Troponin negative, no acute ST-T changes on EKG -She reports family history of cardiac disease she is also a smoker -will check fasting lipid panel, he does has history of mitral valve prolapse we will repeat echocardiogram -Case discussed with cardiology who will evaluate patient in consult  Addendum: case discussed with cardiology who recommend coronary CTA which is ordered  Dizziness -She is not orthostatic -We will keep on telemetry to rule out arrhythmia -Also reports recent history of sinus issues, she has an follow-up appointment with ENT in a few weeks  GERD Continue PPI  HTN: stable , continue norvasc  H/o bipolar depression/ anxiety -Stable, pleasant -Continue home meds including Wellbutrin, Klonopin, Lamictal  DVT prophylaxis: lovenox  Consultants: case discussed with cardiology oncall  Dr Kirby Crigler who will see patient in consult  Code Status: full   Family Communication:  Patient   Disposition Plan: med tele obs  Time spent: 33mins  Florencia Reasons MD, PhD [facility] Pager [phone] If 7PM-7AM, please contact night-coverage at [email or web address], password [facility]

## 2018-05-19 NOTE — ED Provider Notes (Signed)
[facility] EMERGENCY DEPARTMENT Provider Note   CSN: [id number] Arrival date & time: 05/19/18  1225     History   Chief Complaint No chief complaint on file.   HPI Nichole Gibbs is a 62 y.o. female with past medical history of GERD, mitral valve prolapse, occasionally followed by Dr. Burt Knack cardiology, bipolar 1, anxiety, hypertension, who presents today for evaluation of chest pain.  She was seen in the emergency room 4 days ago for similar and admission for further evaluation was recommended, however patient refused stating that she had to go home to see her dog.  Patient reports that since then she has had intermittent pain in the left side of her chest/stomach.  She says that her pain started last night at around 11 however went away overnight.  She says that this morning she has had chest pain.  She tried to get up to go to work however got very dizzy nauseous and felt like she was going to pass out.  She did vomit once.  No diarrhea.  She denies headache.  Says that her pain radiates from her left upper abdomen up into her chest, she occasionally feels short of breath, and radiates up both sides of her neck, left worse than right.  She took 4 ASA 81mg  PTA today.   HPI  Past Medical History:  Diagnosis Date  . Anemia   . Anxiety   . Bipolar 1 disorder ([place])   . Chest pain    denies at this time 10/7  . Depression   . GERD (gastroesophageal reflux disease)   . Hypertension   . Memory loss    pt states mild memory loss  . Mitral valve prolapse    cardiologist-   dr Burt Knack  . Moderate mitral regurgitation   . Osteoarthritis   . Right patella fracture   . Rotator cuff syndrome    right side    Patient Active Problem List   Diagnosis Date Noted  . Paranoid ([place])   . Major depressive disorder, recurrent, severe without psychotic features ([place])   . Severe recurrent major depressive disorder with psychotic features ([place]) 01/07/2015  . Chronic pain  syndrome 01/07/2015  . S/P knee surgery 04/03/2014  . Unspecified constipation 10/14/2013  . Halitosis 10/14/2013  . MITRAL REGURGITATION 04/15/2009  . Essential hypertension 04/15/2009  . MITRAL VALVE PROLAPSE, HX OF 04/15/2009  . Bipolar I disorder ([place]) 10/27/2006  . Anxiety state 10/27/2006  . TOBACCO ABUSE 10/27/2006  . GERD 10/27/2006  . OSTEOARTHRITIS, HANDS, BILATERAL 10/27/2006  . SYNDROME, ROTATOR CUFF NOS 10/27/2006    Past Surgical History:  Procedure Laterality Date  . ANTERIOR CERVICAL DECOMP/DISCECTOMY FUSION  05-19-2009   C4 --  C7  and C5 corpectomy  . CARDIOVASCULAR STRESS TEST  11-20-2012  dr cooper   normal perfusion study/  no ischemia/  ef 62%  . CAUTERIZATION POST TONSILLECTOMY  09-25-2003  . CESAREAN SECTION    . PATELLECTOMY Right 04/03/2014   Procedure: PATELLECTOMY;  Surgeon: Sydnee Cabal, MD;  Location: [facility];  Service: Orthopedics;  Laterality: Right;  . RE-EXPLORATION ANTERIOR CERVICAL WOUND AND EVACUATION HEMATOMA  05-19-2009  . TONSILLECTOMY  09-07-2003  . TRANSTHORACIC ECHOCARDIOGRAM  01-07-2014   grade II diastolic dysfunction/  ef 55-60%/  mild late systolic mitral valve prolapse of anterior leaflet/  moderate MR     OB History   None      Home Medications    Prior to Admission medications  Medication Sig Start Date End Date Taking? Authorizing Provider  albuterol (PROVENTIL HFA;VENTOLIN HFA) 108 (90 Base) MCG/ACT inhaler Inhale 2 puffs into the lungs every 6 (six) hours as needed for wheezing or shortness of breath. 04/24/18   Minette Brine, FNP  amLODipine (NORVASC) 5 MG tablet Take 5 mg by mouth daily. 02/15/15   [provider]  Azelastine HCl 137 MCG/SPRAY SOLN Place 2 sprays into the nose 2 (two) times daily. 04/24/18   Minette Brine, FNP  buPROPion (WELLBUTRIN XL) 150 MG 24 hr tablet TK 1 T PO QAM 04/06/18   [provider]  clonazePAM (KLONOPIN) 1 MG tablet Take 1 mg by mouth 4 (four) times  daily as needed for anxiety.  04/17/15   [provider]  fluticasone (FLONASE) 50 MCG/ACT nasal spray SHAKE LIQUID AND USE 1 SPRAY IN EACH NOSTRIL EVERY DAY 04/10/18   Minette Brine, FNP  lamoTRIgine (LAMICTAL) 150 MG tablet lamotrigine 150 mg tablet    [provider]  pantoprazole (PROTONIX) 40 MG tablet Take 1 tablet (40 mg total) by mouth daily. Patient taking differently: Take 80 mg by mouth daily.  01/13/15   Kerrie Buffalo, NP    Family History Family History  Problem Relation Age of Onset  . Heart disease Mother   . Heart attack Father   . Colon polyps Father   . Skin cancer Father   . Esophageal cancer Maternal Uncle   . Other Maternal Aunt        stomcah polyps  . Bone cancer Paternal Grandfather   . Uterine cancer Paternal Grandmother   . Liver cancer Paternal Uncle   . Cirrhosis Paternal Uncle     Social History Social History   Tobacco Use  . Smoking status: Current Every Day Smoker    Packs/day: 0.50    Years: 30.00    Pack years: 15.00    Types: Cigarettes  . Smokeless tobacco: Never Used  Substance Use Topics  . Alcohol use: Not Currently  . Drug use: No     Allergies   Tizanidine; Acetaminophen; Ibuprofen; and Lactose intolerance (gi)   Review of Systems Review of Systems  Constitutional: Negative for chills and fever.  HENT: Negative for congestion.   Eyes: Negative for visual disturbance.  Respiratory: Positive for shortness of breath. Negative for cough.   Cardiovascular: Positive for chest pain and palpitations. Negative for leg swelling.  Gastrointestinal: Positive for abdominal pain, nausea and vomiting. Negative for constipation and diarrhea.  Genitourinary: Negative for dysuria.  Musculoskeletal: Positive for neck pain. Negative for neck stiffness.  Neurological: Positive for light-headedness. Negative for dizziness and headaches.  All other systems reviewed and are negative.    Physical Exam Updated Vital  Signs Pulse 82   Temp 97.6 F (36.4 C) (Oral)   Resp 10   SpO2 100%   Physical Exam  Constitutional: She is oriented to person, place, and time. She appears well-developed and well-nourished. No distress.  HENT:  Head: Normocephalic and atraumatic.  Mouth/Throat: Oropharynx is clear and moist.  Eyes: Conjunctivae are normal.  Neck: Normal range of motion. Neck supple. No JVD present. No tracheal deviation present.  Cardiovascular: Normal rate, regular rhythm and intact distal pulses.  Murmur heard. No edema to BLE  Pulmonary/Chest: Effort normal and breath sounds normal. No respiratory distress. She has no wheezes.  Abdominal: Soft. Bowel sounds are normal. She exhibits no distension. There is no tenderness. There is no guarding.  Musculoskeletal: She exhibits no edema.  Left sided  trapezius TTP, patient pulls away when attempting to palpate.   Neurological: She is alert and oriented to person, place, and time.  Skin: Skin is warm and dry.  Psychiatric: She has a normal mood and affect. Her behavior is normal.  Nursing note and vitals reviewed.    ED Treatments / Results  Labs (all labs ordered are listed, but only abnormal results are displayed) Labs Reviewed  CBC WITH DIFFERENTIAL/PLATELET - Abnormal; Notable for the following components:      Result Value   Hemoglobin 11.6 (*)    All other components within normal limits  COMPREHENSIVE METABOLIC PANEL  LIPASE, BLOOD  HEMOGLOBIN A1C  CBC WITH DIFFERENTIAL/PLATELET  LIPID PANEL  HIV ANTIBODY (ROUTINE TESTING W REFLEX)  CBC  COMPREHENSIVE METABOLIC PANEL  I-STAT TROPONIN, ED  I-STAT TROPONIN, ED    EKG EKG Interpretation  Date/Time:  Sunday May 19 2018 12:34:24 EST Ventricular Rate:  80 PR Interval:    QRS Duration: 90 QT Interval:  413 QTC Calculation: 477 R Axis:   73 Text Interpretation:  Sinus rhythm Biatrial enlargement Probable left ventricular hypertrophy Artifact No significant change since last  tracing Confirmed by Fredia Sorrow [id number]) on 05/19/2018 3:35:33 PM   Radiology Dg Chest 2 View  Result Date: 05/19/2018 CLINICAL DATA:  Chest pain and shortness of breath EXAM: CHEST - 2 VIEW COMPARISON:  05/15/2018 FINDINGS: Mild chronic cardiomegaly, less prominent than before. Stable mild fine opacities at the bases attributed scarring or atelectasis. There is no edema, consolidation, effusion, or pneumothorax. IMPRESSION: Stable from prior.  No evidence of acute disease. Electronically Signed   By: Monte Fantasia M.D.   On: 05/19/2018 14:02    Procedures Procedures (including critical care time)  Medications Ordered in ED Medications - No data to display   Initial Impression / Assessment and Plan / ED Course  I have reviewed the triage vital signs and the nursing notes.  Pertinent labs & imaging results that were available during my care of the patient were reviewed by me and considered in my medical decision making (see chart for details).  Clinical Course as of May 20 2151  Nancy Fetter May 19, 2018  1531 Spoke with lab, CBC was clotted.     [EH]  1734 Spoke with hospitalist who agreed to admit patient.     [EH]    Clinical Course User Index [EH] Lorin Glass, PA-C    Patient presents today for evaluation of chest pain, shortness of breath with associated lightheadedness.  She has been having intermittent chest pain on and off over the past few days, however today she became lightheaded to the point that she was unable to stand and unable to go to work in the presence of chest pain.  Initial troponin is normal.  EKG does not show evidence of acute ischemia.  Given the patient reports palpitations during all of this concern for an arrhythmia causing her chest pain and lightheadedness.  Labs are obtained, patient is not significantly anemic, does not have any significant electrolyte abnormalities.  I spoke with Dr. Erlinda Hong who agreed to admit patient to hospitalist service for  further evaluation.  This patient was seen as a shared visit with Dr. Rogene Houston.  Final Clinical Impressions(s) / ED Diagnoses   Final diagnoses:  Chest pain, unspecified type  Dizziness    ED Discharge Orders    None       Ollen Gross 05/19/18 2155    Fredia Sorrow, MD 05/20/18 [phone]

## 2018-05-19 NOTE — Consult Note (Signed)
CARDIOLOGY CONSULT NOTE   Referring Physician: Dr. Erlinda Hong Primary Physician: Dr. Laurance Flatten Primary Cardiologist: Dr. Burt Knack Reason for Consultation: chest pain   HPI: Nichole Gibbs is a 62 y.o. female w/ history of smoking, HTN, strong family history of CAD, anxiety, GERD, and atypical chest pain who presents with chest pain.  In brief, the patient has a long-standing history of chest pain dating back at least approximately 10 years.  She has been followed by Dr. Sherren Mocha in the outpatient setting and has previously undergone 2 nuclear stress tests for atypical sounding chest discomfort.  Her last stress test was in 2014.  This was an exercise nuclear stress and was negative for any evidence of ischemia.  Currently, the patient states that since her stress test in 2014 she is intermittently had migratory chest, back, and abdominal pains similar to those that she has had in the past.  On the morning of her current presentation, she developed acute onset left mid axillary pain at the lowest rib that radiated to the lower back.  This pain, similar to her prior pains, then moved to her epigastrium, to her neck, and eventually to her mid sternal chest.  She states that this is the worst pain she has ever felt.  She was just sitting at rest when she felt the onset of her symptoms.  She took some aspirin and her pain improved.  Her pain is waxing and waning and will typically last for several minutes to several hours in duration before typically spontaneously resolving without intervention.  Because her pain was so severe this morning, she called EMS and was brought to the emergency department for evaluation.  In the emergency department, the patient was found to have normal vital signs.  An ECG was obtained and revealed probable LVH but otherwise normal sinus rhythm and minimal submillimeter ST depressions that appear consistent with repolarization of normality in the setting of LVH.  Her labs were  all normal including 2 negative troponins.  The patient was admitted to the hospitalist service and cardiology was consulted to weigh in on further work-up for the patient's chest discomfort.  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  She states that she never has symptoms of chest discomfort while she is exerting herself.  Her symptoms are almost always while she is at rest.  She states that she has been under a great deal of life stress lately and is having a lot of anxiety as well.  Review of Systems:     Cardiac Review of Systems: {Y] = yes [ ]  = no  Chest Pain [Y]  Resting SOB [   ] Exertional SOB  [  ]  Orthopnea [  ]   Pedal Edema [   ]    Palpitations [  ] Syncope  [  ]   Presyncope [   ]  General Review of Systems: [Y] = yes [  ]=no Constitional: recent weight change [  ]; anorexia [  ]; fatigue [  ]; nausea [  ]; night sweats [  ]; fever [  ]; or chills [  ];  Eyes : blurred vision [  ]; diplopia [   ]; vision changes [  ];  Amaurosis fugax[  ]; Resp: cough [  ];  wheezing[  ];  hemoptysis[  ];  PND [  ];  GI:  gallstones[  ], vomiting[  ];  dysphagia[  ]; melena[  ];  hematochezia [  ]; heartburn[  ];   GU: kidney stones [  ]; hematuria[  ];   dysuria [  ];  nocturia[  ]; incontinence [  ];             Skin: rash, swelling[  ];, hair loss[  ];  peripheral edema[  ];  or itching[  ]; Musculosketetal: myalgias[  ];  joint swelling[  ];  joint erythema[  ];  joint pain[  ];  back pain[  ];  Heme/Lymph: bruising[  ];  bleeding[  ];  anemia[  ];  Neuro: TIA[  ];  headaches[  ];  stroke[  ];  vertigo[  ];  seizures[  ];   paresthesias[  ];  difficulty walking[  ];  Psych:depression[  ]; anxiety[Y];  Endocrine: diabetes[  ];  thyroid dysfunction[  ];  Other:  Past Medical History:  Diagnosis Date  . Anemia   . Anxiety   . Bipolar 1 disorder ([place])   .  Chest pain    denies at this time 10/7  . Depression   . GERD (gastroesophageal reflux disease)   . Hypertension   . Memory loss    pt states mild memory loss  . Mitral valve prolapse    cardiologist-   dr Burt Knack  . Moderate mitral regurgitation   . Osteoarthritis   . Right patella fracture   . Rotator cuff syndrome    right side     (Not in a hospital admission)     Infusions:   Allergies  Allergen Reactions  . Tizanidine     Pt felt paranoid, homicidal, sweating, could not sleep   . Acetaminophen     Upsets stomach   . Ibuprofen Other (See Comments)    Pt states it causes stomach pains d/t hx of ulcers  . Lactose Intolerance (Gi) Other (See Comments)    Gas/Bloating/upset stomach    Social History   Socioeconomic History  . Marital status: Divorced    Spouse name: Not on file  . Number of children: 3  . Years of education: Not on file  . Highest education level: Not on file  Occupational History  . Occupation: DISABILITY    Employer: UNEMPLOYED  Social Needs  . Financial resource strain: Not hard at all  . Food insecurity:    Worry: Never true    Inability: Never true  . Transportation needs:    Medical: No    Non-medical: No  Tobacco Use  . Smoking status: Current Every Day Smoker    Packs/day: 0.50    Years: 30.00    Pack years: 15.00    Types: Cigarettes  . Smokeless tobacco: Never Used  Substance and Sexual Activity  . Alcohol use: Not Currently  . Drug use: No  . Sexual activity: Not Currently  Lifestyle  . Physical activity:    Days per week: 0 days    Minutes per session: 0 min  . Stress: Very much  Relationships  . Social connections:    Talks on phone: Not on file    Gets together: Not on file    Attends religious service: Not on  file    Active member of club or organization: Not on file    Attends meetings of clubs or organizations: Not on file    Relationship status: Not on file  . Intimate partner violence:    Fear of  current or ex partner: No    Emotionally abused: No    Physically abused: No    Forced sexual activity: No  Other Topics Concern  . Not on file  Social History Narrative  . Not on file    Family History  Problem Relation Age of Onset  . Heart disease Mother   . Heart attack Father   . Colon polyps Father   . Skin cancer Father   . Esophageal cancer Maternal Uncle   . Other Maternal Aunt        stomcah polyps  . Bone cancer Paternal Grandfather   . Uterine cancer Paternal Grandmother   . Liver cancer Paternal Uncle   . Cirrhosis Paternal Uncle     PHYSICAL EXAM: Vitals:   05/19/18 1454 05/19/18 1654  BP:  (!) 132/55  Pulse:  85  Resp:  15  Temp: 98.1 F (36.7 C)   SpO2:  98%    No intake or output data in the 24 hours ending 05/19/18 1804  General:  Well appearing. No respiratory difficulty HEENT: normal Neck: supple. no JVD. Carotids 2+ bilat; no bruits. No lymphadenopathy or thryomegaly appreciated. Cor: PMI nondisplaced. Regular rate & rhythm.  3/6 holosystolic murmur heard throughout the precordium but best heard at the apex. Lungs: clear Abdomen: soft, nontender, nondistended. No hepatosplenomegaly. No bruits or masses. Good bowel sounds. Extremities: no cyanosis, clubbing, rash, edema Neuro: alert & oriented x 3, cranial nerves grossly intact. moves all 4 extremities w/o difficulty. Affect pleasant.  ECG: Normal sinus rhythm, probable LVH, minimal submillimeter ST depressions suggestive of strain in the setting of LVH, no acute ST or T wave abnormality suggestive of acute ischemia  Results for orders placed or performed during the hospital encounter of 05/19/18 (from the past 24 hour(s))  Comprehensive metabolic panel     Status: None   Collection Time: 05/19/18 12:55 PM  Result Value Ref Range   Sodium 138 135 - 145 mmol/L   Potassium 4.4 3.5 - 5.1 mmol/L   Chloride 102 98 - 111 mmol/L   CO2 23 22 - 32 mmol/L   Glucose, Bld 92 70 - 99 mg/dL   BUN 15 6 -  20 mg/dL   Creatinine, Ser 0.77 0.44 - 1.00 mg/dL   Calcium 9.6 8.9 - 10.3 mg/dL   Total Protein 7.5 6.5 - 8.1 g/dL   Albumin 4.4 3.5 - 5.0 g/dL   AST 18 15 - 41 U/L   ALT 17 0 - 44 U/L   Alkaline Phosphatase 54 38 - 126 U/L   Total Bilirubin 0.6 0.3 - 1.2 mg/dL   GFR calc non Af Amer >60 >60 mL/min   GFR calc Af Amer >60 >60 mL/min   Anion gap 13 5 - 15  Lipase, blood     Status: None   Collection Time: 05/19/18 12:55 PM  Result Value Ref Range   Lipase 42 11 - 51 U/L  I-stat troponin, ED (0, 3)     Status: None   Collection Time: 05/19/18  1:12 PM  Result Value Ref Range   Troponin i, poc 0.00 0.00 - 0.08 ng/mL   Comment 3          CBC with Differential  Status: Abnormal   Collection Time: 05/19/18  4:52 PM  Result Value Ref Range   WBC 8.0 4.0 - 10.5 K/uL   RBC 4.04 3.87 - 5.11 MIL/uL   Hemoglobin 11.6 (L) 12.0 - 15.0 g/dL   HCT 36.3 36.0 - 46.0 %   MCV 89.9 80.0 - 100.0 fL   MCH 28.7 26.0 - 34.0 pg   MCHC 32.0 30.0 - 36.0 g/dL   RDW 15.0 11.5 - 15.5 %   Platelets 271 150 - 400 K/uL   nRBC 0.0 0.0 - 0.2 %   Neutrophils Relative % 65 %   Neutro Abs 5.1 1.7 - 7.7 K/uL   Lymphocytes Relative 22 %   Lymphs Abs 1.8 0.7 - 4.0 K/uL   Monocytes Relative 11 %   Monocytes Absolute 0.9 0.1 - 1.0 K/uL   Eosinophils Relative 1 %   Eosinophils Absolute 0.1 0.0 - 0.5 K/uL   Basophils Relative 0 %   Basophils Absolute 0.0 0.0 - 0.1 K/uL   Immature Granulocytes 1 %   Abs Immature Granulocytes 0.06 0.00 - 0.07 K/uL  I-stat troponin, ED (0, 3)     Status: None   Collection Time: 05/19/18  4:56 PM  Result Value Ref Range   Troponin i, poc 0.00 0.00 - 0.08 ng/mL   Comment 3           Dg Chest 2 View  Result Date: 05/19/2018 CLINICAL DATA:  Chest pain and shortness of breath EXAM: CHEST - 2 VIEW COMPARISON:  05/15/2018 FINDINGS: Mild chronic cardiomegaly, less prominent than before. Stable mild fine opacities at the bases attributed scarring or atelectasis. There is no edema,  consolidation, effusion, or pneumothorax. IMPRESSION: Stable from prior.  No evidence of acute disease. Electronically Signed   By: Monte Fantasia M.D.   On: 05/19/2018 14:02    ASSESSMENT: In summary, HEDWIG MCFALL is a 62 y.o. female w/ history of smoking, HTN, strong family history of CAD, anxiety, GERD, and atypical chest pain who presents with chest pain.  Based on the patient's description of her symptoms as well as her history of similar symptoms, it seems unlikely that her chest discomfort is cardiac in nature.  She has had 2- troponins and thus has ruled out for acute coronary syndrome.  She does however have a very strong family history with multiple first-degree family members experiencing cardiovascular events.  She also is an active every day smoker.  Given these factors, it is reasonable to proceed with some form of stress testing.  The patient has had 2 nuclear stress test in the past, both of which she is tolerated reasonably well and I provided interpretable results.  Perhaps a better test for this patient would be a coronary CT angiogram.  If her coronary CT angiogram were to be negative for obstructive disease, this would be very reassuring that her current symptoms were noncardiac in etiology and would also be very reassuring that future similar presentations within the next several years may also not require invasive testing.  If for whatever reason the patient does not tolerate a coronary CT angiogram or one cannot be performed for logistic reasons, it would be reasonable to proceed with exercise SPECT.  PLAN/DISCUSSION: - Recommend coronary CT angiogram in the a.m. as per above - Obtain lipid panel and hemoglobin A1c - Smoking cessation counseling - Obtain echocardiogram to monitor the patient's previously diagnosed moderate mitral regurgitation - Consider starting the patient on statin therapy given her elevated risk for  cardiovascular events  Marcie Mowers,  MD Cardiology Fellow, PGY-6

## 2018-05-19 NOTE — ED Notes (Signed)
Patient transported to X-ray 

## 2018-05-20 ENCOUNTER — Encounter (HOSPITAL_COMMUNITY): Payer: Self-pay | Admitting: General Practice

## 2018-05-20 ENCOUNTER — Other Ambulatory Visit: Payer: Self-pay

## 2018-05-20 ENCOUNTER — Observation Stay (HOSPITAL_BASED_OUTPATIENT_CLINIC_OR_DEPARTMENT_OTHER): Payer: Medicare Other

## 2018-05-20 ENCOUNTER — Observation Stay (HOSPITAL_COMMUNITY): Payer: Medicare Other

## 2018-05-20 DIAGNOSIS — R0789 Other chest pain: Secondary | ICD-10-CM | POA: Diagnosis not present

## 2018-05-20 DIAGNOSIS — F439 Reaction to severe stress, unspecified: Secondary | ICD-10-CM | POA: Diagnosis not present

## 2018-05-20 DIAGNOSIS — K219 Gastro-esophageal reflux disease without esophagitis: Secondary | ICD-10-CM | POA: Diagnosis not present

## 2018-05-20 DIAGNOSIS — R079 Chest pain, unspecified: Secondary | ICD-10-CM

## 2018-05-20 DIAGNOSIS — I340 Nonrheumatic mitral (valve) insufficiency: Secondary | ICD-10-CM | POA: Diagnosis not present

## 2018-05-20 DIAGNOSIS — Z8249 Family history of ischemic heart disease and other diseases of the circulatory system: Secondary | ICD-10-CM

## 2018-05-20 LAB — HIV ANTIBODY (ROUTINE TESTING W REFLEX): HIV Screen 4th Generation wRfx: NONREACTIVE

## 2018-05-20 LAB — ECHOCARDIOGRAM COMPLETE
Height: 62 in
WEIGHTICAEL: 2113.6 [oz_av]

## 2018-05-20 LAB — CBC
HEMATOCRIT: 36.3 % (ref 36.0–46.0)
HEMOGLOBIN: 11.5 g/dL — AB (ref 12.0–15.0)
MCH: 28.3 pg (ref 26.0–34.0)
MCHC: 31.7 g/dL (ref 30.0–36.0)
MCV: 89.2 fL (ref 80.0–100.0)
Platelets: 249 10*3/uL (ref 150–400)
RBC: 4.07 MIL/uL (ref 3.87–5.11)
RDW: 14.8 % (ref 11.5–15.5)
WBC: 7.1 10*3/uL (ref 4.0–10.5)
nRBC: 0 % (ref 0.0–0.2)

## 2018-05-20 LAB — COMPREHENSIVE METABOLIC PANEL
ALK PHOS: 52 U/L (ref 38–126)
ALT: 15 U/L (ref 0–44)
AST: 16 U/L (ref 15–41)
Albumin: 4 g/dL (ref 3.5–5.0)
Anion gap: 8 (ref 5–15)
BUN: 13 mg/dL (ref 6–20)
CALCIUM: 9.4 mg/dL (ref 8.9–10.3)
CO2: 25 mmol/L (ref 22–32)
CREATININE: 0.84 mg/dL (ref 0.44–1.00)
Chloride: 106 mmol/L (ref 98–111)
Glucose, Bld: 96 mg/dL (ref 70–99)
Potassium: 4.4 mmol/L (ref 3.5–5.1)
Sodium: 139 mmol/L (ref 135–145)
TOTAL PROTEIN: 6.8 g/dL (ref 6.5–8.1)
Total Bilirubin: 0.6 mg/dL (ref 0.3–1.2)

## 2018-05-20 LAB — LIPID PANEL
CHOL/HDL RATIO: 2 ratio
Cholesterol: 246 mg/dL — ABNORMAL HIGH (ref 0–200)
HDL: 122 mg/dL (ref >40)
LDL CALC: 107 mg/dL — AB (ref 0–99)
Triglycerides: 83 mg/dL (ref <150)
VLDL: 17 mg/dL (ref 0–40)

## 2018-05-20 MED ORDER — MAGNESIUM OXIDE 400 (241.3 MG) MG PO TABS: 400.0000 mg | ORAL_TABLET | Freq: Every day | ORAL | Status: DC

## 2018-05-20 MED ORDER — NITROGLYCERIN 0.4 MG SL SUBL
SUBLINGUAL_TABLET | SUBLINGUAL | Status: AC
  Filled 2018-05-20: qty 2

## 2018-05-20 MED ORDER — PANTOPRAZOLE SODIUM 40 MG PO TBEC: 80.0000 mg | DELAYED_RELEASE_TABLET | Freq: Every day | ORAL | Status: DC

## 2018-05-20 MED ORDER — IOPAMIDOL (ISOVUE-370) INJECTION 76%
80.0000 mL | Freq: Once | INTRAVENOUS | Status: AC | PRN
  Administered 2018-05-20: 80 mL via INTRAVENOUS

## 2018-05-20 MED ORDER — MAGNESIUM OXIDE 400 (241.3 MG) MG PO TABS
400.0000 mg | ORAL_TABLET | Freq: Every day | ORAL | Status: DC
  Administered 2018-05-20: 400 mg via ORAL
  Filled 2018-05-20: qty 1

## 2018-05-20 MED ORDER — NITROGLYCERIN 0.4 MG SL SUBL
0.8000 mg | SUBLINGUAL_TABLET | Freq: Once | SUBLINGUAL | Status: AC
  Administered 2018-05-20: 0.8 mg via SUBLINGUAL

## 2018-05-20 MED ORDER — LAMOTRIGINE 150 MG PO TABS: ORAL_TABLET | ORAL | Status: AC

## 2018-05-20 NOTE — Progress Notes (Addendum)
Progress Note  Patient Name: Nichole Gibbs Date of Encounter: 05/20/2018  Primary Cardiologist: Sherren Mocha, MD   Subjective   No further chest pain, no SOB.  She has been under a great deal of stress .  Inpatient Medications    Scheduled Meds: . amLODipine  5 mg Oral Daily  . buPROPion  150 mg Oral Daily  . enoxaparin (LOVENOX) injection  40 mg Subcutaneous Q24H  . fluticasone  1 spray Each Nare Daily  . lamoTRIgine  150 mg Oral Daily  . pantoprazole  40 mg Oral Daily   Continuous Infusions:  PRN Meds: albuterol, Azelastine HCl, clonazePAM   Vital Signs    Vitals:   05/19/18 2002 05/20/18 0040 05/20/18 0540 05/20/18 0759  BP: (!) 144/73 (!) 130/54 (!) 143/70   Pulse: 81 65 62   Resp: 16 16 18    Temp: 98.3 F (36.8 C) 97.8 F (36.6 C) 98.3 F (36.8 C)   TempSrc: Oral Oral Oral   SpO2: 99% 98% 98%   Weight:    59.9 kg  Height:        Intake/Output Summary (Last 24 hours) at 05/20/2018 0955 Last data filed at 05/20/2018 0600 Gross per 24 hour  Intake 720 ml  Output 0 ml  Net 720 ml   Filed Weights   05/19/18 1848 05/20/18 0759  Weight: 60.5 kg 59.9 kg    Telemetry    SR - Personally Reviewed  ECG    No new - Personally Reviewed  Physical Exam   GEN: No acute distress.   Neck: No JVD Cardiac: RRR, no murmurs, ? Click of valve, no rubs, or gallops.  Respiratory: Clear to auscultation bilaterally. GI: Soft, nontender, non-distended  MS: No edema; No deformity. 2+ pedal pulses Neuro:  Nonfocal  Psych: Normal affect   Labs    Chemistry Recent Labs  Lab 05/15/18 1108 05/19/18 1255 05/20/18 0452  NA 137 138 139  K 4.2 4.4 4.4  CL 107 102 106  CO2 21* 23 25  GLUCOSE 90 92 96  BUN 19 15 13   CREATININE 0.82 0.77 0.84  CALCIUM 9.6 9.6 9.4  PROT  --  7.5 6.8  ALBUMIN  --  4.4 4.0  AST  --  18 16  ALT  --  17 15  ALKPHOS  --  54 52  BILITOT  --  0.6 0.6  GFRNONAA >60 >60 >60  GFRAA >60 >60 >60  ANIONGAP 9 13 8       Hematology Recent Labs  Lab 05/15/18 1108 05/19/18 1652 05/20/18 0452  WBC 9.2 8.0 7.1  RBC 4.23 4.04 4.07  HGB 11.8* 11.6* 11.5*  HCT 37.9 36.3 36.3  MCV 89.6 89.9 89.2  MCH 27.9 28.7 28.3  MCHC 31.1 32.0 31.7  RDW 14.7 15.0 14.8  PLT 278 271 249    Cardiac EnzymesNo results for input(s): TROPONINI in the last 168 hours.  Recent Labs  Lab 05/15/18 1141 05/15/18 1422 05/19/18 1312 05/19/18 1656  TROPIPOC 0.00 0.00 0.00 0.00     BNPNo results for input(s): BNP, PROBNP in the last 168 hours.   DDimer No results for input(s): DDIMER in the last 168 hours.   Radiology    Dg Chest 2 View  Result Date: 05/19/2018 CLINICAL DATA:  Chest pain and shortness of breath EXAM: CHEST - 2 VIEW COMPARISON:  05/15/2018 FINDINGS: Mild chronic cardiomegaly, less prominent than before. Stable mild fine opacities at the bases attributed scarring or atelectasis. There is no  edema, consolidation, effusion, or pneumothorax. IMPRESSION: Stable from prior.  No evidence of acute disease. Electronically Signed   By: Monte Fantasia M.D.   On: 05/19/2018 14:02    Cardiac Studies   Echo 05/20/18 Study Conclusions  - Left ventricle: The cavity size was normal. Wall thickness was   normal. Systolic function was normal. The estimated ejection   fraction was in the range of 60% to 65%. Wall motion was normal;   there were no regional wall motion abnormalities. Doppler   parameters are consistent with high ventricular filling pressure. - Aortic valve: Transvalvular velocity was within the normal range.   There was no stenosis. There was no regurgitation. Mean gradient   (S): 4 mm Hg. - Mitral valve: Moderately thickened leaflets . Mild anterior   leaflet override, with posteriorly directed mitral regurgitation.   There was mild regurgitation. - Left atrium: The atrium was moderately dilated. - Right ventricle: The cavity size was normal. Wall thickness was   normal. Systolic function was  normal. RV systolic pressure (S,   est): 25 mm Hg. - Tricuspid valve: There was trivial regurgitation. - Pulmonary arteries: The main pulmonary artery was normal-sized.   Systolic pressure was within the normal range. - Inferior vena cava: The vessel was normal in size. The   respirophasic diameter changes were in the normal range (>= 50%),   consistent with normal central venous pressure.  Impressions:  - Compared to the previous exam 09/03/2015, no significant change   has occured. Mitral regurgitation was not quantitated today, but   by qualitative color flow, does not appear to have worsened. Side   by side comparison of images performed.   Patient Profile     62 y.o. female  w/ history of smoking, HTN, strong family history of CAD, anxiety, GERD, MVP and mitral regurgitation and atypical chest pain who presented with chest pain.  Assessment & Plan    Chest pain with neg troponins.  Hx of chronic chest pain with prior nuc studies neg. .  This is the most intense pain.   --echo is stable.  --cardiac CTA is ordered to eval for CAD and this will be reassuring if neg. -- was not orthostatic on admit.  --LDL 107  --no further pain.   HTN on amlodipine BP today 130/54 to 143/70        For questions or updates, please contact [place] Please consult [email or web address] for contact info under        Signed, Cecilie Kicks, NP  05/20/2018, 9:55 AM    Personally seen and examined. Agree with above.  62 year old with history of smoking strong family history of CAD anxiety mitral valve regurgitation with atypical chest pain. Chest pain  Coronary CT scan was negative.  No coronary artery disease, no coronary artery calcification.  Reassuring.  Echocardiogram reassuring with normal EF.  Overall explained to her that there are many etiologies behind chest discomfort but thankfully this is noncardiac.  No need for cardiology follow-up. Will sign off. OK with DC.    Candee Furbish, MD

## 2018-05-20 NOTE — Discharge Summary (Signed)
Physician Discharge Summary  Nichole Gibbs ZOX:[id number] DOB: Aug 23, 1955 DOA: 05/19/2018  PCP: Minette Brine, FNP  Admit date: 05/19/2018 Discharge date: 05/20/2018  Admitted From: home Discharge disposition: home   Recommendations for Outpatient Follow-Up:   1. Stress management   Discharge Diagnosis:   Active Problems:   Chest pain    Discharge Condition: Improved.  Diet recommendation: Low sodium, heart healthy  Wound care: None.  Code status: Full.   History of Present Illness:   Nichole Gibbs is a 62 y.o. female   History of bipolar disorder, acid reflux, sinusitis, history of mitral valve prolapse with moderate mitral regurgitation followed by Dr. Burt Knack Presented to the ED with left-sided chest pain with associated dizziness.  He was in the ED for the same 4 days ago she declined hospitalization at the time.  She returned to the ED with similar symptoms.  Troponin is negative, EKG sinus rhythm with biatrial enlargement and possible LVH.  Patient reports chest pain started at rest, relieved by aspirin.  She does has history of GERD, she does not think her symptom is related to GERD this time.   Hospital Course by Problem:   Chest pain -Coronary CT scan was negative.  No coronary artery disease, no coronary artery calcification. Echocardiogram reassuring with normal EF. -cardiology consult appreciated  GERD Protonix  Stress -spoke about coping mechanisms    Medical Consultants:    cards  Discharge Exam:   Vitals:   05/20/18 0540 05/20/18 1134  BP: (!) 143/70 133/65  Pulse: 62 71  Resp: 18 18  Temp: 98.3 F (36.8 C) 98.3 F (36.8 C)  SpO2: 98% 100%   Vitals:   05/20/18 0040 05/20/18 0540 05/20/18 0759 05/20/18 1134  BP: (!) 130/54 (!) 143/70  133/65  Pulse: 65 62  71  Resp: 16 18  18   Temp: 97.8 F (36.6 C) 98.3 F (36.8 C)  98.3 F (36.8 C)  TempSrc: Oral Oral  Oral  SpO2: 98% 98%  100%  Weight:   59.9  kg   Height:        General exam: Appears calm and comfortable.  The results of significant diagnostics from this hospitalization (including imaging, microbiology, ancillary and laboratory) are listed below for reference.     Procedures and Diagnostic Studies:   Dg Chest 2 View  Result Date: 05/19/2018 CLINICAL DATA:  Chest pain and shortness of breath EXAM: CHEST - 2 VIEW COMPARISON:  05/15/2018 FINDINGS: Mild chronic cardiomegaly, less prominent than before. Stable mild fine opacities at the bases attributed scarring or atelectasis. There is no edema, consolidation, effusion, or pneumothorax. IMPRESSION: Stable from prior.  No evidence of acute disease. Electronically Signed   By: Monte Fantasia M.D.   On: 05/19/2018 14:02   Ct Coronary Morph W/cta Cor W/score W/ca W/cm &/or Wo/cm  Addendum Date: 05/20/2018   ADDENDUM REPORT: 05/20/2018 12:17 CLINICAL DATA:  62 year old-female with chest pain. EXAM: Cardiac/Coronary  CT TECHNIQUE: The patient was scanned on a [employer]. FINDINGS: A 120 kV prospective scan was triggered in the descending thoracic aorta at 111 HU's. Axial non-contrast 3 mm slices were carried out through the heart. The data set was analyzed on a dedicated work station and scored using the [place]. Gantry rotation speed was 250 msecs and collimation was .6 mm. No beta blockade and 0.8 mg of sl NTG was given. The 3D data set was reconstructed in 5% intervals of the 67-82 % of the R-R  cycle. Diastolic phases were analyzed on a dedicated work station using MPR, MIP and VRT modes. The patient received 80 cc of contrast. Aorta: Normal size. Mild diffuse calcifications in the descending aorta. No dissection. Aortic Valve:  Trileaflet.  Minimal leaflets calcifications. Coronary Arteries:  Normal coronary origin.  Left dominance. Left main is a large and long artery that gives rise to LAD, ramus intermedius and LCX arteries. Left main has no plaque. LAD is a large  tortuous vessel that gives rise to two diagonal arteries and has no plaque. D1,2 are medium size arteries that have no plaque. RI is a medium size artery and has no plaque. LCX is a large dominant artery that gives rise to one large OM1 branch. PDA and PLA. There is no plaque. RCA is a large non-dominant artery that has no significant plaque. Other findings: Normal pulmonary vein drainage into the left atrium. Normal let atrial appendage without a thrombus. Normal size of the pulmonary artery. IMPRESSION: 1. Coronary calcium score of 0. This was 0 percentile for age and sex matched control. 2. Normal coronary origin with left dominance. 3. No evidence of CAD. Electronically Signed   By: Ena Dawley   On: 05/20/2018 12:17   Result Date: 05/20/2018 EXAM: OVER-READ INTERPRETATION  CT CHEST The following report is an over-read performed by radiologist Dr. Rolm Baptise of [facility] on 05/20/2018. This over-read does not include interpretation of cardiac or coronary anatomy or pathology. The coronary CTA interpretation by the cardiologist is attached. COMPARISON:  08/27/2013 FINDINGS: Vascular: Heart is upper limits normal in size. Visualized aorta normal caliber. Mediastinum/Nodes: No adenopathy in the lower mediastinum or hila. Lungs/Pleura: No confluent opacities or effusions. Posterior left diaphragmatic hernia containing fat. This is stable since prior study. Upper Abdomen: Imaging into the upper abdomen shows no acute findings. Musculoskeletal: Chest wall soft tissues are unremarkable. No acute bony abnormality. IMPRESSION: No acute or significant extracardiac abnormality. Electronically Signed: By: Rolm Baptise M.D. On: 05/20/2018 11:08     Labs:   Basic Metabolic Panel: Recent Labs  Lab 05/15/18 1108 05/19/18 1255 05/20/18 0452  NA 137 138 139  K 4.2 4.4 4.4  CL 107 102 106  CO2 21* 23 25  GLUCOSE 90 92 96  BUN 19 15 13   CREATININE 0.82 0.77 0.84  CALCIUM 9.6 9.6 9.4    GFR Estimated Creatinine Clearance: 56.3 mL/min (by C-G formula based on SCr of 0.84 mg/dL). Liver Function Tests: Recent Labs  Lab 05/19/18 1255 05/20/18 0452  AST 18 16  ALT 17 15  ALKPHOS 54 52  BILITOT 0.6 0.6  PROT 7.5 6.8  ALBUMIN 4.4 4.0   Recent Labs  Lab 05/19/18 1255  LIPASE 42   No results for input(s): AMMONIA in the last 168 hours. Coagulation profile No results for input(s): INR, PROTIME in the last 168 hours.  CBC: Recent Labs  Lab 05/15/18 1108 05/19/18 1652 05/20/18 0452  WBC 9.2 8.0 7.1  NEUTROABS  --  5.1  --   HGB 11.8* 11.6* 11.5*  HCT 37.9 36.3 36.3  MCV 89.6 89.9 89.2  PLT 278 271 249   Cardiac Enzymes: No results for input(s): CKTOTAL, CKMB, CKMBINDEX, TROPONINI in the last 168 hours. BNP: Invalid input(s): POCBNP CBG: No results for input(s): GLUCAP in the last 168 hours. D-Dimer No results for input(s): DDIMER in the last 72 hours. Hgb A1c Recent Labs    05/19/18 1652  HGBA1C 5.4   Lipid Profile Recent Labs  05/20/18 0452  CHOL 246*  HDL 122  LDLCALC 107*  TRIG 83  CHOLHDL 2.0   Thyroid function studies No results for input(s): TSH, T4TOTAL, T3FREE, THYROIDAB in the last 72 hours.  Invalid input(s): FREET3 Anemia work up No results for input(s): VITAMINB12, FOLATE, FERRITIN, TIBC, IRON, RETICCTPCT in the last 72 hours. Microbiology No results found for this or any previous visit (from the past 240 hour(s)).   Discharge Instructions:   Discharge Instructions    Diet - low sodium heart healthy   Complete by:  As directed    Increase activity slowly   Complete by:  As directed      Allergies as of 05/20/2018      Reactions   Tizanidine    Pt felt paranoid, homicidal, sweating, could not sleep    Acetaminophen    Upsets stomach    Ibuprofen Other (See Comments)   Pt states it causes stomach pains d/t hx of ulcers   Lactose Intolerance (gi) Other (See Comments)   Gas/Bloating/upset stomach       Medication List    TAKE these medications   albuterol 108 (90 Base) MCG/ACT inhaler Commonly known as:  PROVENTIL HFA;VENTOLIN HFA Inhale 2 puffs into the lungs every 6 (six) hours as needed for wheezing or shortness of breath.   amLODipine 5 MG tablet Commonly known as:  NORVASC Take 5 mg by mouth daily.   Azelastine HCl 137 MCG/SPRAY Soln Place 2 sprays into the nose 2 (two) times daily.   buPROPion 150 MG 24 hr tablet Commonly known as:  WELLBUTRIN XL TK 1 T PO QAM   clonazePAM 1 MG tablet Commonly known as:  KLONOPIN Take 1 mg by mouth 4 (four) times daily as needed for anxiety.   fluticasone 50 MCG/ACT nasal spray Commonly known as:  FLONASE SHAKE LIQUID AND USE 1 SPRAY IN EACH NOSTRIL EVERY DAY   lamoTRIgine 150 MG tablet Commonly known as:  LAMICTAL lamotrigine 150 mg tablet   magnesium oxide 400 (241.3 Mg) MG tablet Commonly known as:  MAG-OX Take 1 tablet (400 mg total) by mouth daily. Start taking on:  05/21/2018   pantoprazole 40 MG tablet Commonly known as:  PROTONIX Take 2 tablets (80 mg total) by mouth daily.      Follow-up Information    Minette Brine, FNP Follow up in 1 week(s).   Specialty:  General Practice Contact information: [address] [place] [address] [phone]        Sherren Mocha, MD .   Specialty:  Cardiology Contact information: [phone] [place] [address] [phone]            Time coordinating discharge: 25 min  Signed:  Geradine Girt DO  [facility] 05/20/2018, 2:22 PM

## 2018-05-20 NOTE — Progress Notes (Signed)
Echocardiogram 2D Echocardiogram has been performed.  Nichole Gibbs 05/20/2018, 8:36 AM

## 2018-05-21 NOTE — Consult Note (Signed)
            [facility] CM Primary Care Navigator  05/21/2018  Nichole Gibbs 07/28/1955 [id number]   Went to seepatient at the bedside to identify possible discharge needs butshewasalready dischargedhome per staff.  Per MD note,patientpresentedwith left-sided chest pain with associated dizziness which she came in the ED few days ago for same reason. (gastroesophageal reflux disease, chest pain, stress)  Patient has discharge instruction to follow-upwith primary care provider in 1 week and cardiology follow-up in 2 weeks.   For additional questions please contact:  Edwena Felty A. Bebe Moncure, BSN, RN-BC [facility] PRIMARY CARE Navigator Cell: [phone]

## 2018-05-27 ENCOUNTER — Other Ambulatory Visit: Payer: Self-pay | Admitting: Nurse Practitioner

## 2018-05-30 DIAGNOSIS — F900 Attention-deficit hyperactivity disorder, predominantly inattentive type: Secondary | ICD-10-CM | POA: Diagnosis not present

## 2018-05-30 DIAGNOSIS — F314 Bipolar disorder, current episode depressed, severe, without psychotic features: Secondary | ICD-10-CM | POA: Diagnosis not present

## 2018-06-03 ENCOUNTER — Inpatient Hospital Stay: Payer: Self-pay | Admitting: Nurse Practitioner

## 2018-06-03 ENCOUNTER — Telehealth: Payer: Self-pay

## 2018-06-03 ENCOUNTER — Inpatient Hospital Stay: Payer: Medicare Other | Admitting: Nurse Practitioner

## 2018-06-03 DIAGNOSIS — J343 Hypertrophy of nasal turbinates: Secondary | ICD-10-CM | POA: Diagnosis not present

## 2018-06-03 DIAGNOSIS — J3489 Other specified disorders of nose and nasal sinuses: Secondary | ICD-10-CM | POA: Diagnosis not present

## 2018-06-03 DIAGNOSIS — J329 Chronic sinusitis, unspecified: Secondary | ICD-10-CM | POA: Diagnosis not present

## 2018-06-03 DIAGNOSIS — J342 Deviated nasal septum: Secondary | ICD-10-CM | POA: Diagnosis not present

## 2018-06-03 DIAGNOSIS — J310 Chronic rhinitis: Secondary | ICD-10-CM | POA: Diagnosis not present

## 2018-06-03 NOTE — Telephone Encounter (Signed)
Patient called to cancel appointment she stated she is too sick to come in and will call back to reschedule.  Returned pt call to reschedule her appointment and patient declined she stated her pain level is an 11 and she just cant reschedule right now. YRL,RMA

## 2018-06-05 DIAGNOSIS — F314 Bipolar disorder, current episode depressed, severe, without psychotic features: Secondary | ICD-10-CM | POA: Diagnosis not present

## 2018-06-05 DIAGNOSIS — F900 Attention-deficit hyperactivity disorder, predominantly inattentive type: Secondary | ICD-10-CM | POA: Diagnosis not present

## 2018-06-07 ENCOUNTER — Telehealth: Payer: Self-pay

## 2018-06-07 NOTE — Telephone Encounter (Signed)
Patient called to reschedule her appointment and she also called to let us know she went to work and her left eye started hurting and she is feeling nauseaous she stated she found her some pain pills and took half of one and she is going to wait until she has an appointment with [place] to be seen.   Returned pt call and left a v/m to call office to schedule an appointment. YRL,RMA

## 2018-06-10 ENCOUNTER — Encounter

## 2018-06-10 ENCOUNTER — Ambulatory Visit: Payer: Self-pay | Admitting: Nurse Practitioner

## 2018-06-10 NOTE — Progress Notes (Deleted)
CARDIOLOGY OFFICE NOTE  Date:  06/10/2018    Oscar La Date of Birth: 1955-10-23 Medical Record #[id number]  PCP:  Minette Brine, FNP  Cardiologist:  Jerel Shepherd    No chief complaint on file.   History of Present Illness: Nichole Gibbs is a 62 y.o. female who presents today for a follow up visit. Seen for Dr. Burt Knack. She has not been seen since 06/2015.   She is followed for mitral valve prolapse with mitral regurgitation. She also has essential hypertension.   Admitted in July of 2016 with suicide attempt. In the ER back in October of 2016 with severe major depressive disorder with psychotic features/suicidal ideations.   Last seen almost 3 years ago - cardiac status felt to be ok.    Past Medical History:  Diagnosis Date  . Anemia   . Anxiety   . Bipolar 1 disorder ([place])   . Chest pain    denies at this time 10/7  . Depression   . GERD (gastroesophageal reflux disease)   . Hypertension   . Memory loss    pt states mild memory loss  . Mitral valve prolapse    cardiologist-   dr Burt Knack  . Moderate mitral regurgitation   . Osteoarthritis   . Right patella fracture   . Rotator cuff syndrome    right side    Past Surgical History:  Procedure Laterality Date  . ANTERIOR CERVICAL DECOMP/DISCECTOMY FUSION  05-19-2009   C4 --  C7  and C5 corpectomy  . CARDIOVASCULAR STRESS TEST  11-20-2012  dr cooper   normal perfusion study/  no ischemia/  ef 62%  . CAUTERIZATION POST TONSILLECTOMY  09-25-2003  . CESAREAN SECTION    . PATELLECTOMY Right 04/03/2014   Procedure: PATELLECTOMY;  Surgeon: Sydnee Cabal, MD;  [facility];  Service: Orthopedics;  Laterality: Right;  . RE-EXPLORATION ANTERIOR CERVICAL WOUND AND EVACUATION HEMATOMA  05-19-2009  . TONSILLECTOMY  09-07-2003  . TRANSTHORACIC ECHOCARDIOGRAM  01-07-2014   grade II diastolic dysfunction/  ef 55-60%/  mild late systolic mitral valve prolapse of anterior  leaflet/  moderate MR     Medications: No outpatient medications have been marked as taking for the 06/10/18 encounter (Appointment) with Burtis Junes, NP.     Allergies: Allergies  Allergen Reactions  . Tizanidine     Pt felt paranoid, homicidal, sweating, could not sleep   . Acetaminophen     Upsets stomach   . Ibuprofen Other (See Comments)    Pt states it causes stomach pains d/t hx of ulcers  . Lactose Intolerance (Gi) Other (See Comments)    Gas/Bloating/upset stomach    Social History: The patient  reports that she has been smoking cigarettes. She has a 15.00 pack-year smoking history. She has never used smokeless tobacco. She reports previous alcohol use. She reports that she does not use drugs.   Family History: The patient's family history includes Bone cancer in her paternal grandfather; Cirrhosis in her paternal uncle; Colon polyps in her father; Esophageal cancer in her maternal uncle; Heart attack in her father; Heart disease in her mother; Liver cancer in her paternal uncle; Other in her maternal aunt; Skin cancer in her father; Uterine cancer in her paternal grandmother.   Review of Systems: Please see the history of present illness.   Otherwise, the review of systems is positive for none.   All other systems are reviewed and negative.   Physical Exam:  VS:  There were no vitals taken for this visit. Marland Kitchen  BMI There is no height or weight on file to calculate BMI.  Wt Readings from Last 3 Encounters:  05/20/18 132 lb 1.6 oz (59.9 kg)  05/15/18 135 lb (61.2 kg)  05/08/18 134 lb 3.2 oz (60.9 kg)    General: Pleasant. Well developed, well nourished and in no acute distress.   HEENT: Normal.  Neck: Supple, no JVD, carotid bruits, or masses noted.  Cardiac: Regular rate and rhythm. No murmurs, rubs, or gallops. No edema.  Respiratory:  Lungs are clear to auscultation bilaterally with normal work of breathing.  GI: Soft and nontender.  MS: No deformity or  atrophy. Gait and ROM intact.  Skin: Warm and dry. Color is normal.  Neuro:  Strength and sensation are intact and no gross focal deficits noted.  Psych: Alert, appropriate and with normal affect.   LABORATORY DATA:  EKG:  EKG is ordered today. This demonstrates .  Lab Results  Component Value Date   WBC 7.1 05/20/2018   HGB 11.5 (L) 05/20/2018   HCT 36.3 05/20/2018   PLT 249 05/20/2018   GLUCOSE 96 05/20/2018   CHOL 246 (H) 05/20/2018   TRIG 83 05/20/2018   HDL 122 05/20/2018   LDLCALC 107 (H) 05/20/2018   ALT 15 05/20/2018   AST 16 05/20/2018   NA 139 05/20/2018   K 4.4 05/20/2018   CL 106 05/20/2018   CREATININE 0.84 05/20/2018   BUN 13 05/20/2018   CO2 25 05/20/2018   TSH 0.609 01/07/2015   INR 1.04 05/19/2009   HGBA1C 5.4 05/19/2018     BNP (last 3 results) No results for input(s): BNP in the last 8760 hours.  ProBNP (last 3 results) No results for input(s): PROBNP in the last 8760 hours.   Other Studies Reviewed Today:  Coronary CT 04/2018 IMPRESSION: 1. Coronary calcium score of 0. This was 0 percentile for age and sex matched control.  2. Normal coronary origin with left dominance.  3. No evidence of CAD.   Electronically Signed   By: Ena Dawley   On: 05/20/2018 12:17   Echo Study Conclusions 04/2018  - Left ventricle: The cavity size was normal. Wall thickness was   normal. Systolic function was normal. The estimated ejection   fraction was in the range of 60% to 65%. Wall motion was normal;   there were no regional wall motion abnormalities. Doppler   parameters are consistent with high ventricular filling pressure. - Aortic valve: Transvalvular velocity was within the normal range.   There was no stenosis. There was no regurgitation. Mean gradient   (S): 4 mm Hg. - Mitral valve: Moderately thickened leaflets . Mild anterior   leaflet override, with posteriorly directed mitral regurgitation.   There was mild  regurgitation. - Left atrium: The atrium was moderately dilated. - Right ventricle: The cavity size was normal. Wall thickness was   normal. Systolic function was normal. RV systolic pressure (S,   est): 25 mm Hg. - Tricuspid valve: There was trivial regurgitation. - Pulmonary arteries: The main pulmonary artery was normal-sized.   Systolic pressure was within the normal range. - Inferior vena cava: The vessel was normal in size. The   respirophasic diameter changes were in the normal range (>= 50%),   consistent with normal central venous pressure.  Impressions:  - Compared to the previous exam 09/03/2015, no significant change   has occured. Mitral regurgitation was not quantitated today, but  by qualitative color flow, does not appear to have worsened. Side   by side comparison of images performed.    Notes Recorded by Imogene Burn, PA-C on 11/27/2012 at 8:15 AM Overall Impression: Normal stress nuclear study. No evidence of ischemia.    LV Ejection Fraction: 62%. LV Wall Motion: NL LV Function; NL Wall Motion      Assessment/Plan:  1. Recent admission for chest pain - no CAD per coronary CT  2. Known valvular disease - recent echo stable  3. HTN  4. Anxiety/situational stress -    Current medicines are reviewed with the patient today.  The patient does not have concerns regarding medicines other than what has been noted above.  The following changes have been made:  See above.  Labs/ tests ordered today include:   No orders of the defined types were placed in this encounter.    Disposition:   FU with *** in {gen number [id number]} {Days to years:10300}.   Patient is agreeable to this plan and will call if any problems develop in the interim.   SignedTruitt Merle, NP  06/10/2018 1:38 PM  [place] [address] [place], [place]  [address] Phone: [phone] Fax: [phone]

## 2018-06-11 DIAGNOSIS — G894 Chronic pain syndrome: Secondary | ICD-10-CM | POA: Diagnosis not present

## 2018-06-11 DIAGNOSIS — M179 Osteoarthritis of knee, unspecified: Secondary | ICD-10-CM | POA: Diagnosis not present

## 2018-06-11 DIAGNOSIS — Z5181 Encounter for therapeutic drug level monitoring: Secondary | ICD-10-CM | POA: Diagnosis not present

## 2018-06-11 DIAGNOSIS — M5136 Other intervertebral disc degeneration, lumbar region: Secondary | ICD-10-CM | POA: Diagnosis not present

## 2018-06-12 ENCOUNTER — Encounter: Payer: Self-pay | Admitting: Nurse Practitioner

## 2018-06-12 ENCOUNTER — Inpatient Hospital Stay: Payer: Self-pay | Admitting: Nurse Practitioner

## 2018-06-24 ENCOUNTER — Ambulatory Visit (INDEPENDENT_AMBULATORY_CARE_PROVIDER_SITE_OTHER): Payer: Medicare Other | Admitting: Nurse Practitioner

## 2018-06-24 ENCOUNTER — Encounter: Payer: Self-pay | Admitting: Nurse Practitioner

## 2018-06-24 VITALS — BP 142/84 | HR 71 | Temp 98.3°F | Wt 133.6 lb

## 2018-06-24 DIAGNOSIS — Z09 Encounter for follow-up examination after completed treatment for conditions other than malignant neoplasm: Secondary | ICD-10-CM

## 2018-06-24 DIAGNOSIS — R079 Chest pain, unspecified: Secondary | ICD-10-CM

## 2018-06-24 DIAGNOSIS — Z8679 Personal history of other diseases of the circulatory system: Secondary | ICD-10-CM

## 2018-06-24 DIAGNOSIS — I10 Essential (primary) hypertension: Secondary | ICD-10-CM

## 2018-06-24 DIAGNOSIS — K5909 Other constipation: Secondary | ICD-10-CM | POA: Diagnosis not present

## 2018-06-24 MED ORDER — LUBIPROSTONE 8 MCG PO CAPS: 8.0000 ug | ORAL_CAPSULE | Freq: Two times a day (BID) | ORAL | 2 refills | Status: DC

## 2018-06-24 NOTE — Progress Notes (Signed)
Subjective:     Patient ID: Nichole Gibbs , female    DOB: Oct 28, 1955 , 62 y.o.   MRN: [id number]   Chief Complaint  Patient presents with  . Hospitalization Follow-up    HPI  She is here for hospital follow up after having chest pain, she had a cardiac work up which was negative.  She had 2 tabs of nitroglycerin with relief however cardiac work up negative.  She reports she was told may be bowel obstruction.  She is now going to a different psychiatrist that she found herself and feels is going well.   Constipation  This is a chronic problem. The current episode started more than 1 year ago. The problem is unchanged. Her stool frequency is 2 to 3 times per week. The patient is on a high fiber diet. She does not exercise regularly. There has not been adequate water intake. Pertinent negatives include no abdominal pain (lateral abdomen), fecal incontinence, flatus, nausea or vomiting. Risk factors include stress. The treatment provided mild relief. There is no history of irritable bowel syndrome.     Past Medical History:  Diagnosis Date  . Anemia   . Anxiety   . Bipolar 1 disorder ([place])   . Chest pain    denies at this time 10/7  . Depression   . GERD (gastroesophageal reflux disease)   . Hypertension   . Memory loss    pt states mild memory loss  . Mitral valve prolapse    cardiologist-   dr Burt Knack  . Moderate mitral regurgitation   . Osteoarthritis   . Right patella fracture   . Rotator cuff syndrome    right side     Family History  Problem Relation Age of Onset  . Heart disease Mother   . Heart attack Father   . Colon polyps Father   . Skin cancer Father   . Esophageal cancer Maternal Uncle   . Other Maternal Aunt        stomcah polyps  . Bone cancer Paternal Grandfather   . Uterine cancer Paternal Grandmother   . Liver cancer Paternal Uncle   . Cirrhosis Paternal Uncle      Current Outpatient Medications:  .  Azelastine HCl 137 MCG/SPRAY SOLN,  Place 2 sprays into the nose 2 (two) times daily., Disp: 30 mL, Rfl: 3 .  buPROPion (WELLBUTRIN XL) 150 MG 24 hr tablet, TK 1 T PO QAM, Disp: , Rfl:  .  fluticasone (FLONASE) 50 MCG/ACT nasal spray, SHAKE LIQUID AND USE 1 SPRAY IN EACH NOSTRIL EVERY DAY, Disp: 16 g, Rfl: 5 .  lamoTRIgine (LAMICTAL) 150 MG tablet, lamotrigine 150 mg tablet daily, Disp: , Rfl:  .  magnesium oxide (MAG-OX) 400 (241.3 Mg) MG tablet, Take 1 tablet (400 mg total) by mouth daily., Disp: , Rfl:  .  albuterol (PROVENTIL HFA;VENTOLIN HFA) 108 (90 Base) MCG/ACT inhaler, Inhale 2 puffs into the lungs every 6 (six) hours as needed for wheezing or shortness of breath. (Patient not taking: Reported on 06/24/2018), Disp: 1 Inhaler, Rfl: 2 .  amLODipine (NORVASC) 2.5 MG tablet, TAKE 1 TABLET BY MOUTH EVERY DAY, Disp: 90 tablet, Rfl: 1 .  amLODipine (NORVASC) 5 MG tablet, Take 5 mg by mouth daily., Disp: , Rfl: 1 .  clonazePAM (KLONOPIN) 1 MG tablet, Take 1 mg by mouth 4 (four) times daily as needed for anxiety. , Disp: , Rfl: 3 .  pantoprazole (PROTONIX) 40 MG tablet, Take 2 tablets (80 mg total)  by mouth daily., Disp: , Rfl:    Allergies  Allergen Reactions  . Tizanidine     Pt felt paranoid, homicidal, sweating, could not sleep   . Acetaminophen     Upsets stomach   . Ibuprofen Other (See Comments)    Pt states it causes stomach pains d/t hx of ulcers  . Lactose Intolerance (Gi) Other (See Comments)    Gas/Bloating/upset stomach     Review of Systems  Constitutional: Negative.   HENT: Negative.   Eyes: Negative.   Respiratory: Negative.   Cardiovascular: Negative.   Gastrointestinal: Positive for constipation. Negative for abdominal pain (lateral abdomen), flatus, nausea and vomiting.  Psychiatric/Behavioral: Negative for agitation and confusion. The patient is not nervous/anxious.      Today's Vitals   06/24/18 1131  BP: (!) 142/84  Pulse: 71  Temp: 98.3 F (36.8 C)  TempSrc: Oral  SpO2: 97%  Weight: 133  lb 9.6 oz (60.6 kg)  PainSc: 4   PainLoc: Hip   Body mass index is 24.44 kg/m.   Objective:  Physical Exam Vitals signs reviewed.  Constitutional:      Appearance: Normal appearance.  Cardiovascular:     Rate and Rhythm: Normal rate.     Pulses: Normal pulses.     Heart sounds: Normal heart sounds. No murmur.  Pulmonary:     Effort: Pulmonary effort is normal.     Breath sounds: Normal breath sounds.  Abdominal:     General: Abdomen is flat. Bowel sounds are normal. There is no distension.     Palpations: Abdomen is soft. There is no mass.     Tenderness: There is abdominal tenderness (left upper quadrant).  Skin:    General: Skin is warm.  Neurological:     General: No focal deficit present.     Mental Status: She is alert and oriented to person, place, and time.  Psychiatric:        Attention and Perception: Attention and perception normal.        Mood and Affect: Mood is depressed. Mood is not anxious. Affect is flat.        Speech: Speech normal.        Behavior: Behavior normal.        Cognition and Memory: Cognition and memory normal.         Assessment And Plan:     1. Chest pain, unspecified type  Admitted for one day on 05/19/18-05/20/18 with chest pain, cardiac work up negative.    Has now resolved.    2. Other constipation  She has tried Linzess and Amitiza in the past however she is willing to take Amitiza again to see if she has better results.  I will also check a KUB due to having left upper quadrant pain on palpation - DG Abd 1 View; Future - lubiprostone (AMITIZA) 8 MCG capsule; Take 1 capsule (8 mcg total) by mouth 2 (two) times daily with a meal.  Dispense: 30 capsule; Refill: [place], FNP

## 2018-07-01 DIAGNOSIS — F314 Bipolar disorder, current episode depressed, severe, without psychotic features: Secondary | ICD-10-CM | POA: Diagnosis not present

## 2018-07-03 ENCOUNTER — Telehealth: Payer: Self-pay

## 2018-07-03 NOTE — Telephone Encounter (Signed)
Patient called stating at her last appt Nichole Brine FNP-BC suggested that she gets an xray of her abdomen but she didn't know if she was supposed to get some paperwork or something to take with her.  Returned pt call and advised her that the order was placed in the computer and they will be contacted her for an appointment. YRL,RMA

## 2018-07-09 DIAGNOSIS — M179 Osteoarthritis of knee, unspecified: Secondary | ICD-10-CM | POA: Diagnosis not present

## 2018-07-09 DIAGNOSIS — G894 Chronic pain syndrome: Secondary | ICD-10-CM | POA: Diagnosis not present

## 2018-07-09 DIAGNOSIS — Z5181 Encounter for therapeutic drug level monitoring: Secondary | ICD-10-CM | POA: Diagnosis not present

## 2018-07-09 DIAGNOSIS — M5136 Other intervertebral disc degeneration, lumbar region: Secondary | ICD-10-CM | POA: Diagnosis not present

## 2018-07-15 ENCOUNTER — Telehealth: Payer: Self-pay

## 2018-07-15 NOTE — Telephone Encounter (Signed)
I have received a call from the pt stating that she needs a referral from the provider for her colon cancer screening and also x-ray of the colon.   I have spoken to [facility] and the referral for [facility](Dr. Michail Sermon office) for the colon caner screening has been placed. The Abdominal x-ray has been placed since 12/30, however she can go to [facility] as a walk in. I have informed this to the pt and she has stated understanding.  Thanks, [employer]

## 2018-07-16 ENCOUNTER — Ambulatory Visit
Admission: RE | Admit: 2018-07-16 | Discharge: 2018-07-16 | Disposition: A | Discharge status: Home / Self Care | Payer: Medicare Other | Source: Ambulatory Visit | Attending: Nurse Practitioner | Admitting: Nurse Practitioner

## 2018-07-16 DIAGNOSIS — R109 Unspecified abdominal pain: Secondary | ICD-10-CM | POA: Diagnosis not present

## 2018-07-16 DIAGNOSIS — K5909 Other constipation: Secondary | ICD-10-CM

## 2018-07-17 ENCOUNTER — Other Ambulatory Visit: Payer: Self-pay | Admitting: Nurse Practitioner

## 2018-07-17 DIAGNOSIS — Z1211 Encounter for screening for malignant neoplasm of colon: Secondary | ICD-10-CM

## 2018-07-18 DIAGNOSIS — R1084 Generalized abdominal pain: Secondary | ICD-10-CM | POA: Diagnosis not present

## 2018-07-18 DIAGNOSIS — K5900 Constipation, unspecified: Secondary | ICD-10-CM | POA: Diagnosis not present

## 2018-07-18 DIAGNOSIS — Z800 Family history of malignant neoplasm of digestive organs: Secondary | ICD-10-CM | POA: Diagnosis not present

## 2018-07-25 NOTE — Progress Notes (Signed)
I am not familiar with anyone who does. She can check online.

## 2018-07-26 DIAGNOSIS — K6389 Other specified diseases of intestine: Secondary | ICD-10-CM | POA: Diagnosis not present

## 2018-07-26 DIAGNOSIS — Z800 Family history of malignant neoplasm of digestive organs: Secondary | ICD-10-CM | POA: Diagnosis not present

## 2018-07-26 DIAGNOSIS — K640 First degree hemorrhoids: Secondary | ICD-10-CM | POA: Diagnosis not present

## 2018-07-26 DIAGNOSIS — Z1211 Encounter for screening for malignant neoplasm of colon: Secondary | ICD-10-CM | POA: Diagnosis not present

## 2018-07-26 DIAGNOSIS — K635 Polyp of colon: Secondary | ICD-10-CM | POA: Diagnosis not present

## 2018-07-30 DIAGNOSIS — K6389 Other specified diseases of intestine: Secondary | ICD-10-CM | POA: Diagnosis not present

## 2018-07-30 DIAGNOSIS — K635 Polyp of colon: Secondary | ICD-10-CM | POA: Diagnosis not present

## 2018-07-30 DIAGNOSIS — Z1211 Encounter for screening for malignant neoplasm of colon: Secondary | ICD-10-CM | POA: Diagnosis not present

## 2018-08-06 DIAGNOSIS — M9931 Osseous stenosis of neural canal of cervical region: Secondary | ICD-10-CM | POA: Diagnosis not present

## 2018-08-06 DIAGNOSIS — M48062 Spinal stenosis, lumbar region with neurogenic claudication: Secondary | ICD-10-CM | POA: Diagnosis not present

## 2018-08-06 DIAGNOSIS — G8929 Other chronic pain: Secondary | ICD-10-CM | POA: Diagnosis not present

## 2018-08-06 DIAGNOSIS — M47897 Other spondylosis, lumbosacral region: Secondary | ICD-10-CM | POA: Diagnosis not present

## 2018-08-06 DIAGNOSIS — G894 Chronic pain syndrome: Secondary | ICD-10-CM | POA: Diagnosis not present

## 2018-08-06 DIAGNOSIS — M9951 Intervertebral disc stenosis of neural canal of cervical region: Secondary | ICD-10-CM | POA: Diagnosis not present

## 2018-08-06 DIAGNOSIS — M792 Neuralgia and neuritis, unspecified: Secondary | ICD-10-CM | POA: Diagnosis not present

## 2018-08-13 DIAGNOSIS — K5900 Constipation, unspecified: Secondary | ICD-10-CM | POA: Diagnosis not present

## 2018-08-13 DIAGNOSIS — R1013 Epigastric pain: Secondary | ICD-10-CM | POA: Diagnosis not present

## 2018-08-21 ENCOUNTER — Other Ambulatory Visit: Payer: Self-pay | Admitting: Nurse Practitioner

## 2018-08-21 DIAGNOSIS — R1084 Generalized abdominal pain: Secondary | ICD-10-CM

## 2018-08-27 DIAGNOSIS — K3189 Other diseases of stomach and duodenum: Secondary | ICD-10-CM | POA: Diagnosis not present

## 2018-08-27 DIAGNOSIS — R1013 Epigastric pain: Secondary | ICD-10-CM | POA: Diagnosis not present

## 2018-08-27 DIAGNOSIS — K319 Disease of stomach and duodenum, unspecified: Secondary | ICD-10-CM | POA: Diagnosis not present

## 2018-08-27 DIAGNOSIS — K2900 Acute gastritis without bleeding: Secondary | ICD-10-CM | POA: Diagnosis not present

## 2018-09-02 DIAGNOSIS — K319 Disease of stomach and duodenum, unspecified: Secondary | ICD-10-CM | POA: Diagnosis not present

## 2018-09-02 DIAGNOSIS — G894 Chronic pain syndrome: Secondary | ICD-10-CM | POA: Diagnosis not present

## 2018-09-30 DIAGNOSIS — G894 Chronic pain syndrome: Secondary | ICD-10-CM | POA: Diagnosis not present

## 2018-10-03 ENCOUNTER — Ambulatory Visit (INDEPENDENT_AMBULATORY_CARE_PROVIDER_SITE_OTHER): Payer: Medicare Other | Admitting: Nurse Practitioner

## 2018-10-03 ENCOUNTER — Other Ambulatory Visit: Payer: Self-pay

## 2018-10-03 ENCOUNTER — Encounter: Payer: Self-pay | Admitting: Nurse Practitioner

## 2018-10-03 VITALS — BP 134/68 | HR 77 | Temp 98.3°F | Ht 62.6 in | Wt 136.4 lb

## 2018-10-03 DIAGNOSIS — I10 Essential (primary) hypertension: Secondary | ICD-10-CM

## 2018-10-03 DIAGNOSIS — F329 Major depressive disorder, single episode, unspecified: Secondary | ICD-10-CM

## 2018-10-03 DIAGNOSIS — F32A Depression, unspecified: Secondary | ICD-10-CM

## 2018-10-03 DIAGNOSIS — R1084 Generalized abdominal pain: Secondary | ICD-10-CM | POA: Diagnosis not present

## 2018-10-03 NOTE — Progress Notes (Signed)
Subjective:     Patient ID: Nichole Gibbs , female    DOB: 1956/06/15 , 63 y.o.   MRN: [id number]   Chief Complaint  Patient presents with  . Abdominal Pain    HPI  Seen by Dr. Michail Sermon - she is on linzess for constipation.  He feels was related to taking amlodipine.    Abdominal Pain  This is a chronic problem. The current episode started more than 1 year ago. The onset quality is sudden. The problem occurs constantly. Pertinent negatives include no headaches.     Past Medical History:  Diagnosis Date  . Anemia   . Anxiety   . Bipolar 1 disorder ([place])   . Chest pain    denies at this time 10/7  . Depression   . GERD (gastroesophageal reflux disease)   . Hypertension   . Memory loss    pt states mild memory loss  . Mitral valve prolapse    cardiologist-   dr Burt Knack  . Moderate mitral regurgitation   . Osteoarthritis   . Right patella fracture   . Rotator cuff syndrome    right side     Family History  Problem Relation Age of Onset  . Heart disease Mother   . Heart attack Father   . Colon polyps Father   . Skin cancer Father   . Esophageal cancer Maternal Uncle   . Other Maternal Aunt        stomcah polyps  . Bone cancer Paternal Grandfather   . Uterine cancer Paternal Grandmother   . Liver cancer Paternal Uncle   . Cirrhosis Paternal Uncle      Current Outpatient Medications:  .  albuterol (PROVENTIL HFA;VENTOLIN HFA) 108 (90 Base) MCG/ACT inhaler, Inhale 2 puffs into the lungs every 6 (six) hours as needed for wheezing or shortness of breath., Disp: 1 Inhaler, Rfl: 2 .  amLODipine (NORVASC) 2.5 MG tablet, TAKE 1 TABLET BY MOUTH EVERY DAY, Disp: 90 tablet, Rfl: 1 .  Azelastine HCl 137 MCG/SPRAY SOLN, Place 2 sprays into the nose 2 (two) times daily., Disp: 30 mL, Rfl: 3 .  buPROPion (WELLBUTRIN XL) 150 MG 24 hr tablet, TK 1 T PO QAM, Disp: , Rfl:  .  fluticasone (FLONASE) 50 MCG/ACT nasal spray, SHAKE LIQUID AND USE 1 SPRAY IN EACH NOSTRIL EVERY DAY,  Disp: 16 g, Rfl: 5 .  gabapentin (NEURONTIN) 300 MG capsule, Take 300 mg by mouth 3 (three) times daily., Disp: , Rfl:  .  lamoTRIgine (LAMICTAL) 150 MG tablet, lamotrigine 150 mg tablet daily, Disp: , Rfl:  .  linaclotide (LINZESS) 72 MCG capsule, Take 72 mcg by mouth daily before breakfast., Disp: , Rfl:  .  lubiprostone (AMITIZA) 8 MCG capsule, Take 1 capsule (8 mcg total) by mouth 2 (two) times daily with a meal., Disp: 30 capsule, Rfl: 2 .  pantoprazole (PROTONIX) 40 MG tablet, Take 2 tablets (80 mg total) by mouth daily., Disp: , Rfl:  .  amLODipine (NORVASC) 5 MG tablet, Take 5 mg by mouth daily., Disp: , Rfl: 1 .  magnesium oxide (MAG-OX) 400 (241.3 Mg) MG tablet, Take 1 tablet (400 mg total) by mouth daily. (Patient not taking: Reported on 10/03/2018), Disp: , Rfl:    Allergies  Allergen Reactions  . Tizanidine     Pt felt paranoid, homicidal, sweating, could not sleep   . Acetaminophen     Upsets stomach   . Ibuprofen Other (See Comments)    Pt states it  causes stomach pains d/t hx of ulcers  . Lactose Intolerance (Gi) Other (See Comments)    Gas/Bloating/upset stomach     Review of Systems  Eyes: Negative for photophobia.  Respiratory: Negative for cough.   Cardiovascular: Negative.  Negative for chest pain, palpitations and leg swelling.  Gastrointestinal: Positive for abdominal pain.  Musculoskeletal: Negative.   Neurological: Negative for dizziness and headaches.     Today's Vitals   10/03/18 1119  BP: 134/68  Pulse: 77  Temp: 98.3 F (36.8 C)  TempSrc: Oral  SpO2: 96%  Weight: 136 lb 6.4 oz (61.9 kg)  Height: 5' 2.6" (1.59 m)   Body mass index is 24.47 kg/m.   Objective:  Physical Exam Vitals signs reviewed.  Constitutional:      Appearance: She is well-developed. She is not ill-appearing.  Cardiovascular:     Rate and Rhythm: Normal rate and regular rhythm.  Abdominal:     General: Abdomen is flat. Bowel sounds are normal. There is no distension or  abdominal bruit.     Palpations: Abdomen is soft. There is no shifting dullness or mass.  Neurological:     Mental Status: She is alert.         Assessment And Plan:     1. Generalized abdominal pain  Follow up for her abdominal pain, she has been to GI and feels she has constipation she is taking linzess  She reports pain is slightly better  No abnormal findings on physical exam  2. Essential hypertension . B/P is controlled.  . CMP ordered to check renal function.  . The importance of regular exercise and dietary modification  3. Depression, unspecified depression type  She is being followed by behavior health currently.  I have made several referrals and the most recent she advised them she had chosen another provider  Encouraged her to follow up with behavioral health    Minette Brine, FNP    THE PATIENT IS ENCOURAGED TO PRACTICE SOCIAL DISTANCING DUE TO THE COVID-19 PANDEMIC.

## 2018-10-09 ENCOUNTER — Telehealth: Payer: Self-pay | Admitting: Obstetrics and Gynecology

## 2018-10-09 ENCOUNTER — Other Ambulatory Visit: Payer: Self-pay

## 2018-10-09 ENCOUNTER — Encounter: Payer: Medicare Other | Admitting: Obstetrics & Gynecology

## 2018-10-09 NOTE — Telephone Encounter (Signed)
The patient stated she would rather wait until she can have the face to face visit as she does not want to complete a virtual visit. Placing the patient on the wait list.

## 2018-10-20 ENCOUNTER — Encounter: Payer: Self-pay | Admitting: Nurse Practitioner

## 2018-10-28 DIAGNOSIS — G894 Chronic pain syndrome: Secondary | ICD-10-CM | POA: Diagnosis not present

## 2018-10-30 ENCOUNTER — Telehealth: Payer: Self-pay

## 2018-10-30 ENCOUNTER — Other Ambulatory Visit: Payer: Self-pay | Admitting: Nurse Practitioner

## 2018-10-30 DIAGNOSIS — F32A Depression, unspecified: Secondary | ICD-10-CM

## 2018-10-30 DIAGNOSIS — F329 Major depressive disorder, single episode, unspecified: Secondary | ICD-10-CM

## 2018-10-30 NOTE — Telephone Encounter (Signed)
Patient called about a referral to therapist stated she has not received a call.   REFERRAL HAS BEEN RESUBMITTED AND ADVISED PT TO MAKE SURE SHE DOES NOT DENY THE APPOINTMENT BECAUSE WE WILL NOT DO ANOTHER ONE DUE TO ALREADY PUTTING IN MULTIPLE REFERRAL. YRL,RMA

## 2018-10-31 DIAGNOSIS — F3181 Bipolar II disorder: Secondary | ICD-10-CM | POA: Diagnosis not present

## 2018-11-05 ENCOUNTER — Telehealth: Payer: Self-pay

## 2018-11-05 NOTE — Telephone Encounter (Signed)
Patient called wanting the information for her referral for counseling.   RETURNED PT CALL AND PROVIDED HER WITH THE NAME OF THE PLACE ([place]) AND THEIR PHONE NUMBER. YRL,RMA

## 2018-11-13 ENCOUNTER — Other Ambulatory Visit: Payer: Self-pay | Admitting: Nurse Practitioner

## 2018-11-13 DIAGNOSIS — M4726 Other spondylosis with radiculopathy, lumbar region: Secondary | ICD-10-CM | POA: Diagnosis not present

## 2018-11-13 DIAGNOSIS — M48062 Spinal stenosis, lumbar region with neurogenic claudication: Secondary | ICD-10-CM | POA: Diagnosis not present

## 2018-11-13 DIAGNOSIS — M792 Neuralgia and neuritis, unspecified: Secondary | ICD-10-CM | POA: Diagnosis not present

## 2018-11-13 DIAGNOSIS — G894 Chronic pain syndrome: Secondary | ICD-10-CM | POA: Diagnosis not present

## 2018-11-13 DIAGNOSIS — M9931 Osseous stenosis of neural canal of cervical region: Secondary | ICD-10-CM | POA: Diagnosis not present

## 2018-11-13 DIAGNOSIS — R0789 Other chest pain: Secondary | ICD-10-CM

## 2018-11-13 DIAGNOSIS — M47897 Other spondylosis, lumbosacral region: Secondary | ICD-10-CM | POA: Diagnosis not present

## 2018-11-13 DIAGNOSIS — M4807 Spinal stenosis, lumbosacral region: Secondary | ICD-10-CM | POA: Diagnosis not present

## 2018-11-13 DIAGNOSIS — M9951 Intervertebral disc stenosis of neural canal of cervical region: Secondary | ICD-10-CM | POA: Diagnosis not present

## 2018-11-13 DIAGNOSIS — G8929 Other chronic pain: Secondary | ICD-10-CM | POA: Diagnosis not present

## 2018-11-14 ENCOUNTER — Other Ambulatory Visit: Payer: Self-pay | Admitting: Nurse Practitioner

## 2018-11-14 DIAGNOSIS — R0789 Other chest pain: Secondary | ICD-10-CM

## 2018-12-05 ENCOUNTER — Other Ambulatory Visit: Payer: Self-pay | Admitting: Nurse Practitioner

## 2018-12-16 ENCOUNTER — Ambulatory Visit: Payer: Medicare Other | Admitting: Licensed Clinical Social Worker

## 2018-12-24 DIAGNOSIS — G894 Chronic pain syndrome: Secondary | ICD-10-CM | POA: Diagnosis not present

## 2018-12-25 ENCOUNTER — Ambulatory Visit: Payer: Medicare Other | Admitting: Licensed Clinical Social Worker

## 2019-01-06 ENCOUNTER — Other Ambulatory Visit: Payer: Self-pay | Admitting: Nurse Practitioner

## 2019-01-08 ENCOUNTER — Other Ambulatory Visit: Payer: Self-pay | Admitting: Nurse Practitioner

## 2019-01-15 ENCOUNTER — Ambulatory Visit: Payer: Medicare Other | Admitting: Licensed Clinical Social Worker

## 2019-01-20 ENCOUNTER — Ambulatory Visit: Payer: Medicare Other | Attending: Nurse Practitioner | Admitting: Nurse Practitioner

## 2019-01-20 ENCOUNTER — Encounter: Payer: Self-pay | Admitting: Nurse Practitioner

## 2019-01-20 ENCOUNTER — Other Ambulatory Visit: Payer: Self-pay

## 2019-01-20 DIAGNOSIS — Z8371 Family history of colonic polyps: Secondary | ICD-10-CM | POA: Diagnosis not present

## 2019-01-20 DIAGNOSIS — K219 Gastro-esophageal reflux disease without esophagitis: Secondary | ICD-10-CM | POA: Insufficient documentation

## 2019-01-20 DIAGNOSIS — M1990 Unspecified osteoarthritis, unspecified site: Secondary | ICD-10-CM | POA: Insufficient documentation

## 2019-01-20 DIAGNOSIS — R413 Other amnesia: Secondary | ICD-10-CM | POA: Insufficient documentation

## 2019-01-20 DIAGNOSIS — Z1159 Encounter for screening for other viral diseases: Secondary | ICD-10-CM | POA: Diagnosis not present

## 2019-01-20 DIAGNOSIS — Z1211 Encounter for screening for malignant neoplasm of colon: Secondary | ICD-10-CM

## 2019-01-20 DIAGNOSIS — Z8249 Family history of ischemic heart disease and other diseases of the circulatory system: Secondary | ICD-10-CM | POA: Insufficient documentation

## 2019-01-20 DIAGNOSIS — F17200 Nicotine dependence, unspecified, uncomplicated: Secondary | ICD-10-CM

## 2019-01-20 DIAGNOSIS — Z808 Family history of malignant neoplasm of other organs or systems: Secondary | ICD-10-CM | POA: Diagnosis not present

## 2019-01-20 DIAGNOSIS — F333 Major depressive disorder, recurrent, severe with psychotic symptoms: Secondary | ICD-10-CM

## 2019-01-20 DIAGNOSIS — Z1231 Encounter for screening mammogram for malignant neoplasm of breast: Secondary | ICD-10-CM | POA: Diagnosis not present

## 2019-01-20 DIAGNOSIS — Z981 Arthrodesis status: Secondary | ICD-10-CM | POA: Diagnosis not present

## 2019-01-20 DIAGNOSIS — G8929 Other chronic pain: Secondary | ICD-10-CM | POA: Insufficient documentation

## 2019-01-20 DIAGNOSIS — I10 Essential (primary) hypertension: Secondary | ICD-10-CM

## 2019-01-20 DIAGNOSIS — K5900 Constipation, unspecified: Secondary | ICD-10-CM | POA: Insufficient documentation

## 2019-01-20 DIAGNOSIS — F17210 Nicotine dependence, cigarettes, uncomplicated: Secondary | ICD-10-CM | POA: Insufficient documentation

## 2019-01-20 DIAGNOSIS — Z7689 Persons encountering health services in other specified circumstances: Secondary | ICD-10-CM

## 2019-01-20 MED ORDER — BLOOD PRESSURE MONITOR DEVI: 0 refills | Status: DC

## 2019-01-20 MED ORDER — BLOOD PRESSURE MONITOR DEVI: 0 refills | Status: AC

## 2019-01-20 NOTE — Progress Notes (Signed)
Virtual Visit via Telephone Note Due to national recommendations of social distancing due to [place] 19, telehealth visit is felt to be most appropriate for this patient at this time.  I discussed the limitations, risks, security and privacy concerns of performing an evaluation and management service by telephone and the availability of in person appointments. I also discussed with the patient that there may be a patient responsible charge related to this service. The patient expressed understanding and agreed to proceed.    I connected with Oscar La on 01/20/19  at  10:30 AM EDT  EDT by telephone and verified that I am speaking with the correct person using two identifiers.   Consent I discussed the limitations, risks, security and privacy concerns of performing an evaluation and management service by telephone and the availability of in person appointments. I also discussed with the patient that there may be a patient responsible charge related to this service. The patient expressed understanding and agreed to proceed.   Location of Patient: Private  Residence   Location of Provider: [facility] [employer] Office    Persons participating in Telemedicine visit: Geryl Rankins FNP-BC [place]    History of Present Illness: Telemedicine visit for: ESTABLISH CARE  has a past medical history of Anemia, Anxiety, Bipolar 1 disorder ([place]), Chest pain, Depression, GERD (gastroesophageal reflux disease), Hypertension, Memory loss, Mitral valve prolapse, Moderate mitral regurgitation, Osteoarthritis, Right patella fracture, and Rotator cuff syndrome.   It was very difficult for Ms. Blevens to stay focused on one subject. Very tangential. She sees Dr. Noemi Chapel for Bipolar II disorder. Taking lamictal, gabapentin and wellbutrin XL.    Requesting to have her "liver checked". States she was told by her dentist that her chronic halitosis was not  related to any dental issues. However she has not had any scaling ever performed and states her dentist never discussed scaling or root cleaning with her. She has seen ENT in the past for chronic sinusitis. She also smokes cigarettes and has a history of GERD and constipation taking PPI and Linzess. Her liver function has been normal with last CMP 04-2018. She states she had a recent colonoscopy however I am unable to locate records of this.    Goes to the pain clinic for chronic pain in the back, knees and shoulder. She takes hydrocodone 39m. Notes a C5-6 Fusion in the past. She has been on disability for 10 years.   Essential Hypertension Well controlled. She does not monitor her blood pressure at home. Will send script for meter. Taking amlodipine 2.576mdaily. Denies chest pain, shortness of breath, palpitations, lightheadedness, dizziness, headaches or BLE edema.  BP Readings from Last 3 Encounters:  10/03/18 134/68  06/24/18 (!) 142/84  05/20/18 133/65   Past Medical History:  Diagnosis Date  . Anemia   . Anxiety   . [place] 1 [place]  . Chest pain    denies at this time 10/7  . Depression   . GERD (gastroesophageal reflux disease)   . Hypertension   . Memory loss    pt states mild memory loss  . Mitral valve prolapse    cardiologist-   dr coBurt Knack. Moderate mitral regurgitation   . Osteoarthritis   . Right patella fracture   . Rotator cuff syndrome    right side    Past Surgical History:  Procedure Laterality Date  . ANTERIOR CERVICAL DECOMP/DISCECTOMY FUSION  05-19-2009   C4 --  C7  and C5 corpectomy  . CARDIOVASCULAR STRESS TEST  11-20-2012  dr cooper   normal perfusion study/  no ischemia/  ef 62%  . CAUTERIZATION POST TONSILLECTOMY  09-25-2003  . CESAREAN SECTION    . PATELLECTOMY Right 04/03/2014   Procedure: PATELLECTOMY;  Surgeon: Sydnee Cabal, MD;  Location: [facility];  Service: Orthopedics;  Laterality: Right;  . RE-EXPLORATION  ANTERIOR CERVICAL WOUND AND EVACUATION HEMATOMA  05-19-2009  . TONSILLECTOMY  09-07-2003  . TRANSTHORACIC ECHOCARDIOGRAM  01-07-2014   grade II diastolic dysfunction/  ef 55-60%/  mild late systolic mitral valve prolapse of anterior leaflet/  moderate MR    Family History  Problem Relation Age of Onset  . Heart disease Mother   . Heart attack Father   . Colon polyps Father   . Skin cancer Father   . Esophageal cancer Maternal Uncle   . Other Maternal Aunt        stomcah polyps  . Bone cancer Paternal Grandfather   . Uterine cancer Paternal Grandmother   . Liver cancer Paternal Uncle   . Cirrhosis Paternal Uncle     Social History   Socioeconomic History  . Marital status: Divorced    Spouse name: Not on file  . Number of children: 3  . Years of education: Not on file  . Highest education level: Not on file  Occupational History  . Occupation: DISABILITY    Employer: UNEMPLOYED  Social Needs  . Financial resource strain: Not hard at all  . Food insecurity    Worry: Never true    Inability: Never true  . Transportation needs    Medical: No    Non-medical: No  Tobacco Use  . Smoking status: Current Every Day Smoker    Packs/day: 0.50    Years: 30.00    Pack years: 15.00    Types: Cigarettes  . Smokeless tobacco: Never Used  Substance and Sexual Activity  . Alcohol use: Not Currently  . Drug use: No  . Sexual activity: Not Currently  Lifestyle  . Physical activity    Days per week: 0 days    Minutes per session: 0 min  . Stress: Very much  Relationships  . Social Herbalist on phone: Not on file    Gets together: Not on file    Attends religious service: Not on file    Active member of club or organization: Not on file    Attends meetings of clubs or organizations: Not on file    Relationship status: Not on file  Other Topics Concern  . Not on file  Social History Narrative  . Not on file     Observations/Objective: Awake, alert and oriented  x 3   Review of Systems  Constitutional: Negative for fever, malaise/fatigue and weight loss.  HENT: Negative.  Negative for nosebleeds.   Eyes: Negative.  Negative for blurred vision, double vision and photophobia.  Respiratory: Negative.  Negative for cough and shortness of breath.   Cardiovascular: Negative.  Negative for chest pain, palpitations and leg swelling.  Gastrointestinal: Positive for constipation and heartburn. Negative for abdominal pain, blood in stool, diarrhea, melena, nausea and vomiting.  Musculoskeletal: Positive for back pain, joint pain and neck pain. Negative for myalgias.  Neurological: Negative.  Negative for dizziness, focal weakness, seizures and headaches.  Psychiatric/Behavioral: Positive for depression. Negative for suicidal ideas.    Assessment and Plan: Rozina was seen today for new patient (initial visit).  Diagnoses and all orders for this visit:  Encounter to establish care  Essential hypertension -     CBC; Future -     CMP14+EGFR; Future -     Lipid panel; Future -     Blood Pressure Monitor DEVI; Please provide patient with insurance approved blood pressure monitor Continue all antihypertensives as prescribed.  Remember to bring in your blood pressure log with you for your follow up appointment.  DASH/Mediterranean Diets are healthier choices for HTN.   Breast cancer screening by mammogram -     MM 3D SCREEN BREAST BILATERAL; Future  Major depressive disorder, recurrent, severe with psychotic symptoms ([place]) Continue follow up with PSYCHIATRY   Need for hepatitis C screening test -     Hepatitis C Antibody; Future  Tobacco Dependence Kalandra was counseled on the dangers of tobacco use, and was advised to quit. Reviewed strategies to maximize success, including removing cigarettes and smoking materials from environment, stress management and support of family/friends as well as pharmacological alternatives including: Wellbutrin,  Chantix, Nicotine patch, Nicotine gum or lozenges. Smoking cessation support: smoking cessation hotline: 1-800-QUIT-NOW.  Smoking cessation classes are also available through [facility]. Call [phone] or visit our website at [email or web address]   A total of 3 minutes was spent on counseling for smoking cessation and Avanthika is not ready to quit.     Follow Up Instructions Return in about 3 weeks (around 02/10/2019) for Review labs.     I discussed the assessment and treatment plan with the patient. The patient was provided an opportunity to ask questions and all were answered. The patient agreed with the plan and demonstrated an understanding of the instructions.   The patient was advised to call back or seek an in-person evaluation if the symptoms worsen or if the condition fails to improve as anticipated.  I provided 26 minutes of non-face-to-face time during this encounter including median intraservice time, reviewing previous notes, labs, imaging, medications and explaining diagnosis and management.  Gildardo Pounds, FNP-BC

## 2019-01-21 DIAGNOSIS — G894 Chronic pain syndrome: Secondary | ICD-10-CM | POA: Diagnosis not present

## 2019-01-24 ENCOUNTER — Ambulatory Visit: Payer: Medicare Other | Attending: Nurse Practitioner

## 2019-01-24 ENCOUNTER — Other Ambulatory Visit: Payer: Self-pay

## 2019-01-24 DIAGNOSIS — I10 Essential (primary) hypertension: Secondary | ICD-10-CM

## 2019-01-24 DIAGNOSIS — Z1159 Encounter for screening for other viral diseases: Secondary | ICD-10-CM

## 2019-01-28 ENCOUNTER — Telehealth: Payer: Self-pay | Admitting: Nurse Practitioner

## 2019-01-28 NOTE — Telephone Encounter (Signed)
New Message   Pt states she was suppose to get a scaling done at [facility], but they require an order form the physician. Please f/u

## 2019-01-29 ENCOUNTER — Other Ambulatory Visit: Payer: Medicare Other

## 2019-01-29 NOTE — Telephone Encounter (Signed)
Pt called with CORRECT dentist name Dr Lovena Neighbours DDS on  [facility]  Fax [phone].  Appt with dentist next week before appt with [facility].   Please fax ORDER for pt to have Scaling for bacterial infection underneath gums.

## 2019-01-30 NOTE — Telephone Encounter (Signed)
CMA called the Dentist and they said they did not need an order for scaling on their ends. Pt. Missed two of her appt with them and have an upcoming appt with them on 08/20th. Pt. Stated that PCP stated she need a scaling for her gums and requested if PCP can faxed over order for her dentist.   CMA informed Dentist office that PCP is currently out of office and will be back Monday.

## 2019-01-31 ENCOUNTER — Ambulatory Visit
Admission: RE | Admit: 2019-01-31 | Discharge: 2019-01-31 | Disposition: A | Discharge status: Home / Self Care | Payer: Medicare Other | Source: Ambulatory Visit | Attending: Nurse Practitioner | Admitting: Nurse Practitioner

## 2019-01-31 ENCOUNTER — Other Ambulatory Visit: Payer: Self-pay

## 2019-01-31 DIAGNOSIS — Z1231 Encounter for screening mammogram for malignant neoplasm of breast: Secondary | ICD-10-CM | POA: Diagnosis not present

## 2019-01-31 NOTE — Telephone Encounter (Signed)
CMA unable to reach patient.  CMA left a VM of PCP advising.

## 2019-01-31 NOTE — Telephone Encounter (Signed)
I suggested the patient speak to her dentist regarding scaling as she was concerned about halitosis. This would be up to the dentist to determine if it was warranted as I am not a specialist in oral procedures and it was merely a suggestion. I would not write an order for the dentist to perform this. Thanks.

## 2019-02-01 ENCOUNTER — Other Ambulatory Visit: Payer: Self-pay | Admitting: Nurse Practitioner

## 2019-02-01 DIAGNOSIS — R928 Other abnormal and inconclusive findings on diagnostic imaging of breast: Secondary | ICD-10-CM

## 2019-02-04 ENCOUNTER — Ambulatory Visit: Payer: Medicare Other | Attending: Nurse Practitioner

## 2019-02-04 ENCOUNTER — Other Ambulatory Visit: Payer: Self-pay

## 2019-02-04 DIAGNOSIS — I10 Essential (primary) hypertension: Secondary | ICD-10-CM | POA: Diagnosis not present

## 2019-02-04 DIAGNOSIS — Z1159 Encounter for screening for other viral diseases: Secondary | ICD-10-CM | POA: Diagnosis not present

## 2019-02-05 ENCOUNTER — Other Ambulatory Visit: Payer: Self-pay | Admitting: Nurse Practitioner

## 2019-02-05 ENCOUNTER — Ambulatory Visit: Payer: Medicare Other | Admitting: Licensed Clinical Social Worker

## 2019-02-05 ENCOUNTER — Ambulatory Visit: Payer: Medicare Other

## 2019-02-05 ENCOUNTER — Ambulatory Visit
Admission: RE | Admit: 2019-02-05 | Discharge: 2019-02-05 | Disposition: A | Discharge status: Home / Self Care | Payer: Medicare Other | Source: Ambulatory Visit | Attending: Nurse Practitioner | Admitting: Nurse Practitioner

## 2019-02-05 DIAGNOSIS — R928 Other abnormal and inconclusive findings on diagnostic imaging of breast: Secondary | ICD-10-CM

## 2019-02-05 DIAGNOSIS — R922 Inconclusive mammogram: Secondary | ICD-10-CM | POA: Diagnosis not present

## 2019-02-05 LAB — CMP14+EGFR
ALT: 13 IU/L (ref 0–32)
AST: 19 IU/L (ref 0–40)
Albumin/Globulin Ratio: 2.6 — ABNORMAL HIGH (ref 1.2–2.2)
Albumin: 4.9 g/dL — ABNORMAL HIGH (ref 3.8–4.8)
Alkaline Phosphatase: 66 IU/L (ref 39–117)
BUN/Creatinine Ratio: 10 — ABNORMAL LOW (ref 12–28)
BUN: 8 mg/dL (ref 8–27)
Bilirubin Total: 0.2 mg/dL (ref 0.0–1.2)
CO2: 22 mmol/L (ref 20–29)
Calcium: 9.2 mg/dL (ref 8.7–10.3)
Chloride: 101 mmol/L (ref 96–106)
Creatinine, Ser: 0.78 mg/dL (ref 0.57–1.00)
GFR calc Af Amer: 95 mL/min/{1.73_m2} (ref >59)
GFR calc non Af Amer: 82 mL/min/{1.73_m2} (ref >59)
Globulin, Total: 1.9 g/dL (ref 1.5–4.5)
Glucose: 83 mg/dL (ref 65–99)
Potassium: 4.5 mmol/L (ref 3.5–5.2)
Sodium: 138 mmol/L (ref 134–144)
Total Protein: 6.8 g/dL (ref 6.0–8.5)

## 2019-02-05 LAB — LIPID PANEL
Chol/HDL Ratio: 1.9 ratio (ref 0.0–4.4)
Cholesterol, Total: 264 mg/dL — ABNORMAL HIGH (ref 100–199)
HDL: 138 mg/dL (ref >39)
LDL Calculated: 112 mg/dL — ABNORMAL HIGH (ref 0–99)
Triglycerides: 69 mg/dL (ref 0–149)
VLDL Cholesterol Cal: 14 mg/dL (ref 5–40)

## 2019-02-05 LAB — CBC
Hematocrit: 37.1 % (ref 34.0–46.6)
Hemoglobin: 12.1 g/dL (ref 11.1–15.9)
MCH: 29 pg (ref 26.6–33.0)
MCHC: 32.6 g/dL (ref 31.5–35.7)
MCV: 89 fL (ref 79–97)
Platelets: 234 10*3/uL (ref 150–450)
RBC: 4.17 x10E6/uL (ref 3.77–5.28)
RDW: 14.6 % (ref 11.7–15.4)
WBC: 4.9 10*3/uL (ref 3.4–10.8)

## 2019-02-05 LAB — HEPATITIS C ANTIBODY: Hep C Virus Ab: <0.1 s/co ratio (ref 0.0–0.9)

## 2019-02-05 MED ORDER — ATORVASTATIN CALCIUM 20 MG PO TABS: 20.0000 mg | ORAL_TABLET | Freq: Every day | ORAL | 3 refills | Status: AC

## 2019-02-10 ENCOUNTER — Ambulatory Visit: Payer: Medicare Other | Admitting: Nurse Practitioner

## 2019-02-18 DIAGNOSIS — M4807 Spinal stenosis, lumbosacral region: Secondary | ICD-10-CM | POA: Diagnosis not present

## 2019-02-18 DIAGNOSIS — G894 Chronic pain syndrome: Secondary | ICD-10-CM | POA: Diagnosis not present

## 2019-02-18 DIAGNOSIS — M9931 Osseous stenosis of neural canal of cervical region: Secondary | ICD-10-CM | POA: Diagnosis not present

## 2019-02-18 DIAGNOSIS — M9951 Intervertebral disc stenosis of neural canal of cervical region: Secondary | ICD-10-CM | POA: Diagnosis not present

## 2019-02-18 DIAGNOSIS — G8929 Other chronic pain: Secondary | ICD-10-CM | POA: Diagnosis not present

## 2019-02-18 DIAGNOSIS — M4726 Other spondylosis with radiculopathy, lumbar region: Secondary | ICD-10-CM | POA: Diagnosis not present

## 2019-02-18 DIAGNOSIS — M792 Neuralgia and neuritis, unspecified: Secondary | ICD-10-CM | POA: Diagnosis not present

## 2019-02-18 DIAGNOSIS — M47897 Other spondylosis, lumbosacral region: Secondary | ICD-10-CM | POA: Diagnosis not present

## 2019-02-18 DIAGNOSIS — M48062 Spinal stenosis, lumbar region with neurogenic claudication: Secondary | ICD-10-CM | POA: Diagnosis not present

## 2019-02-24 ENCOUNTER — Ambulatory Visit: Payer: Medicare Other | Attending: Nurse Practitioner | Admitting: Nurse Practitioner

## 2019-02-24 ENCOUNTER — Encounter: Payer: Self-pay | Admitting: Nurse Practitioner

## 2019-02-24 ENCOUNTER — Other Ambulatory Visit: Payer: Self-pay

## 2019-02-24 VITALS — BP 146/70 | HR 68 | Temp 99.2°F | Ht 64.0 in | Wt 141.8 lb

## 2019-02-24 DIAGNOSIS — Z79899 Other long term (current) drug therapy: Secondary | ICD-10-CM | POA: Insufficient documentation

## 2019-02-24 DIAGNOSIS — I080 Rheumatic disorders of both mitral and aortic valves: Secondary | ICD-10-CM | POA: Diagnosis not present

## 2019-02-24 DIAGNOSIS — M1990 Unspecified osteoarthritis, unspecified site: Secondary | ICD-10-CM | POA: Diagnosis not present

## 2019-02-24 DIAGNOSIS — F319 Bipolar disorder, unspecified: Secondary | ICD-10-CM | POA: Diagnosis not present

## 2019-02-24 DIAGNOSIS — K219 Gastro-esophageal reflux disease without esophagitis: Secondary | ICD-10-CM | POA: Insufficient documentation

## 2019-02-24 DIAGNOSIS — Z8249 Family history of ischemic heart disease and other diseases of the circulatory system: Secondary | ICD-10-CM | POA: Diagnosis not present

## 2019-02-24 DIAGNOSIS — E782 Mixed hyperlipidemia: Secondary | ICD-10-CM

## 2019-02-24 DIAGNOSIS — F419 Anxiety disorder, unspecified: Secondary | ICD-10-CM | POA: Insufficient documentation

## 2019-02-24 DIAGNOSIS — F17200 Nicotine dependence, unspecified, uncomplicated: Secondary | ICD-10-CM | POA: Diagnosis not present

## 2019-02-24 DIAGNOSIS — F17210 Nicotine dependence, cigarettes, uncomplicated: Secondary | ICD-10-CM | POA: Diagnosis not present

## 2019-02-24 DIAGNOSIS — I10 Essential (primary) hypertension: Secondary | ICD-10-CM | POA: Diagnosis not present

## 2019-02-24 MED ORDER — AMLODIPINE BESYLATE 5 MG PO TABS: 5.0000 mg | ORAL_TABLET | Freq: Every day | ORAL | 0 refills | Status: DC

## 2019-02-24 MED ORDER — ALBUTEROL SULFATE HFA 108 (90 BASE) MCG/ACT IN AERS: INHALATION_SPRAY | RESPIRATORY_TRACT | 1 refills | Status: AC

## 2019-02-24 NOTE — Progress Notes (Signed)
Assessment & Plan:  Nichole Gibbs was seen today for follow-up.  Diagnoses and all orders for this visit:  Essential hypertension -     amLODipine (NORVASC) 5 MG tablet; Take 1 tablet (5 mg total) by mouth daily. Continue all antihypertensives as prescribed.  Remember to bring in your blood pressure log with you for your follow up appointment.  DASH/Mediterranean Diets are healthier choices for HTN.    Tobacco dependence -     albuterol (VENTOLIN HFA) 108 (90 Base) MCG/ACT inhaler; INHALE 2 PUFFS INTO THE LUNGS EVERY 6 HOURS AS NEEDED FOR WHEEZING OR SHORTNESS OF BREATH Nichole Gibbs was counseled on the dangers of tobacco use, and was advised to quit. Reviewed strategies to maximize success, including removing cigarettes and smoking materials from environment, stress management and support of family/friends as well as pharmacological alternatives including: Wellbutrin, Chantix, Nicotine patch, Nicotine gum or lozenges. Smoking cessation support: smoking cessation hotline: 1-800-QUIT-NOW.  Smoking cessation classes are also available through [facility]. Call [phone] or visit our website at [email or web address]   A total of 3 minutes was spent on counseling for smoking cessation and Nichole Gibbs is not ready to quit.    Mixed hyperlipidemia INSTRUCTIONS: Work on a low fat, heart healthy diet and participate in regular aerobic exercise program by working out at least 150 minutes per week; 5 days a week-30 minutes per day. Avoid red meat, fried foods. junk foods, sodas, sugary drinks, unhealthy snacking, alcohol and smoking.  Drink at least 48oz of water per day and monitor your carbohydrate intake daily.    MITRAL REGURGITATION -     Ambulatory referral to Cardiology    Patient has been counseled on age-appropriate routine health concerns for screening and prevention. These are reviewed and up-to-date. Referrals have been placed accordingly. Immunizations are  up-to-date or declined.    Subjective:   Chief Complaint  Patient presents with  . Follow-up    Pt. is here to follow up on HTN.    HPI Nichole Gibbs 63 y.o. female presents to office today for follow up.  has a past medical history of Anemia, Anxiety, Bipolar 1 disorder ([place]), Chest pain, Depression, GERD (gastroesophageal reflux disease), Hypertension, Memory loss, Mitral valve prolapse, Moderate mitral regurgitation, Osteoarthritis, Right patella fracture, and Rotator cuff syndrome.   Essential Hypertension Not well controlled. Taking amlodipine 2.5mg . Will increase to 5mg  today. Denies chest pain, shortness of breath, palpitations, lightheadedness, dizziness, headaches or BLE edema.  BP Readings from Last 3 Encounters:  02/24/19 (!) 146/70  10/03/18 134/68  06/24/18 (!) 142/84    Hyperlipidemia Patient presents for follow up to hyperlipidemia.  She is medication compliant taking atorvastatin 20 mg daily. . She is not diet compliant and denies statin intolerance including myalgias.  Lab Results  Component Value Date   CHOL 264 (H) 02/04/2019   Lab Results  Component Value Date   HDL 138 02/04/2019   Lab Results  Component Value Date   LDLCALC 112 (H) 02/04/2019   Lab Results  Component Value Date   TRIG 69 02/04/2019   Lab Results  Component Value Date   CHOLHDL 1.9 02/04/2019   Review of Systems  Constitutional: Negative for fever, malaise/fatigue and weight loss.       Halitosis  HENT: Negative.  Negative for nosebleeds.   Eyes: Negative.  Negative for blurred vision, double vision and photophobia.  Respiratory: Negative.  Negative for cough and shortness of breath.   Cardiovascular: Negative.  Negative for chest  pain, palpitations and leg swelling.  Gastrointestinal: Positive for heartburn. Negative for nausea and vomiting.  Musculoskeletal: Negative.  Negative for myalgias.  Neurological: Negative.  Negative for dizziness, focal weakness, seizures and  headaches.  Psychiatric/Behavioral: Positive for depression and memory loss. Negative for suicidal ideas. The patient is nervous/anxious.        Bipolar depression    Past Medical History:  Diagnosis Date  . Anemia   . Anxiety   . Bipolar 1 disorder ([place])   . Chest pain    denies at this time 10/7  . Depression   . GERD (gastroesophageal reflux disease)   . Hypertension   . Memory loss    pt states mild memory loss  . Mitral valve prolapse    cardiologist-   dr Burt Knack  . Moderate mitral regurgitation   . Osteoarthritis   . Right patella fracture   . Rotator cuff syndrome    right side    Past Surgical History:  Procedure Laterality Date  . ANTERIOR CERVICAL DECOMP/DISCECTOMY FUSION  05-19-2009   C4 --  C7  and C5 corpectomy  . CARDIOVASCULAR STRESS TEST  11-20-2012  dr cooper   normal perfusion study/  no ischemia/  ef 62%  . CAUTERIZATION POST TONSILLECTOMY  09-25-2003  . CESAREAN SECTION    . PATELLECTOMY Right 04/03/2014   Procedure: PATELLECTOMY;  Surgeon: Sydnee Cabal, MD;  Location: [facility];  Service: Orthopedics;  Laterality: Right;  . RE-EXPLORATION ANTERIOR CERVICAL WOUND AND EVACUATION HEMATOMA  05-19-2009  . TONSILLECTOMY  09-07-2003  . TRANSTHORACIC ECHOCARDIOGRAM  01-07-2014   grade II diastolic dysfunction/  ef 55-60%/  mild late systolic mitral valve prolapse of anterior leaflet/  moderate MR    Family History  Problem Relation Age of Onset  . Heart disease Mother   . Heart attack Father   . Colon polyps Father   . Skin cancer Father   . Esophageal cancer Maternal Uncle   . Other Maternal Aunt        stomcah polyps  . Bone cancer Paternal Grandfather   . Uterine cancer Paternal Grandmother   . Liver cancer Paternal Uncle   . Cirrhosis Paternal Uncle     Social History Reviewed with no changes to be made today.   Outpatient Medications Prior to Visit  Medication Sig Dispense Refill  . atorvastatin (LIPITOR) 20 MG tablet  Take 1 tablet (20 mg total) by mouth daily. 90 tablet 3  . Blood Pressure Monitor DEVI Please provide patient with insurance approved blood pressure monitor 1 Device 0  . buPROPion (WELLBUTRIN XL) 150 MG 24 hr tablet TK 1 T PO QAM    . fluticasone (FLONASE) 50 MCG/ACT nasal spray SHAKE LIQUID AND USE 1 SPRAY IN EACH NOSTRIL EVERY DAY 16 g 5  . gabapentin (NEURONTIN) 300 MG capsule Take 300 mg by mouth 3 (three) times daily.    Marland Kitchen lamoTRIgine (LAMICTAL) 150 MG tablet lamotrigine 150 mg tablet daily    . linaclotide (LINZESS) 72 MCG capsule Take 72 mcg by mouth daily before breakfast.    . MILK THISTLE PO Take by mouth.    . pantoprazole (PROTONIX) 40 MG tablet TAKE 1 TABLET BY MOUTH EVERY DAY 90 tablet 1  . albuterol (VENTOLIN HFA) 108 (90 Base) MCG/ACT inhaler INHALE 2 PUFFS INTO THE LUNGS EVERY 6 HOURS AS NEEDED FOR WHEEZING OR SHORTNESS OF BREATH 54 g 1  . amLODipine (NORVASC) 2.5 MG tablet TAKE 1 TABLET BY MOUTH EVERY DAY 90  tablet 1  . Azelastine HCl 137 MCG/SPRAY SOLN Place 2 sprays into the nose 2 (two) times daily. (Patient not taking: Reported on 01/20/2019) 30 mL 3   No facility-administered medications prior to visit.     Allergies  Allergen Reactions  . Tizanidine     Pt felt paranoid, homicidal, sweating, could not sleep   . Acetaminophen     Upsets stomach   . Ibuprofen Other (See Comments)    Pt states it causes stomach pains d/t hx of ulcers  . Lactose Intolerance (Gi) Other (See Comments)    Gas/Bloating/upset stomach       Objective:    BP (!) 146/70 (BP Location: Left Arm, Patient Position: Sitting, Cuff Size: Normal)   Pulse 68   Temp 99.2 F (37.3 C) (Oral)   Ht 5\' 4"  (1.626 m)   Wt 141 lb 12.8 oz (64.3 kg)   SpO2 100%   BMI 24.34 kg/m  Wt Readings from Last 3 Encounters:  02/24/19 141 lb 12.8 oz (64.3 kg)  10/03/18 136 lb 6.4 oz (61.9 kg)  06/24/18 133 lb 9.6 oz (60.6 kg)    Physical Exam Vitals signs and nursing note reviewed.  Constitutional:       Appearance: She is well-developed.  HENT:     Head: Normocephalic and atraumatic.  Neck:     Musculoskeletal: Normal range of motion.  Cardiovascular:     Rate and Rhythm: Normal rate and regular rhythm.     Heart sounds: Normal heart sounds. No murmur. No friction rub. No gallop.   Pulmonary:     Effort: Pulmonary effort is normal. No tachypnea or respiratory distress.     Breath sounds: Normal breath sounds. No decreased breath sounds, wheezing, rhonchi or rales.  Chest:     Chest wall: No tenderness.  Abdominal:     General: Bowel sounds are normal.     Palpations: Abdomen is soft.  Musculoskeletal: Normal range of motion.  Skin:    General: Skin is warm and dry.  Neurological:     Mental Status: She is alert and oriented to person, place, and time.     Coordination: Coordination normal.  Psychiatric:        Behavior: Behavior normal. Behavior is cooperative.        Thought Content: Thought content normal.        Judgment: Judgment normal.          Patient has been counseled extensively about nutrition and exercise as well as the importance of adherence with medications and regular follow-up. The patient was given clear instructions to go to ER or return to medical center if symptoms don't improve, worsen or new problems develop. The patient verbalized understanding.   Follow-up: Return for PAP SMEAR.   Gildardo Pounds, FNP-BC [facility] and [place], [place]   02/24/2019, 10:35 PM

## 2019-02-24 NOTE — Patient Instructions (Signed)
Halitosis Halitosis is bad breath. Halitosis may be caused by:  Foods and beverages.  Poor mouth care (oral hygiene).  Medical conditions, such as sinus infections, mouth infections, diabetes, and liver or kidney disease.  Medicines that dry out your mouth.  Smoking. Follow these instructions at home: Oral hygiene         Floss at least once a day. Ask your dentist to show you the best way to floss.  Brush your teeth at least two times a day. Use the toothpaste that your dentist recommends. Ask your dentist to show you the best way to brush your teeth.  Brush your tongue when you brush your teeth. This may help to improve your breath.  Rinse your mouth at least once a day. Use the mouthwash that your dentist recommends.  Visit your dentist for a routine cleaning at least twice a year. Eating and drinking  Drink enough fluid to keep your urine pale yellow.  Eat foods that help to keep your teeth clean, such as carrots and celery.  Avoid foods and drinks that can lead to bad breath, such as: ? Garlic. ? Onions. ? Fish. ? Meats. ? Coffee. ? Alcohol. General instructions  Do not use any products that contain nicotine or tobacco, such as cigarettes and e-cigarettes. These products can make bad breath worse. If you need help quitting, ask your health care provider.  If you wear mouth devices such as dentures or a retainer, make sure you wear and clean your device properly.  If you have a dry mouth, try chewing gum or mints that do not contain sugar. Chewing gum or sucking on mints can trigger saliva production. Contact a health care provider if:  You develop new symptoms, such as bleeding gums or pain.  Your symptoms get worse or do not improve with home care. Summary  Halitosis is bad breath. This has many possible causes, such as certain foods and beverages.  Make sure you have good oral hygiene. Also keep any oral devices, such as retainers, clean.  Drink  enough fluid to keep your urine pale yellow.  Avoid foods and drinks that can lead to bad breath, such as garlic and onions.  Do not use any products that contain nicotine or tobacco, such as cigarettes and e-cigarettes. This information is not intended to replace advice given to you by your health care provider. Make sure you discuss any questions you have with your health care provider. Document Released: 07/20/2004 Document Revised: 06/14/2017 Document Reviewed: 03/27/2017 Elsevier Patient Education  2020 [employer].

## 2019-03-17 DIAGNOSIS — G894 Chronic pain syndrome: Secondary | ICD-10-CM | POA: Diagnosis not present

## 2019-03-24 ENCOUNTER — Ambulatory Visit (HOSPITAL_BASED_OUTPATIENT_CLINIC_OR_DEPARTMENT_OTHER): Payer: Medicare Other | Admitting: Nurse Practitioner

## 2019-03-24 ENCOUNTER — Other Ambulatory Visit (HOSPITAL_COMMUNITY)
Admission: RE | Admit: 2019-03-24 | Discharge: 2019-03-24 | Disposition: A | Discharge status: Home / Self Care | Payer: Medicare Other | Source: Ambulatory Visit | Attending: Nurse Practitioner | Admitting: Nurse Practitioner

## 2019-03-24 ENCOUNTER — Other Ambulatory Visit: Payer: Self-pay

## 2019-03-24 ENCOUNTER — Encounter: Payer: Self-pay | Admitting: Nurse Practitioner

## 2019-03-24 VITALS — BP 147/78 | HR 78 | Temp 99.1°F | Ht 64.0 in | Wt 140.0 lb

## 2019-03-24 DIAGNOSIS — F17200 Nicotine dependence, unspecified, uncomplicated: Secondary | ICD-10-CM | POA: Diagnosis not present

## 2019-03-24 DIAGNOSIS — Z124 Encounter for screening for malignant neoplasm of cervix: Secondary | ICD-10-CM

## 2019-03-24 DIAGNOSIS — Z1151 Encounter for screening for human papillomavirus (HPV): Secondary | ICD-10-CM | POA: Insufficient documentation

## 2019-03-24 DIAGNOSIS — F419 Anxiety disorder, unspecified: Secondary | ICD-10-CM

## 2019-03-24 MED ORDER — HYDROXYZINE HCL 25 MG PO TABS: 25.0000 mg | ORAL_TABLET | Freq: Three times a day (TID) | ORAL | 1 refills | Status: AC | PRN
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
## 2019-03-25 LAB — CERVICOVAGINAL ANCILLARY ONLY
Bacterial Vaginitis (gardnerella): NEGATIVE
Candida Glabrata: NEGATIVE
Candida Vaginitis: NEGATIVE
Chlamydia: NEGATIVE
Molecular Disclaimer: NEGATIVE
Molecular Disclaimer: NEGATIVE
Molecular Disclaimer: NEGATIVE
Molecular Disclaimer: NEGATIVE
Molecular Disclaimer: NORMAL
Molecular Disclaimer: NORMAL
Neisseria Gonorrhea: NEGATIVE
Trichomonas: NEGATIVE

## 2019-03-26 ENCOUNTER — Telehealth: Payer: Self-pay | Admitting: Family Medicine
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
## 2019-03-28 LAB — CYTOLOGY - PAP
Adequacy: ABSENT
Diagnosis: NEGATIVE
High risk HPV: NEGATIVE
Molecular Disclaimer: 56
Molecular Disclaimer: NORMAL

## 2019-04-01 ENCOUNTER — Telehealth: Payer: Self-pay

## 2019-04-01 NOTE — Telephone Encounter (Signed)
-----   Message from [place] sent at 03/13/2019  9:03 AM EDT -----  Patient has not been seen since 2017. PCP sent  a referral to be seen :Mitral valve insufficiency and aortic valve insufficiency.  Would Dr Burt Knack want to see her, or schedule with another Cardiologist? I just wanted to know before I call the patient and offer appointments.  Thanks

## 2019-04-01 NOTE — Telephone Encounter (Signed)
Referral received from PCP. Called patient to arrange overdue visit with Dr. Burt Knack. Left message to call back.

## 2019-04-02 NOTE — Telephone Encounter (Signed)
The patient states she is relocating and will not need cardiac services from Dr. Burt Knack. She states she will call when she gets a new provider to request records. She was grateful for call.

## 2019-04-07 ENCOUNTER — Encounter: Payer: Medicare Other | Admitting: Family Medicine

## 2019-04-11 ENCOUNTER — Other Ambulatory Visit: Payer: Self-pay | Admitting: Nurse Practitioner

## 2019-04-11 DIAGNOSIS — F17200 Nicotine dependence, unspecified, uncomplicated: Secondary | ICD-10-CM

## 2019-05-08 ENCOUNTER — Ambulatory Visit: Payer: Medicare Other

## 2019-05-08 ENCOUNTER — Ambulatory Visit: Payer: Medicare Other | Admitting: Nurse Practitioner

## 2019-05-26 ENCOUNTER — Other Ambulatory Visit: Payer: Self-pay | Admitting: Nurse Practitioner

## 2019-05-26 DIAGNOSIS — I10 Essential (primary) hypertension: Secondary | ICD-10-CM

## 2019-06-03 DIAGNOSIS — K068 Other specified disorders of gingiva and edentulous alveolar ridge: Secondary | ICD-10-CM | POA: Diagnosis not present

## 2019-07-18 DIAGNOSIS — K068 Other specified disorders of gingiva and edentulous alveolar ridge: Secondary | ICD-10-CM | POA: Diagnosis not present

## 2019-07-18 DIAGNOSIS — F319 Bipolar disorder, unspecified: Secondary | ICD-10-CM | POA: Diagnosis not present

## 2019-07-18 DIAGNOSIS — Z09 Encounter for follow-up examination after completed treatment for conditions other than malignant neoplasm: Secondary | ICD-10-CM | POA: Diagnosis not present

## 2019-07-18 DIAGNOSIS — F17200 Nicotine dependence, unspecified, uncomplicated: Secondary | ICD-10-CM | POA: Diagnosis not present

## 2019-07-18 DIAGNOSIS — I10 Essential (primary) hypertension: Secondary | ICD-10-CM | POA: Diagnosis not present

## 2019-07-18 DIAGNOSIS — Z79899 Other long term (current) drug therapy: Secondary | ICD-10-CM | POA: Diagnosis not present

## 2020-05-17 ENCOUNTER — Other Ambulatory Visit: Payer: Self-pay | Admitting: Nurse Practitioner

## 2020-05-17 DIAGNOSIS — F17200 Nicotine dependence, unspecified, uncomplicated: Secondary | ICD-10-CM

## 2020-05-17 NOTE — Telephone Encounter (Signed)
Requested medication (s) are due for refill today: no  Requested medication (s) are on the active medication list: yes  Last refill: 03/24/2019  Future visit scheduled: no  Notes to clinic:  overdue for follow up Attempt to contact patient and number could not be reached at this time    Requested Prescriptions  Pending Prescriptions Disp Refills   albuterol (VENTOLIN HFA) 108 (90 Base) MCG/ACT inhaler [Pharmacy Med Name: ALBUTEROL HFA INH(200 PUFFS)18GM] 54 g 1    Sig: INHALE 2 PUFFS INTO THE LUNGS EVERY 6 HOURS AS NEEDED FOR WHEEZING OR SHORTNESS OF BREATH      Pulmonology:  Beta Agonists Failed - 05/17/2020 12:14 PM      Failed - One inhaler should last at least one month. If the patient is requesting refills earlier, contact the patient to check for uncontrolled symptoms.      Failed - Valid encounter within last 12 months    Recent Outpatient Visits           1 year ago Encounter for Papanicolaou smear for cervical cancer screening   [place], Vernia Buff, NP   1 year ago Essential hypertension   [place], Zelda W, NP   1 year ago Encounter to establish care   [place], Zelda W, NP

## 2020-06-03 ENCOUNTER — Other Ambulatory Visit: Payer: Self-pay | Admitting: Family Medicine

## 2020-06-03 DIAGNOSIS — I10 Essential (primary) hypertension: Secondary | ICD-10-CM

## 2020-06-03 NOTE — Telephone Encounter (Signed)
Requested medication (s) are due for refill today: no  Requested medication (s) are on the active medication list: yes   Last refill:  05/21/2020  Future visit scheduled: no  Notes to clinic:  overdue for follow up appt Attempted to contact patient but the customer was unavailable.    Requested Prescriptions  Pending Prescriptions Disp Refills   amLODipine (NORVASC) 5 MG tablet [Pharmacy Med Name: AMLODIPINE BESYLATE 5MG  TABLETS] 90 tablet 0    Sig: TAKE 1 TABLET(5 MG) BY MOUTH DAILY      Cardiovascular:  Calcium Channel Blockers Failed - 06/03/2020  9:59 AM      Failed - Last BP in normal range    BP Readings from Last 1 Encounters:  03/24/19 (!) 147/78          Failed - Valid encounter within last 6 months    Recent Outpatient Visits           1 year ago Encounter for Papanicolaou smear for cervical cancer screening   [place], Vernia Buff, NP   1 year ago Essential hypertension   [place], Vernia Buff, NP   1 year ago Encounter to establish care   [place], Vernia Buff, NP
